# Patient Record
Sex: Male | Born: 1975 | Race: White | Hispanic: No | Marital: Married | Smoking: Never smoker
Health system: Southern US, Community
[De-identification: ages and names within clinical notes are randomized; demographics above are authoritative.]

## PROBLEM LIST (undated history)

## (undated) DIAGNOSIS — R519 Headache, unspecified: Secondary | ICD-10-CM

## (undated) DIAGNOSIS — G4730 Sleep apnea, unspecified: Secondary | ICD-10-CM

## (undated) DIAGNOSIS — K760 Fatty (change of) liver, not elsewhere classified: Secondary | ICD-10-CM

## (undated) DIAGNOSIS — M722 Plantar fascial fibromatosis: Secondary | ICD-10-CM

## (undated) DIAGNOSIS — Z87442 Personal history of urinary calculi: Secondary | ICD-10-CM

## (undated) DIAGNOSIS — F32A Depression, unspecified: Secondary | ICD-10-CM

## (undated) DIAGNOSIS — Z973 Presence of spectacles and contact lenses: Secondary | ICD-10-CM

## (undated) DIAGNOSIS — K219 Gastro-esophageal reflux disease without esophagitis: Secondary | ICD-10-CM

## (undated) DIAGNOSIS — N200 Calculus of kidney: Secondary | ICD-10-CM

## (undated) DIAGNOSIS — F329 Major depressive disorder, single episode, unspecified: Secondary | ICD-10-CM

## (undated) DIAGNOSIS — F319 Bipolar disorder, unspecified: Secondary | ICD-10-CM

## (undated) HISTORY — DX: Depression, unspecified: F32.A

## (undated) HISTORY — DX: Plantar fascial fibromatosis: M72.2

## (undated) HISTORY — DX: Calculus of kidney: N20.0

## (undated) HISTORY — DX: Major depressive disorder, single episode, unspecified: F32.9

## (undated) HISTORY — DX: Fatty (change of) liver, not elsewhere classified: K76.0

## (undated) HISTORY — PX: WISDOM TOOTH EXTRACTION: SHX21

## (undated) HISTORY — DX: Bipolar disorder, unspecified: F31.9

## (undated) HISTORY — PX: COLONOSCOPY: SHX174

---

## 2006-07-19 ENCOUNTER — Emergency Department: Payer: Self-pay | Admitting: Emergency Medicine

## 2006-07-20 ENCOUNTER — Emergency Department: Payer: Self-pay | Admitting: Emergency Medicine

## 2006-07-25 ENCOUNTER — Emergency Department: Payer: Self-pay | Admitting: Emergency Medicine

## 2009-11-03 ENCOUNTER — Ambulatory Visit: Payer: Self-pay | Admitting: Gastroenterology

## 2011-07-15 ENCOUNTER — Ambulatory Visit: Payer: Self-pay | Admitting: Family Medicine

## 2012-04-22 ENCOUNTER — Emergency Department: Payer: Self-pay | Admitting: *Deleted

## 2012-04-22 LAB — CBC
HCT: 46.5 % (ref 40.0–52.0)
HGB: 15.9 g/dL (ref 13.0–18.0)
MCH: 29.5 pg (ref 26.0–34.0)
MCV: 86 fL (ref 80–100)
RBC: 5.38 10*6/uL (ref 4.40–5.90)
RDW: 12.8 % (ref 11.5–14.5)
WBC: 11.4 10*3/uL — ABNORMAL HIGH (ref 3.8–10.6)

## 2012-04-22 LAB — BASIC METABOLIC PANEL
BUN: 11 mg/dL (ref 7–18)
Calcium, Total: 8.6 mg/dL (ref 8.5–10.1)
Creatinine: 0.94 mg/dL (ref 0.60–1.30)
EGFR (African American): >60
EGFR (Non-African Amer.): >60
Osmolality: 280 (ref 275–301)
Potassium: 3.8 mmol/L (ref 3.5–5.1)

## 2012-04-22 LAB — TROPONIN I: Troponin-I: <0.02 ng/mL

## 2012-04-22 LAB — CK TOTAL AND CKMB (NOT AT ARMC): CK-MB: 1.1 ng/mL (ref 0.5–3.6)

## 2012-04-23 LAB — HEPATIC FUNCTION PANEL A (ARMC)
Albumin: 4.3 g/dL (ref 3.4–5.0)
Bilirubin,Total: 0.7 mg/dL (ref 0.2–1.0)
SGOT(AST): 42 U/L — ABNORMAL HIGH (ref 15–37)
Total Protein: 7.5 g/dL (ref 6.4–8.2)

## 2012-04-23 LAB — CK TOTAL AND CKMB (NOT AT ARMC): CK, Total: 189 U/L (ref 35–232)

## 2012-04-23 LAB — TROPONIN I: Troponin-I: <0.02 ng/mL

## 2012-08-08 ENCOUNTER — Ambulatory Visit: Payer: Self-pay | Admitting: Orthopedic Surgery

## 2012-10-14 ENCOUNTER — Emergency Department: Payer: Self-pay | Admitting: Internal Medicine

## 2012-10-14 LAB — CBC
HCT: 43 % (ref 40.0–52.0)
HGB: 15.9 g/dL (ref 13.0–18.0)
MCH: 31.6 pg (ref 26.0–34.0)
MCHC: 36.8 g/dL — ABNORMAL HIGH (ref 32.0–36.0)
MCV: 85 fL (ref 80–100)
RBC: 5.04 10*6/uL (ref 4.40–5.90)
RDW: 13.1 % (ref 11.5–14.5)

## 2012-10-14 LAB — BASIC METABOLIC PANEL
Anion Gap: 9 (ref 7–16)
BUN: 23 mg/dL — ABNORMAL HIGH (ref 7–18)
Calcium, Total: 8.7 mg/dL (ref 8.5–10.1)
Creatinine: 1.11 mg/dL (ref 0.60–1.30)
EGFR (African American): >60
EGFR (Non-African Amer.): >60
Glucose: 115 mg/dL — ABNORMAL HIGH (ref 65–99)
Osmolality: 282 (ref 275–301)

## 2012-10-14 LAB — URINALYSIS, COMPLETE
Bilirubin,UR: NEGATIVE
Ketone: NEGATIVE
Leukocyte Esterase: NEGATIVE
Nitrite: NEGATIVE
Protein: NEGATIVE
RBC,UR: 4 /HPF (ref 0–5)
Specific Gravity: 1.021 (ref 1.003–1.030)

## 2012-11-09 ENCOUNTER — Ambulatory Visit: Payer: Self-pay | Admitting: Urology

## 2012-12-20 DIAGNOSIS — R39198 Other difficulties with micturition: Secondary | ICD-10-CM | POA: Insufficient documentation

## 2012-12-20 DIAGNOSIS — R3911 Hesitancy of micturition: Secondary | ICD-10-CM | POA: Insufficient documentation

## 2013-04-12 ENCOUNTER — Ambulatory Visit: Payer: Self-pay | Admitting: Orthopedic Surgery

## 2013-12-26 ENCOUNTER — Ambulatory Visit: Payer: Self-pay | Admitting: Physician Assistant

## 2015-04-11 DIAGNOSIS — N5082 Scrotal pain: Secondary | ICD-10-CM | POA: Insufficient documentation

## 2015-04-23 ENCOUNTER — Other Ambulatory Visit: Payer: Self-pay | Admitting: Urology

## 2015-04-23 DIAGNOSIS — N2889 Other specified disorders of kidney and ureter: Secondary | ICD-10-CM

## 2015-05-02 ENCOUNTER — Ambulatory Visit
Admission: RE | Admit: 2015-05-02 | Discharge: 2015-05-02 | Disposition: A | Discharge status: Home / Self Care | Payer: BLUE CROSS/BLUE SHIELD | Source: Ambulatory Visit | Attending: Urology | Admitting: Urology

## 2015-05-02 DIAGNOSIS — N281 Cyst of kidney, acquired: Secondary | ICD-10-CM | POA: Diagnosis not present

## 2015-05-02 DIAGNOSIS — N2889 Other specified disorders of kidney and ureter: Secondary | ICD-10-CM

## 2015-05-02 MED ORDER — IOHEXOL 350 MG/ML SOLN
125.0000 mL | Freq: Once | INTRAVENOUS | Status: AC | PRN
  Administered 2015-05-02: 125 mL via INTRAVENOUS

## 2015-06-22 ENCOUNTER — Other Ambulatory Visit: Payer: Self-pay | Admitting: Family Medicine

## 2015-11-04 ENCOUNTER — Ambulatory Visit (INDEPENDENT_AMBULATORY_CARE_PROVIDER_SITE_OTHER): Payer: BLUE CROSS/BLUE SHIELD | Admitting: Family Medicine

## 2015-11-04 ENCOUNTER — Encounter: Payer: Self-pay | Admitting: Family Medicine

## 2015-11-04 VITALS — BP 114/75 | HR 70 | Temp 98.2°F | Ht 68.2 in | Wt 195.0 lb

## 2015-11-04 DIAGNOSIS — N200 Calculus of kidney: Secondary | ICD-10-CM | POA: Diagnosis not present

## 2015-11-04 DIAGNOSIS — Z113 Encounter for screening for infections with a predominantly sexual mode of transmission: Secondary | ICD-10-CM

## 2015-11-04 DIAGNOSIS — Z23 Encounter for immunization: Secondary | ICD-10-CM

## 2015-11-04 DIAGNOSIS — Z0000 Encounter for general adult medical examination without abnormal findings: Secondary | ICD-10-CM | POA: Diagnosis not present

## 2015-11-04 DIAGNOSIS — F329 Major depressive disorder, single episode, unspecified: Secondary | ICD-10-CM | POA: Diagnosis not present

## 2015-11-04 DIAGNOSIS — F32A Depression, unspecified: Secondary | ICD-10-CM | POA: Insufficient documentation

## 2015-11-04 LAB — URINALYSIS, ROUTINE W REFLEX MICROSCOPIC
Bilirubin, UA: NEGATIVE
Glucose, UA: NEGATIVE
KETONES UA: NEGATIVE
Leukocytes, UA: NEGATIVE
NITRITE UA: NEGATIVE
Protein, UA: NEGATIVE
RBC, UA: NEGATIVE
SPEC GRAV UA: 1.025 (ref 1.005–1.030)
UUROB: 0.2 mg/dL (ref 0.2–1.0)
pH, UA: 6.5 (ref 5.0–7.5)

## 2015-11-04 MED ORDER — SUMATRIPTAN SUCCINATE 25 MG PO TABS: 25.0000 mg | ORAL_TABLET | ORAL | Status: DC | PRN

## 2015-11-04 MED ORDER — OMEPRAZOLE 20 MG PO CPDR: 20.0000 mg | DELAYED_RELEASE_CAPSULE | Freq: Every day | ORAL | Status: DC

## 2015-11-04 MED ORDER — MELOXICAM 15 MG PO TABS: 15.0000 mg | ORAL_TABLET | Freq: Every day | ORAL | Status: DC

## 2015-11-04 MED ORDER — OXYCODONE-ACETAMINOPHEN 5-325 MG PO TABS: 1.0000 | ORAL_TABLET | ORAL | Status: DC | PRN

## 2015-11-04 MED ORDER — CLONAZEPAM 1 MG PO TABS: 1.0000 mg | ORAL_TABLET | Freq: Two times a day (BID) | ORAL | Status: DC | PRN

## 2015-11-04 MED ORDER — AZELASTINE HCL 0.05 % OP SOLN: OPHTHALMIC | Status: DC

## 2015-11-04 NOTE — Addendum Note (Signed)
Addended by: Lurlean HornsWILSON, NANCY H on: 11/04/2015 02:54 PM   Modules accepted: Orders, SmartSet

## 2015-11-04 NOTE — Assessment & Plan Note (Signed)
Patient will have Percocet on hand in case gets a flare of his kidney stones

## 2015-11-04 NOTE — Progress Notes (Signed)
BP 114/75 mmHg  Pulse 70  Temp(Src) 98.2 F (36.8 C)  Ht 5' 8.2" (1.732 m)  Wt 195 lb (88.451 kg)  BMI 29.49 kg/m2  SpO2 97%   Subjective:    Patient ID: Lance Walsh, male    DOB: 1976-04-15, 39 y.o.   MRN: [id number]  HPI: Lance Walsh is a 39 y.o. male  Chief Complaint  Patient presents with  . Annual Exam   patient all in all doing well having some anxiety concerns takes occasional tramadol for when his back hurts along with meloxicam and has run out of meloxicam not tramadol Imitrex does pretty decent for headaches Takes omeprazole for reflux doing okay Patient has a few Klonopin left over takes it rarely but seems to help.   Relevant past medical, surgical, family and social history reviewed and updated as indicated. Interim medical history since our last visit reviewed. Allergies and medications reviewed and updated.  Review of Systems  Constitutional: Negative.   HENT: Negative.   Eyes: Negative.   Respiratory: Negative.   Cardiovascular: Negative.   Gastrointestinal: Negative.   Endocrine: Negative.   Genitourinary: Negative.   Musculoskeletal: Negative.   Skin: Negative.   Allergic/Immunologic: Negative.   Neurological: Negative.   Hematological: Negative.   Psychiatric/Behavioral: Negative.     Per HPI unless specifically indicated above     Objective:    BP 114/75 mmHg  Pulse 70  Temp(Src) 98.2 F (36.8 C)  Ht 5' 8.2" (1.732 m)  Wt 195 lb (88.451 kg)  BMI 29.49 kg/m2  SpO2 97%  Wt Readings from Last 3 Encounters:  11/04/15 195 lb (88.451 kg)  10/17/14 187 lb (84.823 kg)    Physical Exam  Constitutional: He is oriented to person, place, and time. He appears well-developed and well-nourished.  HENT:  Head: Normocephalic.  Right Ear: External ear normal.  Left Ear: External ear normal.  Nose: Nose normal.  Eyes: Conjunctivae and EOM are normal. Pupils are equal, round, and reactive to light.  Neck: Normal range of motion. Neck supple.  No thyromegaly present.  Cardiovascular: Normal rate, regular rhythm, normal heart sounds and intact distal pulses.   Pulmonary/Chest: Effort normal and breath sounds normal.  Abdominal: Soft. Bowel sounds are normal. There is no splenomegaly or hepatomegaly.  Genitourinary: Penis normal.  Musculoskeletal: Normal range of motion.  Lymphadenopathy:    He has no cervical adenopathy.  Neurological: He is alert and oriented to person, place, and time. He has normal reflexes.  Skin: Skin is warm and dry.  Psychiatric: He has a normal mood and affect. His behavior is normal. Judgment and thought content normal.    Results for orders placed or performed in visit on 67/89/38  Basic metabolic panel  Result Value Ref Range   Glucose 115 (H) 65-99 mg/dL   BUN 23 (H) 7-18 mg/dL   Creatinine 1.11 0.60-1.30 mg/dL   Sodium 139 136-145 mmol/L   Potassium 3.8 3.5-5.1 mmol/L   Chloride 106 98-107 mmol/L   Co2 24 21-32 mmol/L   Calcium, Total 8.7 8.5-10.1 mg/dL   Osmolality 282 275-301   Anion Gap 9 7-16   EGFR (African American) >60    EGFR (Non-African Amer.) >60   CBC  Result Value Ref Range   WBC 14.8 (H) 3.8-10.6 x10 3/mm 3   RBC 5.04 4.40-5.90 x10 6/mm 3   HGB 15.9 13.0-18.0 g/dL   HCT 43.0 40.0-52.0 %   MCV 85 80-100 fL   MCH 31.6 26.0-34.0 pg  MCHC 36.8 (H) 32.0-36.0 g/dL   RDW 13.1 11.5-14.5 %   Platelet 230 150-440 x10 3/mm 3  Urinalysis, Complete  Result Value Ref Range   Color - urine Yellow    Clarity - urine Clear    Glucose,UR Negative 0-75 mg/dL   Bilirubin,UR Negative NEGATIVE   Ketone Negative NEGATIVE   Specific Gravity 1.021 1.003-1.030   Blood 2+ NEGATIVE   Ph 6.0 4.5-8.0   Protein Negative NEGATIVE   Nitrite Negative NEGATIVE   Leukocyte Esterase Negative NEGATIVE   RBC,UR 4 /HPF 0-5 /HPF   WBC UR <1 /HPF 0-5 /HPF   Bacteria TRACE NONE SEEN   Squamous Epithelial <1 /HPF    Mucous PRESENT       Assessment & Plan:   Problem List Items Addressed This  Visit      Genitourinary   Calculus of kidney    Patient will have Percocet on hand in case gets a flare of his kidney stones      Relevant Medications   oxyCODONE-acetaminophen (PERCOCET/ROXICET) 5-325 MG tablet     Other   Depression    Other Visit Diagnoses    Routine general medical examination at a health care facility    -  Primary    Relevant Orders    CBC with Differential/Platelet    Comprehensive metabolic panel    Lipid Panel w/o Chol/HDL Ratio    TSH    Urinalysis, Routine w reflex microscopic (not at [facility])    Routine screening for STI (sexually transmitted infection)        Relevant Orders    HIV antibody        Follow up plan: Return in about 6 months (around 05/04/2016) for Med check follow-up.

## 2015-11-05 ENCOUNTER — Telehealth: Payer: Self-pay | Admitting: Family Medicine

## 2015-11-05 LAB — COMPREHENSIVE METABOLIC PANEL
A/G RATIO: 2.2 (ref 1.1–2.5)
ALT: 24 IU/L (ref 0–44)
AST: 19 IU/L (ref 0–40)
Albumin: 4.7 g/dL (ref 3.5–5.5)
Alkaline Phosphatase: 55 IU/L (ref 39–117)
BUN/Creatinine Ratio: 17 (ref 8–19)
BUN: 15 mg/dL (ref 6–20)
Bilirubin Total: 0.7 mg/dL (ref 0.0–1.2)
CALCIUM: 9.4 mg/dL (ref 8.7–10.2)
CO2: 27 mmol/L (ref 18–29)
Chloride: 100 mmol/L (ref 97–106)
Creatinine, Ser: 0.88 mg/dL (ref 0.76–1.27)
GFR calc non Af Amer: 108 mL/min/{1.73_m2} (ref >59)
GFR, EST AFRICAN AMERICAN: 125 mL/min/{1.73_m2} (ref >59)
Globulin, Total: 2.1 g/dL (ref 1.5–4.5)
Glucose: 88 mg/dL (ref 65–99)
POTASSIUM: 4.4 mmol/L (ref 3.5–5.2)
Sodium: 140 mmol/L (ref 136–144)
TOTAL PROTEIN: 6.8 g/dL (ref 6.0–8.5)

## 2015-11-05 LAB — CBC WITH DIFFERENTIAL/PLATELET
BASOS: 0 %
Basophils Absolute: 0 10*3/uL (ref 0.0–0.2)
EOS (ABSOLUTE): 0.2 10*3/uL (ref 0.0–0.4)
EOS: 3 %
HEMATOCRIT: 44.5 % (ref 37.5–51.0)
Hemoglobin: 15.3 g/dL (ref 12.6–17.7)
IMMATURE GRANULOCYTES: 0 %
Immature Grans (Abs): 0 10*3/uL (ref 0.0–0.1)
Lymphocytes Absolute: 2.5 10*3/uL (ref 0.7–3.1)
Lymphs: 34 %
MCH: 29.4 pg (ref 26.6–33.0)
MCHC: 34.4 g/dL (ref 31.5–35.7)
MCV: 85 fL (ref 79–97)
Monocytes Absolute: 0.6 10*3/uL (ref 0.1–0.9)
Monocytes: 8 %
NEUTROS PCT: 55 %
Neutrophils Absolute: 3.9 10*3/uL (ref 1.4–7.0)
Platelets: 263 10*3/uL (ref 150–379)
RBC: 5.21 x10E6/uL (ref 4.14–5.80)
RDW: 13 % (ref 12.3–15.4)
WBC: 7.3 10*3/uL (ref 3.4–10.8)

## 2015-11-05 LAB — LIPID PANEL W/O CHOL/HDL RATIO
Cholesterol, Total: 234 mg/dL — ABNORMAL HIGH (ref 100–199)
HDL: 52 mg/dL (ref >39)
LDL Calculated: 166 mg/dL — ABNORMAL HIGH (ref 0–99)
Triglycerides: 80 mg/dL (ref 0–149)
VLDL CHOLESTEROL CAL: 16 mg/dL (ref 5–40)

## 2015-11-05 LAB — HIV ANTIBODY (ROUTINE TESTING W REFLEX): HIV SCREEN 4TH GENERATION: NONREACTIVE

## 2015-11-05 LAB — TSH: TSH: 2.11 u[IU]/mL (ref 0.450–4.500)

## 2015-11-05 NOTE — Telephone Encounter (Signed)
Phone call Discussed with patient elevated Konrad DoloresLester all patient will do better with diet and exercise nutrition

## 2015-11-05 NOTE — Telephone Encounter (Signed)
-----   Message from Lurlean HornsNancy H Wilson, CMA sent at 11/05/2015  5:12 PM EST ----- labs

## 2016-01-05 ENCOUNTER — Other Ambulatory Visit: Payer: Self-pay | Admitting: Family Medicine

## 2016-01-05 ENCOUNTER — Other Ambulatory Visit: Payer: Self-pay

## 2016-01-05 MED ORDER — TRAMADOL HCL 50 MG PO TABS: ORAL_TABLET | ORAL | Status: DC

## 2016-01-05 NOTE — Telephone Encounter (Signed)
[facility] requesting refill

## 2016-01-16 ENCOUNTER — Telehealth: Payer: Self-pay | Admitting: Family Medicine

## 2016-01-16 NOTE — Telephone Encounter (Signed)
Pt needs a new rx for imitrex and percocet. He stated that the dosage was wrond and would like a call back or have it corrected and sent to [facility] court.

## 2016-01-19 MED ORDER — SUMATRIPTAN SUCCINATE 100 MG PO TABS: 100.0000 mg | ORAL_TABLET | ORAL | Status: DC | PRN

## 2016-01-19 NOTE — Telephone Encounter (Signed)
Call pt 

## 2016-02-06 ENCOUNTER — Other Ambulatory Visit: Payer: Self-pay | Admitting: Family Medicine

## 2016-02-09 ENCOUNTER — Other Ambulatory Visit: Payer: Self-pay | Admitting: Family Medicine

## 2016-02-09 NOTE — Telephone Encounter (Signed)
Call tell too soon for tramadol refill should last several mo. Use Tylenol, Advil, or Aleve

## 2016-05-04 ENCOUNTER — Ambulatory Visit: Payer: BLUE CROSS/BLUE SHIELD | Admitting: Family Medicine

## 2016-06-18 ENCOUNTER — Other Ambulatory Visit: Payer: Self-pay | Admitting: Family Medicine

## 2016-06-18 NOTE — Telephone Encounter (Signed)
Gave enough to get him through until Dr. Dossie Arbourrissman comes back

## 2016-08-26 ENCOUNTER — Other Ambulatory Visit: Payer: Self-pay

## 2016-08-26 MED ORDER — MELOXICAM 15 MG PO TABS: 15.0000 mg | ORAL_TABLET | Freq: Every day | ORAL | 0 refills | Status: DC

## 2016-10-05 ENCOUNTER — Encounter: Payer: Self-pay | Admitting: Family Medicine

## 2016-10-05 ENCOUNTER — Ambulatory Visit (INDEPENDENT_AMBULATORY_CARE_PROVIDER_SITE_OTHER): Payer: BLUE CROSS/BLUE SHIELD | Admitting: Family Medicine

## 2016-10-05 VITALS — BP 101/69 | HR 67 | Temp 98.5°F | Wt 199.0 lb

## 2016-10-05 DIAGNOSIS — Z23 Encounter for immunization: Secondary | ICD-10-CM | POA: Diagnosis not present

## 2016-10-05 MED ORDER — EPINEPHRINE 0.3 MG/0.3ML IJ SOAJ: 0.3000 mg | Freq: Once | INTRAMUSCULAR | 2 refills | Status: AC

## 2016-10-05 MED ORDER — OMEPRAZOLE 20 MG PO CPDR: 20.0000 mg | DELAYED_RELEASE_CAPSULE | Freq: Two times a day (BID) | ORAL | 12 refills | Status: DC

## 2016-10-05 NOTE — Progress Notes (Signed)
BP 101/69   Pulse 67   Temp 98.5 F (36.9 C)   Wt 199 lb (90.3 kg)   SpO2 97%   BMI 30.08 kg/m    Subjective:    Patient ID: Lance Walsh, male    DOB: Sep 22, 1976, 40 y.o.   MRN: [id number]  HPI: Lance Championaul S Canedo is a 40 y.o. male  Chief Complaint  Patient presents with  . skin tag    right upper leg; has had similar ones in the past that were froze off   Reflux helped by taking Prilosec in the afternoons but does the best when he takes one in the morning and one in the evening. Wants to have medicine switch to that  Discussed pain management patient has used oxycodone in the past for intermittent kidney stones Reviewed with patient current climate and use of oxycodone patient will have to get any further prescriptions from urology Patient has substituted tramadol for kidney stones again will need tramadol prescriptions from urology He has used occasional tramadol for low back pain patient will need to avoid tramadol use meloxicam and Tylenol for back pain. If continued back pain may need further evaluation with orthopedics. Patient taking clonazepam rarely still has prescription left from last December Also needs refill on EpiPen Takes meloxicam which helps his fingers a great deal with mobility wants to continue. Migraine syndrome gotten worse still has meloxicam prescription Relevant past medical, surgical, family and social history reviewed and updated as indicated. Interim medical history since our last visit reviewed. Allergies and medications reviewed and updated.  Review of Systems  Constitutional: Negative.   Respiratory: Negative.   Cardiovascular: Negative.     Per HPI unless specifically indicated above     Objective:    BP 101/69   Pulse 67   Temp 98.5 F (36.9 C)   Wt 199 lb (90.3 kg)   SpO2 97%   BMI 30.08 kg/m   Wt Readings from Last 3 Encounters:  10/05/16 199 lb (90.3 kg)  11/04/15 195 lb (88.5 kg)  10/17/14 187 lb (84.8 kg)    Physical  Exam  Constitutional: He is oriented to person, place, and time. He appears well-developed and well-nourished. No distress.  HENT:  Head: Normocephalic and atraumatic.  Right Ear: Hearing normal.  Left Ear: Hearing normal.  Nose: Nose normal.  Eyes: Conjunctivae and lids are normal. Right eye exhibits no discharge. Left eye exhibits no discharge. No scleral icterus.  Cardiovascular: Normal rate, regular rhythm and normal heart sounds.   Pulmonary/Chest: Effort normal and breath sounds normal. No respiratory distress.  Musculoskeletal: Normal range of motion.  Neurological: He is alert and oriented to person, place, and time.  Skin: Skin is intact. No rash noted.  Psychiatric: He has a normal mood and affect. His speech is normal and behavior is normal. Judgment and thought content normal. Cognition and memory are normal.    Results for orders placed or performed in visit on 11/04/15  CBC with Differential/Platelet  Result Value Ref Range   WBC 7.3 3.4 - 10.8 x10E3/uL   RBC 5.21 4.14 - 5.80 x10E6/uL   Hemoglobin 15.3 12.6 - 17.7 g/dL   Hematocrit 13.044.5 86.537.5 - 51.0 %   MCV 85 79 - 97 fL   MCH 29.4 26.6 - 33.0 pg   MCHC 34.4 31.5 - 35.7 g/dL   RDW 78.413.0 69.612.3 - 29.515.4 %   Platelets 263 150 - 379 x10E3/uL   Neutrophils 55 %   Lymphs 34 %  Monocytes 8 %   Eos 3 %   Basos 0 %   Neutrophils Absolute 3.9 1.4 - 7.0 x10E3/uL   Lymphocytes Absolute 2.5 0.7 - 3.1 x10E3/uL   Monocytes Absolute 0.6 0.1 - 0.9 x10E3/uL   EOS (ABSOLUTE) 0.2 0.0 - 0.4 x10E3/uL   Basophils Absolute 0.0 0.0 - 0.2 x10E3/uL   Immature Granulocytes 0 %   Immature Grans (Abs) 0.0 0.0 - 0.1 x10E3/uL  Comprehensive metabolic panel  Result Value Ref Range   Glucose 88 65 - 99 mg/dL   BUN 15 6 - 20 mg/dL   Creatinine, Ser 1.610.88 0.76 - 1.27 mg/dL   GFR calc non Af Amer 108 >59 mL/min/1.73   GFR calc Af Amer 125 >59 mL/min/1.73   BUN/Creatinine Ratio 17 8 - 19   Sodium 140 136 - 144 mmol/L   Potassium 4.4 3.5 - 5.2  mmol/L   Chloride 100 97 - 106 mmol/L   CO2 27 18 - 29 mmol/L   Calcium 9.4 8.7 - 10.2 mg/dL   Total Protein 6.8 6.0 - 8.5 g/dL   Albumin 4.7 3.5 - 5.5 g/dL   Globulin, Total 2.1 1.5 - 4.5 g/dL   Albumin/Globulin Ratio 2.2 1.1 - 2.5   Bilirubin Total 0.7 0.0 - 1.2 mg/dL   Alkaline Phosphatase 55 39 - 117 IU/L   AST 19 0 - 40 IU/L   ALT 24 0 - 44 IU/L  Lipid Panel w/o Chol/HDL Ratio  Result Value Ref Range   Cholesterol, Total 234 (H) 100 - 199 mg/dL   Triglycerides 80 0 - 149 mg/dL   HDL 52 >09>39 mg/dL   VLDL Cholesterol Cal 16 5 - 40 mg/dL   LDL Calculated 604166 (H) 0 - 99 mg/dL  TSH  Result Value Ref Range   TSH 2.110 0.450 - 4.500 uIU/mL  Urinalysis, Routine w reflex microscopic (not at [facility])  Result Value Ref Range   Specific Gravity, UA 1.025 1.005 - 1.030   pH, UA 6.5 5.0 - 7.5   Color, UA Yellow Yellow   Appearance Ur Clear Clear   Leukocytes, UA Negative Negative   Protein, UA Negative Negative/Trace   Glucose, UA Negative Negative   Ketones, UA Negative Negative   RBC, UA Negative Negative   Bilirubin, UA Negative Negative   Urobilinogen, Ur 0.2 0.2 - 1.0 mg/dL   Nitrite, UA Negative Negative  HIV antibody  Result Value Ref Range   HIV Screen 4th Generation wRfx Non Reactive Non Reactive      Assessment & Plan:   Problem List Items Addressed This Visit    None    Visit Diagnoses    Needs flu shot    -  Primary       Follow up plan: Return in about 3 months (around 01/05/2017) for Physical Exam.

## 2016-10-08 ENCOUNTER — Other Ambulatory Visit: Payer: Self-pay | Admitting: Family Medicine

## 2016-10-08 NOTE — Telephone Encounter (Signed)
Your patient 

## 2016-12-22 ENCOUNTER — Encounter: Payer: BLUE CROSS/BLUE SHIELD | Admitting: Family Medicine

## 2016-12-29 ENCOUNTER — Ambulatory Visit (INDEPENDENT_AMBULATORY_CARE_PROVIDER_SITE_OTHER): Payer: BLUE CROSS/BLUE SHIELD | Admitting: Family Medicine

## 2016-12-29 ENCOUNTER — Encounter: Payer: Self-pay | Admitting: Family Medicine

## 2016-12-29 VITALS — BP 122/84 | HR 85 | Temp 98.0°F | Ht 70.0 in | Wt 202.0 lb

## 2016-12-29 DIAGNOSIS — Z23 Encounter for immunization: Secondary | ICD-10-CM

## 2016-12-29 DIAGNOSIS — G43909 Migraine, unspecified, not intractable, without status migrainosus: Secondary | ICD-10-CM

## 2016-12-29 DIAGNOSIS — Z13220 Encounter for screening for lipoid disorders: Secondary | ICD-10-CM | POA: Diagnosis not present

## 2016-12-29 DIAGNOSIS — Z0000 Encounter for general adult medical examination without abnormal findings: Secondary | ICD-10-CM

## 2016-12-29 DIAGNOSIS — N200 Calculus of kidney: Secondary | ICD-10-CM

## 2016-12-29 DIAGNOSIS — Z1329 Encounter for screening for other suspected endocrine disorder: Secondary | ICD-10-CM | POA: Diagnosis not present

## 2016-12-29 DIAGNOSIS — F3342 Major depressive disorder, recurrent, in full remission: Secondary | ICD-10-CM | POA: Diagnosis not present

## 2016-12-29 DIAGNOSIS — R5383 Other fatigue: Secondary | ICD-10-CM | POA: Insufficient documentation

## 2016-12-29 DIAGNOSIS — Z125 Encounter for screening for malignant neoplasm of prostate: Secondary | ICD-10-CM | POA: Diagnosis not present

## 2016-12-29 LAB — URINALYSIS, ROUTINE W REFLEX MICROSCOPIC
Bilirubin, UA: NEGATIVE
GLUCOSE, UA: NEGATIVE
Leukocytes, UA: NEGATIVE
Nitrite, UA: NEGATIVE
RBC UA: NEGATIVE
SPEC GRAV UA: 1.02 (ref 1.005–1.030)
Urobilinogen, Ur: 0.2 mg/dL (ref 0.2–1.0)
pH, UA: 7.5 (ref 5.0–7.5)

## 2016-12-29 MED ORDER — SUMATRIPTAN SUCCINATE 100 MG PO TABS: 100.0000 mg | ORAL_TABLET | ORAL | 12 refills | Status: DC | PRN

## 2016-12-29 MED ORDER — MELOXICAM 15 MG PO TABS: 15.0000 mg | ORAL_TABLET | Freq: Every day | ORAL | 3 refills | Status: DC

## 2016-12-29 MED ORDER — TRAZODONE HCL 50 MG PO TABS: 25.0000 mg | ORAL_TABLET | Freq: Every evening | ORAL | 3 refills | Status: DC | PRN

## 2016-12-29 NOTE — Progress Notes (Signed)
BP 122/84   Pulse 85   Temp 98 F (36.7 C) (Oral)   Ht 5\' 10"  (1.778 m)   Wt 202 lb (91.6 kg)   SpO2 99%   BMI 28.98 kg/m    Subjective:    Patient ID: Lance Walsh, male    DOB: 11/17/1976, 41 y.o.   MRN: [id number]  HPI: Lance Walsh is a 41 y.o. male  Chief Complaint  Patient presents with  . Annual Exam  . Fatigue   Fatigue Over the last year more tired than usual. More tired than those around him. Sleeps around 6 hours a night usually. Nyquil etc doesn't work. Snores. Has had a sleep study done a few years ago in the past without any findings. Never feels very rested in the morning gets going and feels okay - then gets tired again in the afternoon. Taking more naps after work which is unusual. Falls asleep right away in the evening. Once in a while feels very energized but cant tie it to anything making better or worse. Not sleepy but fatigued.  Kidney Calculi States he has been doing better. Reports he has modified his diet, has cut out caffeine and is drinking mostly only water now. Passing about 1 kidney calculi a month, but sometimes can go a month without one. Takes tramadol for this.  Migraines Patient states that his migraines have worsened over the past 6-8 months. Notes that previously he was only experiencing 1 minor migraine every couple weeks, but is now getting more severe migraines 2-3 times a week. Reports that he takes sumatriptan and BC powder without much relief.  Notes they have gotten worse over the last 6-8 months - 2-3 times a week - one usually lasts longer the others a couple longer - hot showers help - sumatriptan helps but takes a while  - cold rags help - light and noise makes worse  Depression Doing well - takes for stressful situations - gets irritated and agitated in these situations - takes klonapin every once in a while (once every couple weeks)  Chest Pain Sometimes dull, sometimes sharp - doesn't notice it happening at at certain times -  sometimes really fast sometimes lasts for 20-30 minutes - about 1 every couple weeks or so  Joint Pain in both hands Takes meloxicam  Asthma - given albuterol inhaler from urgent care  Medications Tramadol - as needed - 3-4 days out of the month  - doesn't make him tired unless he takes it multiple times in a row Omeprazole - twice a day flonase and allegra every day meloxicam every day for joint paint in hands Sumatriptan when he has a migraine  Family History Same  No surgeries, hospitalizations since last time he was   Allergies - nothing new   Relevant past medical, surgical, family and social history reviewed and updated as indicated. Interim medical history since our last visit reviewed. Allergies and medications reviewed and updated.  Review of Systems  Constitutional: Positive for fatigue. Negative for appetite change, chills, diaphoresis, fever and unexpected weight change.  HENT: Negative.   Eyes: Negative.   Respiratory: Positive for shortness of breath. Negative for cough, chest tightness and wheezing.   Cardiovascular: Positive for chest pain. Negative for palpitations and leg swelling.  Gastrointestinal: Negative.   Endocrine: Negative.   Genitourinary: Negative.   Musculoskeletal: Positive for arthralgias. Negative for back pain and myalgias.  Skin: Negative.   Allergic/Immunologic: Positive for environmental allergies.  Neurological: Positive  for light-headedness and headaches. Negative for dizziness and syncope.       Light headed with migraines.  Hematological: Negative.   Psychiatric/Behavioral: Negative.     Per HPI unless specifically indicated above     Objective:    BP 122/84   Pulse 85   Temp 98 F (36.7 C) (Oral)   Ht 5\' 10"  (1.778 m)   Wt 202 lb (91.6 kg)   SpO2 99%   BMI 28.98 kg/m   Wt Readings from Last 3 Encounters:  12/29/16 202 lb (91.6 kg)  10/05/16 199 lb (90.3 kg)  11/04/15 195 lb (88.5 kg)    Physical Exam    Constitutional: He is oriented to person, place, and time. He appears well-developed and well-nourished.  HENT:  Head: Normocephalic and atraumatic.  Right Ear: External ear normal.  Left Ear: External ear normal.  Eyes: Conjunctivae and EOM are normal. Pupils are equal, round, and reactive to light.  Neck: Normal range of motion. Neck supple.  Cardiovascular: Normal rate, regular rhythm, normal heart sounds and intact distal pulses.   Pulmonary/Chest: Effort normal and breath sounds normal.  Abdominal: Soft. Bowel sounds are normal. There is no splenomegaly or hepatomegaly.  Genitourinary: Rectum normal, prostate normal and penis normal.  Musculoskeletal: Normal range of motion.  Neurological: He is alert and oriented to person, place, and time. He has normal reflexes.  Skin: No rash noted. No erythema.  Psychiatric: He has a normal mood and affect. His behavior is normal. Judgment and thought content normal.    Results for orders placed or performed in visit on 11/04/15  CBC with Differential/Platelet  Result Value Ref Range   WBC 7.3 3.4 - 10.8 x10E3/uL   RBC 5.21 4.14 - 5.80 x10E6/uL   Hemoglobin 15.3 12.6 - 17.7 g/dL   Hematocrit 16.1 09.6 - 51.0 %   MCV 85 79 - 97 fL   MCH 29.4 26.6 - 33.0 pg   MCHC 34.4 31.5 - 35.7 g/dL   RDW 04.5 40.9 - 81.1 %   Platelets 263 150 - 379 x10E3/uL   Neutrophils 55 %   Lymphs 34 %   Monocytes 8 %   Eos 3 %   Basos 0 %   Neutrophils Absolute 3.9 1.4 - 7.0 x10E3/uL   Lymphocytes Absolute 2.5 0.7 - 3.1 x10E3/uL   Monocytes Absolute 0.6 0.1 - 0.9 x10E3/uL   EOS (ABSOLUTE) 0.2 0.0 - 0.4 x10E3/uL   Basophils Absolute 0.0 0.0 - 0.2 x10E3/uL   Immature Granulocytes 0 %   Immature Grans (Abs) 0.0 0.0 - 0.1 x10E3/uL  Comprehensive metabolic panel  Result Value Ref Range   Glucose 88 65 - 99 mg/dL   BUN 15 6 - 20 mg/dL   Creatinine, Ser 9.14 0.76 - 1.27 mg/dL   GFR calc non Af Amer 108 >59 mL/min/1.73   GFR calc Af Amer 125 >59 mL/min/1.73    BUN/Creatinine Ratio 17 8 - 19   Sodium 140 136 - 144 mmol/L   Potassium 4.4 3.5 - 5.2 mmol/L   Chloride 100 97 - 106 mmol/L   CO2 27 18 - 29 mmol/L   Calcium 9.4 8.7 - 10.2 mg/dL   Total Protein 6.8 6.0 - 8.5 g/dL   Albumin 4.7 3.5 - 5.5 g/dL   Globulin, Total 2.1 1.5 - 4.5 g/dL   Albumin/Globulin Ratio 2.2 1.1 - 2.5   Bilirubin Total 0.7 0.0 - 1.2 mg/dL   Alkaline Phosphatase 55 39 - 117 IU/L   AST 19  0 - 40 IU/L   ALT 24 0 - 44 IU/L  Lipid Panel w/o Chol/HDL Ratio  Result Value Ref Range   Cholesterol, Total 234 (H) 100 - 199 mg/dL   Triglycerides 80 0 - 149 mg/dL   HDL 52 >81>39 mg/dL   VLDL Cholesterol Cal 16 5 - 40 mg/dL   LDL Calculated 191166 (H) 0 - 99 mg/dL  TSH  Result Value Ref Range   TSH 2.110 0.450 - 4.500 uIU/mL  Urinalysis, Routine w reflex microscopic (not at [facility])  Result Value Ref Range   Specific Gravity, UA 1.025 1.005 - 1.030   pH, UA 6.5 5.0 - 7.5   Color, UA Yellow Yellow   Appearance Ur Clear Clear   Leukocytes, UA Negative Negative   Protein, UA Negative Negative/Trace   Glucose, UA Negative Negative   Ketones, UA Negative Negative   RBC, UA Negative Negative   Bilirubin, UA Negative Negative   Urobilinogen, Ur 0.2 0.2 - 1.0 mg/dL   Nitrite, UA Negative Negative  HIV antibody  Result Value Ref Range   HIV Screen 4th Generation wRfx Non Reactive Non Reactive      Assessment & Plan:   Problem List Items Addressed This Visit      Cardiovascular and Mediastinum   Migraine headache    Discuss migraines and sleep deprivation will have very limited treatment until sleep deprivation does better.      Relevant Medications   meloxicam (MOBIC) 15 MG tablet   SUMAtriptan (IMITREX) 100 MG tablet     Genitourinary   Calculus of kidney    Followed by urology Discuss tramadol usage and limitations patient will limit use to only several a month patient takes for kidney stones.        Other   Depression    The current medical regimen is  effective;  continue present plan and medications.       Fatigue    Discuss fatigue and limited sleep patient with problems of getting to sleep once he sleeps can do okay on further review and maybe 5 5-1/2 hours of sleep spent going on long-term in many days less. Patient's diet may also be inadequate. Discussed to help with sleep will use tramadol      Relevant Orders   CBC with Differential/Platelet    Other Visit Diagnoses    Annual physical exam    -  Primary   Relevant Orders   Comprehensive metabolic panel   Lipid panel   Urinalysis, Routine w reflex microscopic   TSH   CBC with Differential/Platelet   Screening cholesterol level       Relevant Orders   Lipid panel   Prostate cancer screening       Thyroid disorder screen       Relevant Orders   TSH   Needs flu shot       Relevant Orders   Flu Vaccine QUAD 36+ mos PF IM (Fluarix & Fluzone Quad PF) (Completed)       Follow up plan: Return in about 3 months (around 03/28/2017) for Recheck multiple problems including hands.

## 2016-12-29 NOTE — Assessment & Plan Note (Addendum)
Followed by urology Discuss tramadol usage and limitations patient will limit use to only several a month patient takes for kidney stones.

## 2016-12-29 NOTE — Assessment & Plan Note (Addendum)
Discuss fatigue and limited sleep patient with problems of getting to sleep once he sleeps can do okay on further review and maybe 5 5-1/2 hours of sleep spent going on long-term in many days less. Patient's diet may also be inadequate. Discussed to help with sleep will use tramadol

## 2016-12-29 NOTE — Addendum Note (Signed)
Addended byVonita Moss: Halley Kincer on: 12/29/2016 03:12 PM   Modules accepted: Orders

## 2016-12-29 NOTE — Assessment & Plan Note (Signed)
Discuss migraines and sleep deprivation will have very limited treatment until sleep deprivation does better.

## 2016-12-29 NOTE — Assessment & Plan Note (Signed)
The current medical regimen is effective;  continue present plan and medications.  

## 2016-12-30 ENCOUNTER — Encounter: Payer: Self-pay | Admitting: Family Medicine

## 2016-12-30 LAB — CBC WITH DIFFERENTIAL/PLATELET
Basophils Absolute: 0 10*3/uL (ref 0.0–0.2)
Basos: 0 %
EOS (ABSOLUTE): 0.2 10*3/uL (ref 0.0–0.4)
EOS: 2 %
HEMATOCRIT: 43.8 % (ref 37.5–51.0)
HEMOGLOBIN: 15.3 g/dL (ref 13.0–17.7)
IMMATURE GRANULOCYTES: 0 %
Immature Grans (Abs): 0 10*3/uL (ref 0.0–0.1)
Lymphocytes Absolute: 2.7 10*3/uL (ref 0.7–3.1)
Lymphs: 30 %
MCH: 30.1 pg (ref 26.6–33.0)
MCHC: 34.9 g/dL (ref 31.5–35.7)
MCV: 86 fL (ref 79–97)
MONOCYTES: 8 %
Monocytes Absolute: 0.7 10*3/uL (ref 0.1–0.9)
NEUTROS PCT: 60 %
Neutrophils Absolute: 5.3 10*3/uL (ref 1.4–7.0)
Platelets: 280 10*3/uL (ref 150–379)
RBC: 5.09 x10E6/uL (ref 4.14–5.80)
RDW: 13.3 % (ref 12.3–15.4)
WBC: 8.9 10*3/uL (ref 3.4–10.8)

## 2016-12-30 LAB — COMPREHENSIVE METABOLIC PANEL
A/G RATIO: 2.1 (ref 1.2–2.2)
ALBUMIN: 4.6 g/dL (ref 3.5–5.5)
ALT: 27 IU/L (ref 0–44)
AST: 20 IU/L (ref 0–40)
Alkaline Phosphatase: 50 IU/L (ref 39–117)
BILIRUBIN TOTAL: 0.5 mg/dL (ref 0.0–1.2)
BUN/Creatinine Ratio: 19 (ref 9–20)
BUN: 19 mg/dL (ref 6–24)
CHLORIDE: 101 mmol/L (ref 96–106)
CO2: 24 mmol/L (ref 18–29)
Calcium: 8.9 mg/dL (ref 8.7–10.2)
Creatinine, Ser: 0.98 mg/dL (ref 0.76–1.27)
GFR calc non Af Amer: 96 mL/min/{1.73_m2} (ref >59)
GFR, EST AFRICAN AMERICAN: 111 mL/min/{1.73_m2} (ref >59)
GLUCOSE: 85 mg/dL (ref 65–99)
Globulin, Total: 2.2 g/dL (ref 1.5–4.5)
Potassium: 4 mmol/L (ref 3.5–5.2)
Sodium: 140 mmol/L (ref 134–144)
TOTAL PROTEIN: 6.8 g/dL (ref 6.0–8.5)

## 2016-12-30 LAB — LIPID PANEL
Chol/HDL Ratio: 3.7 ratio units (ref 0.0–5.0)
Cholesterol, Total: 224 mg/dL — ABNORMAL HIGH (ref 100–199)
HDL: 60 mg/dL (ref >39)
LDL Calculated: 143 mg/dL — ABNORMAL HIGH (ref 0–99)
Triglycerides: 103 mg/dL (ref 0–149)
VLDL CHOLESTEROL CAL: 21 mg/dL (ref 5–40)

## 2016-12-30 LAB — TSH: TSH: 1.33 u[IU]/mL (ref 0.450–4.500)

## 2017-01-04 ENCOUNTER — Other Ambulatory Visit: Payer: Self-pay | Admitting: Family Medicine

## 2017-01-04 NOTE — Telephone Encounter (Signed)
Routing to provider. Appt 03/21/17.

## 2017-02-08 ENCOUNTER — Other Ambulatory Visit: Payer: Self-pay

## 2017-02-08 MED ORDER — AZELASTINE HCL 0.05 % OP SOLN: OPHTHALMIC | 12 refills | Status: DC

## 2017-02-08 NOTE — Telephone Encounter (Signed)
Last routine OV: 12/29/16 Next OV: 03/21/17

## 2017-03-21 ENCOUNTER — Ambulatory Visit: Payer: BLUE CROSS/BLUE SHIELD | Admitting: Family Medicine

## 2017-11-30 ENCOUNTER — Other Ambulatory Visit: Payer: Self-pay

## 2018-04-17 ENCOUNTER — Other Ambulatory Visit: Payer: Self-pay | Admitting: Family Medicine

## 2018-04-18 NOTE — Telephone Encounter (Signed)
Please advise 

## 2018-04-18 NOTE — Telephone Encounter (Signed)
Refill request Imitrex pharmacy warning on dosage per Surescripts   LOV 12/29/2016 Dr Dossie Arbour  Last filled 12/29/2016 10 tabs   Pharmacy on file

## 2018-07-25 ENCOUNTER — Other Ambulatory Visit: Payer: Self-pay

## 2018-07-25 ENCOUNTER — Ambulatory Visit (INDEPENDENT_AMBULATORY_CARE_PROVIDER_SITE_OTHER): Payer: Managed Care, Other (non HMO) | Admitting: Family Medicine

## 2018-07-25 ENCOUNTER — Encounter: Payer: Self-pay | Admitting: Family Medicine

## 2018-07-25 VITALS — BP 104/66 | HR 76 | Temp 98.4°F | Ht 70.0 in | Wt 184.4 lb

## 2018-07-25 DIAGNOSIS — G43909 Migraine, unspecified, not intractable, without status migrainosus: Secondary | ICD-10-CM

## 2018-07-25 DIAGNOSIS — Z23 Encounter for immunization: Secondary | ICD-10-CM | POA: Diagnosis not present

## 2018-07-25 DIAGNOSIS — S161XXA Strain of muscle, fascia and tendon at neck level, initial encounter: Secondary | ICD-10-CM

## 2018-07-25 DIAGNOSIS — K2900 Acute gastritis without bleeding: Secondary | ICD-10-CM

## 2018-07-25 MED ORDER — SUMATRIPTAN SUCCINATE 100 MG PO TABS: 100.0000 mg | ORAL_TABLET | ORAL | 3 refills | Status: DC | PRN

## 2018-07-25 MED ORDER — CYCLOBENZAPRINE HCL 10 MG PO TABS: 10.0000 mg | ORAL_TABLET | Freq: Three times a day (TID) | ORAL | 0 refills | Status: DC | PRN

## 2018-07-25 MED ORDER — SUCRALFATE 1 G PO TABS: 1.0000 g | ORAL_TABLET | Freq: Three times a day (TID) | ORAL | 1 refills | Status: DC

## 2018-07-25 MED ORDER — TRIAMCINOLONE ACETONIDE 40 MG/ML IJ SUSP
40.0000 mg | Freq: Once | INTRAMUSCULAR | Status: AC
  Administered 2018-07-25: 40 mg via INTRAMUSCULAR

## 2018-07-25 MED ORDER — AMITRIPTYLINE HCL 75 MG PO TABS: 75.0000 mg | ORAL_TABLET | Freq: Every day | ORAL | 1 refills | Status: DC

## 2018-07-25 NOTE — Progress Notes (Signed)
BP 104/66   Pulse 76   Temp 98.4 F (36.9 C) (Oral)   Ht 5\' 10"  (1.778 m)   Wt 184 lb 6.4 oz (83.6 kg)   SpO2 95%   BMI 26.46 kg/m    Subjective:    Patient ID: Lance Walsh, male    DOB: 1976-03-25, 42 y.o.   MRN: [id number]  HPI: Lance Walsh is a 42 y.o. male  Chief Complaint  Patient presents with  . Headache    x 1 month/ have taken BC powder  . Abdominal Pain    pt statres upper abdomen and moves around the stomach   Migraines worsening x 1 month, every day headaches with N/V. Imitrex not helping much, now taking lots of excedrin migraine and BC powders. Having dizziness and blurry vision with the more severe headaches. Neck soreness, decreased mobility in neck. Had a sinus infection but got over that and headaches did not improve. Just had eye exam which was normal.   Weakness, numbness in entire body sometimes in the mornings x 8 months, one morning could barely move right leg for about 30 min. No lasting issues, hx of neurologic issues, tremors.   Having about a month of epigastric pain and nausea. Wondering if it's from all the OTC pain relievers he's taking for headaches. Does also take daily meloxicam as part of his regular regimen. Denies vomiting, diarrhea, melena, fevers.   Past Medical History:  Diagnosis Date  . Bipolar 1 disorder (HCC)   . Calculus of kidney   . Depression   . Fatty liver   . Plantar fasciitis    Social History   Socioeconomic History  . Marital status: Married    Spouse name: Not on file  . Number of children: Not on file  . Years of education: Not on file  . Highest education level: Not on file  Occupational History  . Not on file  Social Needs  . Financial resource strain: Not on file  . Food insecurity:    Worry: Not on file    Inability: Not on file  . Transportation needs:    Medical: Not on file    Non-medical: Not on file  Tobacco Use  . Smoking status: Never Smoker  . Smokeless tobacco: Never Used  Substance  and Sexual Activity  . Alcohol use: Yes  . Drug use: No  . Sexual activity: Not on file  Lifestyle  . Physical activity:    Days per week: Not on file    Minutes per session: Not on file  . Stress: Not on file  Relationships  . Social connections:    Talks on phone: Not on file    Gets together: Not on file    Attends religious service: Not on file    Active member of club or organization: Not on file    Attends meetings of clubs or organizations: Not on file    Relationship status: Not on file  . Intimate partner violence:    Fear of current or ex partner: Not on file    Emotionally abused: Not on file    Physically abused: Not on file    Forced sexual activity: Not on file  Other Topics Concern  . Not on file  Social History Narrative  . Not on file    Relevant past medical, surgical, family and social history reviewed and updated as indicated. Interim medical history since our last visit reviewed. Allergies and medications reviewed and  updated.  Review of Systems  Per HPI unless specifically indicated above     Objective:    BP 104/66   Pulse 76   Temp 98.4 F (36.9 C) (Oral)   Ht 5\' 10"  (1.778 m)   Wt 184 lb 6.4 oz (83.6 kg)   SpO2 95%   BMI 26.46 kg/m   Wt Readings from Last 3 Encounters:  07/25/18 184 lb 6.4 oz (83.6 kg)  12/29/16 202 lb (91.6 kg)  10/05/16 199 lb (90.3 kg)    Physical Exam  Constitutional: He is oriented to person, place, and time. He appears well-developed and well-nourished. No distress.  HENT:  Head: Atraumatic.  Nose: Nose normal.  Mouth/Throat: Oropharynx is clear and moist.  Eyes: Conjunctivae and EOM are normal.  Neck: Normal range of motion. Neck supple.  Cardiovascular: Normal rate, regular rhythm and intact distal pulses.  Pulmonary/Chest: Effort normal and breath sounds normal.  Musculoskeletal: Normal range of motion.  Neurological: He is alert and oriented to person, place, and time. No cranial nerve deficit or  sensory deficit. Coordination normal.  Skin: Skin is warm and dry.  Psychiatric: He has a normal mood and affect. His behavior is normal.  Nursing note and vitals reviewed.   Results for orders placed or performed in visit on 12/29/16  Comprehensive metabolic panel  Result Value Ref Range   Glucose 85 65 - 99 mg/dL   BUN 19 6 - 24 mg/dL   Creatinine, Ser 4.330.98 0.76 - 1.27 mg/dL   GFR calc non Af Amer 96 >59 mL/min/1.73   GFR calc Af Amer 111 >59 mL/min/1.73   BUN/Creatinine Ratio 19 9 - 20   Sodium 140 134 - 144 mmol/L   Potassium 4.0 3.5 - 5.2 mmol/L   Chloride 101 96 - 106 mmol/L   CO2 24 18 - 29 mmol/L   Calcium 8.9 8.7 - 10.2 mg/dL   Total Protein 6.8 6.0 - 8.5 g/dL   Albumin 4.6 3.5 - 5.5 g/dL   Globulin, Total 2.2 1.5 - 4.5 g/dL   Albumin/Globulin Ratio 2.1 1.2 - 2.2   Bilirubin Total 0.5 0.0 - 1.2 mg/dL   Alkaline Phosphatase 50 39 - 117 IU/L   AST 20 0 - 40 IU/L   ALT 27 0 - 44 IU/L  Lipid panel  Result Value Ref Range   Cholesterol, Total 224 (H) 100 - 199 mg/dL   Triglycerides 295103 0 - 149 mg/dL   HDL 60 >18>39 mg/dL   VLDL Cholesterol Cal 21 5 - 40 mg/dL   LDL Calculated 841143 (H) 0 - 99 mg/dL   Chol/HDL Ratio 3.7 0.0 - 5.0 ratio units  Urinalysis, Routine w reflex microscopic  Result Value Ref Range   Specific Gravity, UA 1.020 1.005 - 1.030   pH, UA 7.5 5.0 - 7.5   Color, UA Yellow Yellow   Appearance Ur Clear Clear   Leukocytes, UA Negative Negative   Protein, UA Trace (A) Negative/Trace   Glucose, UA Negative Negative   Ketones, UA Trace (A) Negative   RBC, UA Negative Negative   Bilirubin, UA Negative Negative   Urobilinogen, Ur 0.2 0.2 - 1.0 mg/dL   Nitrite, UA Negative Negative  TSH  Result Value Ref Range   TSH 1.330 0.450 - 4.500 uIU/mL  CBC with Differential/Platelet  Result Value Ref Range   WBC 8.9 3.4 - 10.8 x10E3/uL   RBC 5.09 4.14 - 5.80 x10E6/uL   Hemoglobin 15.3 13.0 - 17.7 g/dL   Hematocrit 43.8  37.5 - 51.0 %   MCV 86 79 - 97 fL   MCH  30.1 26.6 - 33.0 pg   MCHC 34.9 31.5 - 35.7 g/dL   RDW 16.1 09.6 - 04.5 %   Platelets 280 150 - 379 x10E3/uL   Neutrophils 60 Not Estab. %   Lymphs 30 Not Estab. %   Monocytes 8 Not Estab. %   Eos 2 Not Estab. %   Basos 0 Not Estab. %   Neutrophils Absolute 5.3 1.4 - 7.0 x10E3/uL   Lymphocytes Absolute 2.7 0.7 - 3.1 x10E3/uL   Monocytes Absolute 0.7 0.1 - 0.9 x10E3/uL   EOS (ABSOLUTE) 0.2 0.0 - 0.4 x10E3/uL   Basophils Absolute 0.0 0.0 - 0.2 x10E3/uL   Immature Granulocytes 0 Not Estab. %   Immature Grans (Abs) 0.0 0.0 - 0.1 x10E3/uL      Assessment & Plan:   Problem List Items Addressed This Visit      Cardiovascular and Mediastinum   Migraine headache - Primary    Worsening in frequency and severity. Will start elavil in addition to imitrex prn. Avoid any known triggers. Continue rare use of OTC pain relievers. Long discussion today about not mixing meloxicam with OTC medications such as BC powders or ibuprofen. Can take tylenol if taking meloxicam that day.       Relevant Medications   EPINEPHrine (EPIPEN IJ)   cyclobenzaprine (FLEXERIL) 10 MG tablet   SUMAtriptan (IMITREX) 100 MG tablet   amitriptyline (ELAVIL) 75 MG tablet    Other Visit Diagnoses    Flu vaccine need       Relevant Orders   Flu Vaccine QUAD 36+ mos IM (Completed)   Need for diphtheria-tetanus-pertussis (Tdap) vaccine       Relevant Orders   Tdap vaccine greater than or equal to 7yo IM (Completed)   Strain of neck muscle, initial encounter       Suspect headaches are a combination of tension and migraine. Kenalog IM today, flexeril prn, stretches, heating pad   Relevant Medications   triamcinolone acetonide (KENALOG-40) injection 40 mg (Completed)   Acute gastritis without hemorrhage, unspecified gastritis type       Suspect gastritis from significant NSAID use. Avoid taking multiple daily, start carafate prn and prilosec daily. BRAT diet, push fluids       Follow up plan: Return in about 4 weeks  (around 08/22/2018) for headache f/u.

## 2018-07-25 NOTE — Patient Instructions (Addendum)
For stomach: Take 40 mg of prilosec (2 capsules) every morning on empty stomach (30 min before meal). Take carafate up to 4 times daily as needed For headaches: Take amitriptyline every night at bedtime. Take imitrex as needed like you've been doing. Can keep taking meloxicam once daily OR don't take that and take as needed over the counter medication. If taking meloxicam, can add tylenol as needed up to 4000 mg daily. Heat, stretches, massage, flexeril to help with neck soreness    Tdap Vaccine (Tetanus, Diphtheria and Pertussis): What You Need to Know 1. Why get vaccinated? Tetanus, diphtheria and pertussis are very serious diseases. Tdap vaccine can protect [place] from these diseases. And, Tdap vaccine given to pregnant women can protect newborn babies against pertussis. TETANUS (Lockjaw) is rare in the [place] today. It causes painful muscle tightening and stiffness, usually all over the body.  It can lead to tightening of muscles in the head and neck so you can't open your mouth, swallow, or sometimes even breathe. Tetanus kills about 1 out of 10 people who are infected even after receiving the best medical care.  DIPHTHERIA is also rare in the [place] today. It can cause a thick coating to form in the back of the throat.  It can lead to breathing problems, heart failure, paralysis, and death.  PERTUSSIS (Whooping Cough) causes severe coughing spells, which can cause difficulty breathing, vomiting and disturbed sleep.  It can also lead to weight loss, incontinence, and rib fractures. Up to 2 in 100 adolescents and 5 in 100 adults with pertussis are hospitalized or have complications, which could include pneumonia or death.  These diseases are caused by bacteria. Diphtheria and pertussis are spread from person to person through secretions from coughing or sneezing. Tetanus enters the body through cuts, scratches, or wounds. Before vaccines, as many as 200,000 cases of diphtheria,  200,000 cases of pertussis, and hundreds of cases of tetanus, were reported in the [place] each year. Since vaccination began, reports of cases for tetanus and diphtheria have dropped by about 99% and for pertussis by about 80%. 2. Tdap vaccine Tdap vaccine can protect adolescents and adults from tetanus, diphtheria, and pertussis. One dose of Tdap is routinely given at age 6 or 3. People who did not get Tdap at that age should get it as soon as possible. Tdap is especially important for healthcare professionals and anyone having close contact with a baby younger than 12 months. Pregnant women should get a dose of Tdap during every pregnancy, to protect the newborn from pertussis. Infants are most at risk for severe, life-threatening complications from pertussis. Another vaccine, called Td, protects against tetanus and diphtheria, but not pertussis. A Td booster should be given every 10 years. Tdap may be given as one of these boosters if you have never gotten Tdap before. Tdap may also be given after a severe cut or burn to prevent tetanus infection. Your doctor or the person giving you the vaccine can give you more information. Tdap may safely be given at the same time as other vaccines. 3. Some people should not get this vaccine  A person who has ever had a life-threatening allergic reaction after a previous dose of any diphtheria, tetanus or pertussis containing vaccine, OR has a severe allergy to any part of this vaccine, should not get Tdap vaccine. Tell the person giving the vaccine about any severe allergies.  Anyone who had coma or long repeated seizures within 7 days after  a childhood dose of DTP or DTaP, or a previous dose of Tdap, should not get Tdap, unless a cause other than the vaccine was found. They can still get Td.  Talk to your doctor if you: ? have seizures or another nervous system problem, ? had severe pain or swelling after any vaccine containing diphtheria, tetanus or  pertussis, ? ever had a condition called Guillain-Barr Syndrome (GBS), ? aren't feeling well on the day the shot is scheduled. 4. Risks With any medicine, including vaccines, there is a chance of side effects. These are usually mild and go away on their own. Serious reactions are also possible but are rare. Most people who get Tdap vaccine do not have any problems with it. Mild problems following Tdap: (Did not interfere with activities)  Pain where the shot was given (about 3 in 4 adolescents or 2 in 3 adults)  Redness or swelling where the shot was given (about 1 person in 5)  Mild fever of at least 100.53F (up to about 1 in 25 adolescents or 1 in 100 adults)  Headache (about 3 or 4 people in 10)  Tiredness (about 1 person in 3 or 4)  Nausea, vomiting, diarrhea, stomach ache (up to 1 in 4 adolescents or 1 in 10 adults)  Chills, sore joints (about 1 person in 10)  Body aches (about 1 person in 3 or 4)  Rash, swollen glands (uncommon)  Moderate problems following Tdap: (Interfered with activities, but did not require medical attention)  Pain where the shot was given (up to 1 in 5 or 6)  Redness or swelling where the shot was given (up to about 1 in 16 adolescents or 1 in 12 adults)  Fever over 102F (about 1 in 100 adolescents or 1 in 250 adults)  Headache (about 1 in 7 adolescents or 1 in 10 adults)  Nausea, vomiting, diarrhea, stomach ache (up to 1 or 3 people in 100)  Swelling of the entire arm where the shot was given (up to about 1 in 500).  Severe problems following Tdap: (Unable to perform usual activities; required medical attention)  Swelling, severe pain, bleeding and redness in the arm where the shot was given (rare).  Problems that could happen after any vaccine:  People sometimes faint after a medical procedure, including vaccination. Sitting or lying down for about 15 minutes can help prevent fainting, and injuries caused by a fall. Tell your doctor if  you feel dizzy, or have vision changes or ringing in the ears.  Some people get severe pain in the shoulder and have difficulty moving the arm where a shot was given. This happens very rarely.  Any medication can cause a severe allergic reaction. Such reactions from a vaccine are very rare, estimated at fewer than 1 in a million doses, and would happen within a few minutes to a few hours after the vaccination. As with any medicine, there is a very remote chance of a vaccine causing a serious injury or death. The safety of vaccines is always being monitored. For more information, visit: http://floyd.org/ 5. What if there is a serious problem? What should I look for? Look for anything that concerns you, such as signs of a severe allergic reaction, very high fever, or unusual behavior. Signs of a severe allergic reaction can include hives, swelling of the face and throat, difficulty breathing, a fast heartbeat, dizziness, and weakness. These would usually start a few minutes to a few hours after the vaccination. What should  I do?  If you think it is a severe allergic reaction or other emergency that can't wait, call 9-1-1 or get the person to the nearest hospital. Otherwise, call your doctor.  Afterward, the reaction should be reported to the Vaccine Adverse Event Reporting System (VAERS). Your doctor might file this report, or you can do it yourself through the VAERS web site at www.vaers.LAgents.nohhs.gov, or by calling 1-(236)090-3427. ? VAERS does not give medical advice. 6. The National Vaccine Injury Compensation Program The [employer] Vaccine Injury Compensation Program (VICP) is a federal program that was created to compensate people who may have been injured by certain vaccines. Persons who believe they may have been injured by a vaccine can learn about the program and about filing a claim by calling 1-587-863-6108 or visiting the VICP website at SpiritualWord.atwww.hrsa.gov/vaccinecompensation. There is a time  limit to file a claim for compensation. 7. How can I learn more?  Ask your doctor. He or she can give you the vaccine package insert or suggest other sources of information.  Call your local or state health department.  Contact the Centers for Disease Control and Prevention (CDC): ? Call 80618644641-819-630-5726 (1-800-CDC-INFO) or ? Visit CDC's website at PicCapture.uywww.cdc.gov/vaccines CDC Tdap Vaccine VIS (01/22/14) This information is not intended to replace advice given to you by your health care provider. Make sure you discuss any questions you have with your health care provider. Document Released: 05/16/2012 Document Revised: 08/05/2016 Document Reviewed: 08/05/2016 Elsevier Interactive Patient Education  2017 [employer] Influenza (Flu) Vaccine (Inactivated or Recombinant): What You Need to Know 1. Why get vaccinated? Influenza ("flu") is a contagious disease that spreads around the [place] every year, usually between October and May. Flu is caused by influenza viruses, and is spread mainly by coughing, sneezing, and close contact. Anyone can get flu. Flu strikes suddenly and can last several days. Symptoms vary by age, but can include:  fever/chills  sore throat  muscle aches  fatigue  cough  headache  runny or stuffy nose  Flu can also lead to pneumonia and blood infections, and cause diarrhea and seizures in children. If you have a medical condition, such as heart or lung disease, flu can make it worse. Flu is more dangerous for some people. Infants and young children, people 165 years of age and older, pregnant women, and people with certain health conditions or a weakened immune system are at greatest risk. Each year thousands of people in the [place] die from flu, and many more are hospitalized. Flu vaccine can:  keep you from getting flu,  make flu less severe if you do get it, and  keep you from spreading flu to your family and other people. 2. Inactivated and  recombinant flu vaccines A dose of flu vaccine is recommended every flu season. Children 6 months through 698 years of age may need two doses during the same flu season. Everyone else needs only one dose each flu season. Some inactivated flu vaccines contain a very small amount of a mercury-based preservative called thimerosal. Studies have not shown thimerosal in vaccines to be harmful, but flu vaccines that do not contain thimerosal are available. There is no live flu virus in flu shots. They cannot cause the flu. There are many flu viruses, and they are always changing. Each year a new flu vaccine is made to protect against three or four viruses that are likely to cause disease in the upcoming flu season. But even when the vaccine doesn't exactly match  these viruses, it may still provide some protection. Flu vaccine cannot prevent:  flu that is caused by a virus not covered by the vaccine, or  illnesses that look like flu but are not.  It takes about 2 weeks for protection to develop after vaccination, and protection lasts through the flu season. 3. Some people should not get this vaccine Tell the person who is giving you the vaccine:  If you have any severe, life-threatening allergies. If you ever had a life-threatening allergic reaction after a dose of flu vaccine, or have a severe allergy to any part of this vaccine, you may be advised not to get vaccinated. Most, but not all, types of flu vaccine contain a small amount of egg protein.  If you ever had Guillain-Barr Syndrome (also called GBS). Some people with a history of GBS should not get this vaccine. This should be discussed with your doctor.  If you are not feeling well. It is usually okay to get flu vaccine when you have a mild illness, but you might be asked to come back when you feel better.  4. Risks of a vaccine reaction With any medicine, including vaccines, there is a chance of reactions. These are usually mild and go away on  their own, but serious reactions are also possible. Most people who get a flu shot do not have any problems with it. Minor problems following a flu shot include:  soreness, redness, or swelling where the shot was given  hoarseness  sore, red or itchy eyes  cough  fever  aches  headache  itching  fatigue  If these problems occur, they usually begin soon after the shot and last 1 or 2 days. More serious problems following a flu shot can include the following:  There may be a small increased risk of Guillain-Barre Syndrome (GBS) after inactivated flu vaccine. This risk has been estimated at 1 or 2 additional cases per million people vaccinated. This is much lower than the risk of severe complications from flu, which can be prevented by flu vaccine.  Young children who get the flu shot along with pneumococcal vaccine (PCV13) and/or DTaP vaccine at the same time might be slightly more likely to have a seizure caused by fever. Ask your doctor for more information. Tell your doctor if a child who is getting flu vaccine has ever had a seizure.  Problems that could happen after any injected vaccine:  People sometimes faint after a medical procedure, including vaccination. Sitting or lying down for about 15 minutes can help prevent fainting, and injuries caused by a fall. Tell your doctor if you feel dizzy, or have vision changes or ringing in the ears.  Some people get severe pain in the shoulder and have difficulty moving the arm where a shot was given. This happens very rarely.  Any medication can cause a severe allergic reaction. Such reactions from a vaccine are very rare, estimated at about 1 in a million doses, and would happen within a few minutes to a few hours after the vaccination. As with any medicine, there is a very remote chance of a vaccine causing a serious injury or death. The safety of vaccines is always being monitored. For more information, visit:  http://floyd.org/ 5. What if there is a serious reaction? What should I look for? Look for anything that concerns you, such as signs of a severe allergic reaction, very high fever, or unusual behavior. Signs of a severe allergic reaction can include hives, swelling  of the face and throat, difficulty breathing, a fast heartbeat, dizziness, and weakness. These would start a few minutes to a few hours after the vaccination. What should I do?  If you think it is a severe allergic reaction or other emergency that can't wait, call 9-1-1 and get the person to the nearest hospital. Otherwise, call your doctor.  Reactions should be reported to the Vaccine Adverse Event Reporting System (VAERS). Your doctor should file this report, or you can do it yourself through the VAERS web site at www.vaers.LAgents.no, or by calling 1-506-663-1287. ? VAERS does not give medical advice. 6. The National Vaccine Injury Compensation Program The [employer] Vaccine Injury Compensation Program (VICP) is a federal program that was created to compensate people who may have been injured by certain vaccines. Persons who believe they may have been injured by a vaccine can learn about the program and about filing a claim by calling 1-480 556 6759 or visiting the VICP website at SpiritualWord.at. There is a time limit to file a claim for compensation. 7. How can I learn more?  Ask your healthcare provider. He or she can give you the vaccine package insert or suggest other sources of information.  Call your local or state health department.  Contact the Centers for Disease Control and Prevention (CDC): ? Call 914 502 9137 (1-800-CDC-INFO) or ? Visit CDC's website at BiotechRoom.com.cy Vaccine Information Statement, Inactivated Influenza Vaccine (07/05/2014) This information is not intended to replace advice given to you by your health care provider. Make sure you discuss any questions you have with your  health care provider. Document Released: 09/09/2006 Document Revised: 08/05/2016 Document Reviewed: 08/05/2016 Elsevier Interactive Patient Education  2017 [employer].

## 2018-07-27 NOTE — Assessment & Plan Note (Signed)
Worsening in frequency and severity. Will start elavil in addition to imitrex prn. Avoid any known triggers. Continue rare use of OTC pain relievers. Long discussion today about not mixing meloxicam with OTC medications such as BC powders or ibuprofen. Can take tylenol if taking meloxicam that day.

## 2018-08-09 ENCOUNTER — Other Ambulatory Visit: Payer: Self-pay

## 2018-08-09 ENCOUNTER — Encounter: Payer: Self-pay | Admitting: Family Medicine

## 2018-08-09 ENCOUNTER — Ambulatory Visit: Payer: Managed Care, Other (non HMO) | Admitting: Family Medicine

## 2018-08-09 VITALS — BP 102/67 | HR 66 | Temp 98.3°F | Ht 70.0 in | Wt 183.0 lb

## 2018-08-09 DIAGNOSIS — R1013 Epigastric pain: Secondary | ICD-10-CM | POA: Diagnosis not present

## 2018-08-09 DIAGNOSIS — G43909 Migraine, unspecified, not intractable, without status migrainosus: Secondary | ICD-10-CM

## 2018-08-09 NOTE — Progress Notes (Signed)
BP 102/67   Pulse 66   Temp 98.3 F (36.8 C) (Oral)   Ht 5\' 10"  (1.778 m)   Wt 183 lb (83 kg)   SpO2 96%   BMI 26.26 kg/m    Subjective:    Patient ID: Lance Walsh, male    DOB: 10/04/1976, 42 y.o.   MRN: [id number]  HPI: Lance Walsh is a 42 y.o. male  Chief Complaint  Patient presents with  . Headache    f/u   Here today for headache and abdominal pain f/u.   Taking the carafate pretty regularly as well as the omeprazole with good relief of his stomach pain. Has completely stopped the NSAIDs for now. Denies hematemesis, melena, reflux, burning abdominal pain.   Neck is still tense but flexeril has helped. Headaches much improved since starting the elavil. It does make him quite sleepy the next day. Has not tried cutting tablets in half. Denies visual changes, mental status changes.   Past Medical History:  Diagnosis Date  . Bipolar 1 disorder (HCC)   . Calculus of kidney   . Depression   . Fatty liver   . Plantar fasciitis    Social History   Socioeconomic History  . Marital status: Married    Spouse name: Not on file  . Number of children: Not on file  . Years of education: Not on file  . Highest education level: Not on file  Occupational History  . Not on file  Social Needs  . Financial resource strain: Not on file  . Food insecurity:    Worry: Not on file    Inability: Not on file  . Transportation needs:    Medical: Not on file    Non-medical: Not on file  Tobacco Use  . Smoking status: Never Smoker  . Smokeless tobacco: Never Used  Substance and Sexual Activity  . Alcohol use: Yes  . Drug use: No  . Sexual activity: Not on file  Lifestyle  . Physical activity:    Days per week: Not on file    Minutes per session: Not on file  . Stress: Not on file  Relationships  . Social connections:    Talks on phone: Not on file    Gets together: Not on file    Attends religious service: Not on file    Active member of club or organization: Not on  file    Attends meetings of clubs or organizations: Not on file    Relationship status: Not on file  . Intimate partner violence:    Fear of current or ex partner: Not on file    Emotionally abused: Not on file    Physically abused: Not on file    Forced sexual activity: Not on file  Other Topics Concern  . Not on file  Social History Narrative  . Not on file     Relevant past medical, surgical, family and social history reviewed and updated as indicated. Interim medical history since our last visit reviewed. Allergies and medications reviewed and updated.  Review of Systems  Per HPI unless specifically indicated above     Objective:    BP 102/67   Pulse 66   Temp 98.3 F (36.8 C) (Oral)   Ht 5\' 10"  (1.778 m)   Wt 183 lb (83 kg)   SpO2 96%   BMI 26.26 kg/m   Wt Readings from Last 3 Encounters:  08/09/18 183 lb (83 kg)  07/25/18 184 lb  6.4 oz (83.6 kg)  12/29/16 202 lb (91.6 kg)    Physical Exam  Constitutional: He is oriented to person, place, and time. He appears well-developed and well-nourished.  Appears drowsy (states he just came off a long work shift)  HENT:  Head: Atraumatic.  Eyes: Conjunctivae and EOM are normal.  Neck: Normal range of motion. Neck supple.  Left trapezius ttp  Cardiovascular: Normal rate and regular rhythm.  Pulmonary/Chest: Effort normal and breath sounds normal.  Abdominal: Soft. Bowel sounds are normal. There is no tenderness.  Musculoskeletal: Normal range of motion.  Neurological: He is alert and oriented to person, place, and time.  Skin: Skin is warm.  Psychiatric: Judgment and thought content normal.  Nursing note and vitals reviewed.   Results for orders placed or performed in visit on 12/29/16  Comprehensive metabolic panel  Result Value Ref Range   Glucose 85 65 - 99 mg/dL   BUN 19 6 - 24 mg/dL   Creatinine, Ser 0.86 0.76 - 1.27 mg/dL   GFR calc non Af Amer 96 >59 mL/min/1.73   GFR calc Af Amer 111 >59 mL/min/1.73    BUN/Creatinine Ratio 19 9 - 20   Sodium 140 134 - 144 mmol/L   Potassium 4.0 3.5 - 5.2 mmol/L   Chloride 101 96 - 106 mmol/L   CO2 24 18 - 29 mmol/L   Calcium 8.9 8.7 - 10.2 mg/dL   Total Protein 6.8 6.0 - 8.5 g/dL   Albumin 4.6 3.5 - 5.5 g/dL   Globulin, Total 2.2 1.5 - 4.5 g/dL   Albumin/Globulin Ratio 2.1 1.2 - 2.2   Bilirubin Total 0.5 0.0 - 1.2 mg/dL   Alkaline Phosphatase 50 39 - 117 IU/L   AST 20 0 - 40 IU/L   ALT 27 0 - 44 IU/L  Lipid panel  Result Value Ref Range   Cholesterol, Total 224 (H) 100 - 199 mg/dL   Triglycerides 578 0 - 149 mg/dL   HDL 60 >46 mg/dL   VLDL Cholesterol Cal 21 5 - 40 mg/dL   LDL Calculated 962 (H) 0 - 99 mg/dL   Chol/HDL Ratio 3.7 0.0 - 5.0 ratio units  Urinalysis, Routine w reflex microscopic  Result Value Ref Range   Specific Gravity, UA 1.020 1.005 - 1.030   pH, UA 7.5 5.0 - 7.5   Color, UA Yellow Yellow   Appearance Ur Clear Clear   Leukocytes, UA Negative Negative   Protein, UA Trace (A) Negative/Trace   Glucose, UA Negative Negative   Ketones, UA Trace (A) Negative   RBC, UA Negative Negative   Bilirubin, UA Negative Negative   Urobilinogen, Ur 0.2 0.2 - 1.0 mg/dL   Nitrite, UA Negative Negative  TSH  Result Value Ref Range   TSH 1.330 0.450 - 4.500 uIU/mL  CBC with Differential/Platelet  Result Value Ref Range   WBC 8.9 3.4 - 10.8 x10E3/uL   RBC 5.09 4.14 - 5.80 x10E6/uL   Hemoglobin 15.3 13.0 - 17.7 g/dL   Hematocrit 95.2 84.1 - 51.0 %   MCV 86 79 - 97 fL   MCH 30.1 26.6 - 33.0 pg   MCHC 34.9 31.5 - 35.7 g/dL   RDW 32.4 40.1 - 02.7 %   Platelets 280 150 - 379 x10E3/uL   Neutrophils 60 Not Estab. %   Lymphs 30 Not Estab. %   Monocytes 8 Not Estab. %   Eos 2 Not Estab. %   Basos 0 Not Estab. %   Neutrophils Absolute  5.3 1.4 - 7.0 x10E3/uL   Lymphocytes Absolute 2.7 0.7 - 3.1 x10E3/uL   Monocytes Absolute 0.7 0.1 - 0.9 x10E3/uL   EOS (ABSOLUTE) 0.2 0.0 - 0.4 x10E3/uL   Basophils Absolute 0.0 0.0 - 0.2 x10E3/uL    Immature Granulocytes 0 Not Estab. %   Immature Grans (Abs) 0.0 0.0 - 0.1 x10E3/uL      Assessment & Plan:   Problem List Items Addressed This Visit      Cardiovascular and Mediastinum   Migraine headache - Primary    Will try cutting elavil in half and see if still having the improvement in headaches. Continue flexeril, stretches, heating pads. Imitrex there if needed       Other Visit Diagnoses    Epigastric pain       Much improved with d/c of NSAIDs and use of prilosec and cafarate. Continue this regimen for now       Follow up plan: Return in about 4 months (around 12/09/2018) for CPE with Dr. Dossie Arbour.

## 2018-08-12 NOTE — Patient Instructions (Signed)
Follow up for CPE 

## 2018-08-12 NOTE — Assessment & Plan Note (Signed)
Will try cutting elavil in half and see if still having the improvement in headaches. Continue flexeril, stretches, heating pads. Imitrex there if needed

## 2018-11-13 ENCOUNTER — Ambulatory Visit (INDEPENDENT_AMBULATORY_CARE_PROVIDER_SITE_OTHER): Payer: Managed Care, Other (non HMO) | Admitting: Family Medicine

## 2018-11-13 ENCOUNTER — Encounter: Payer: Self-pay | Admitting: Family Medicine

## 2018-11-13 VITALS — BP 107/76 | HR 67 | Temp 97.9°F | Ht 70.0 in | Wt 192.0 lb

## 2018-11-13 DIAGNOSIS — Z0000 Encounter for general adult medical examination without abnormal findings: Secondary | ICD-10-CM | POA: Diagnosis not present

## 2018-11-13 DIAGNOSIS — F3342 Major depressive disorder, recurrent, in full remission: Secondary | ICD-10-CM | POA: Diagnosis not present

## 2018-11-13 DIAGNOSIS — G4700 Insomnia, unspecified: Secondary | ICD-10-CM

## 2018-11-13 DIAGNOSIS — G43909 Migraine, unspecified, not intractable, without status migrainosus: Secondary | ICD-10-CM

## 2018-11-13 DIAGNOSIS — M79673 Pain in unspecified foot: Secondary | ICD-10-CM | POA: Insufficient documentation

## 2018-11-13 DIAGNOSIS — M722 Plantar fascial fibromatosis: Secondary | ICD-10-CM | POA: Insufficient documentation

## 2018-11-13 LAB — URINALYSIS, ROUTINE W REFLEX MICROSCOPIC
Bilirubin, UA: NEGATIVE
Glucose, UA: NEGATIVE
Ketones, UA: NEGATIVE
Leukocytes, UA: NEGATIVE
Nitrite, UA: NEGATIVE
Protein, UA: NEGATIVE
RBC, UA: NEGATIVE
Specific Gravity, UA: 1.015 (ref 1.005–1.030)
Urobilinogen, Ur: 0.2 mg/dL (ref 0.2–1.0)
pH, UA: 6 (ref 5.0–7.5)

## 2018-11-13 MED ORDER — FLUTICASONE PROPIONATE 50 MCG/ACT NA SUSP: 2.0000 | Freq: Every day | NASAL | 12 refills | Status: DC

## 2018-11-13 MED ORDER — SUMATRIPTAN SUCCINATE 100 MG PO TABS: 100.0000 mg | ORAL_TABLET | ORAL | 12 refills | Status: DC | PRN

## 2018-11-13 MED ORDER — AMITRIPTYLINE HCL 75 MG PO TABS: 75.0000 mg | ORAL_TABLET | Freq: Every day | ORAL | 4 refills | Status: DC

## 2018-11-13 MED ORDER — OMEPRAZOLE 20 MG PO CPDR: 20.0000 mg | DELAYED_RELEASE_CAPSULE | Freq: Two times a day (BID) | ORAL | 4 refills | Status: DC

## 2018-11-13 MED ORDER — MELOXICAM 15 MG PO TABS: 15.0000 mg | ORAL_TABLET | Freq: Every day | ORAL | 4 refills | Status: DC

## 2018-11-13 NOTE — Assessment & Plan Note (Signed)
Doing well with amitriptyline 75

## 2018-11-13 NOTE — Progress Notes (Signed)
BP 107/76   Pulse 67   Temp 97.9 F (36.6 C) (Oral)   Ht 5\' 10"  (1.778 m)   Wt 192 lb (87.1 kg)   SpO2 98%   BMI 27.55 kg/m    Subjective:    Patient ID: Lance Walsh, male    DOB: 07-07-1976, 42 y.o.   MRN: [id number]  HPI: Lance Walsh is a 42 y.o. male  Chief Complaint  Patient presents with  . Annual Exam  Patient taking amitriptyline 75 mg at bedtime for sleep and also helps with headaches has done well with this and wants refills.  Also reviewed migraine headaches which are stable reviewed medications and doing stable taking meloxicam on a regular basis has not had lorazepam refilled for a couple of years wants to have another refill just to have on standby.  Relevant past medical, surgical, family and social history reviewed and updated as indicated. Interim medical history since our last visit reviewed. Allergies and medications reviewed and updated.  Review of Systems  Constitutional: Negative.   HENT: Negative.   Eyes: Negative.   Respiratory: Negative.   Cardiovascular: Negative.   Gastrointestinal: Negative.   Endocrine: Negative.   Genitourinary: Negative.   Musculoskeletal: Negative.   Skin: Negative.   Allergic/Immunologic: Negative.   Neurological: Negative.   Hematological: Negative.   Psychiatric/Behavioral: Negative.     Per HPI unless specifically indicated above     Objective:    BP 107/76   Pulse 67   Temp 97.9 F (36.6 C) (Oral)   Ht 5\' 10"  (1.778 m)   Wt 192 lb (87.1 kg)   SpO2 98%   BMI 27.55 kg/m   Wt Readings from Last 3 Encounters:  11/13/18 192 lb (87.1 kg)  08/09/18 183 lb (83 kg)  07/25/18 184 lb 6.4 oz (83.6 kg)    Physical Exam Constitutional:      Appearance: He is well-developed.  HENT:     Head: Normocephalic and atraumatic.     Right Ear: External ear normal.     Left Ear: External ear normal.  Eyes:     Conjunctiva/sclera: Conjunctivae normal.     Pupils: Pupils are equal, round, and reactive to light.    Neck:     Musculoskeletal: Normal range of motion and neck supple.  Cardiovascular:     Rate and Rhythm: Normal rate and regular rhythm.     Heart sounds: Normal heart sounds.  Pulmonary:     Effort: Pulmonary effort is normal.     Breath sounds: Normal breath sounds.  Abdominal:     General: Bowel sounds are normal.     Palpations: Abdomen is soft. There is no hepatomegaly or splenomegaly.  Genitourinary:    Penis: Normal.      Prostate: Normal.     Rectum: Normal.  Musculoskeletal: Normal range of motion.  Skin:    Findings: No erythema or rash.  Neurological:     Mental Status: He is alert and oriented to person, place, and time.     Deep Tendon Reflexes: Reflexes are normal and symmetric.  Psychiatric:        Behavior: Behavior normal.        Thought Content: Thought content normal.        Judgment: Judgment normal.     Results for orders placed or performed in visit on 12/29/16  Comprehensive metabolic panel  Result Value Ref Range   Glucose 85 65 - 99 mg/dL   BUN 19 6 -  24 mg/dL   Creatinine, Ser 4.16 0.76 - 1.27 mg/dL   GFR calc non Af Amer 96 >59 mL/min/1.73   GFR calc Af Amer 111 >59 mL/min/1.73   BUN/Creatinine Ratio 19 9 - 20   Sodium 140 134 - 144 mmol/L   Potassium 4.0 3.5 - 5.2 mmol/L   Chloride 101 96 - 106 mmol/L   CO2 24 18 - 29 mmol/L   Calcium 8.9 8.7 - 10.2 mg/dL   Total Protein 6.8 6.0 - 8.5 g/dL   Albumin 4.6 3.5 - 5.5 g/dL   Globulin, Total 2.2 1.5 - 4.5 g/dL   Albumin/Globulin Ratio 2.1 1.2 - 2.2   Bilirubin Total 0.5 0.0 - 1.2 mg/dL   Alkaline Phosphatase 50 39 - 117 IU/L   AST 20 0 - 40 IU/L   ALT 27 0 - 44 IU/L  Lipid panel  Result Value Ref Range   Cholesterol, Total 224 (H) 100 - 199 mg/dL   Triglycerides 606 0 - 149 mg/dL   HDL 60 >30 mg/dL   VLDL Cholesterol Cal 21 5 - 40 mg/dL   LDL Calculated 160 (H) 0 - 99 mg/dL   Chol/HDL Ratio 3.7 0.0 - 5.0 ratio units  Urinalysis, Routine w reflex microscopic  Result Value Ref Range    Specific Gravity, UA 1.020 1.005 - 1.030   pH, UA 7.5 5.0 - 7.5   Color, UA Yellow Yellow   Appearance Ur Clear Clear   Leukocytes, UA Negative Negative   Protein, UA Trace (A) Negative/Trace   Glucose, UA Negative Negative   Ketones, UA Trace (A) Negative   RBC, UA Negative Negative   Bilirubin, UA Negative Negative   Urobilinogen, Ur 0.2 0.2 - 1.0 mg/dL   Nitrite, UA Negative Negative  TSH  Result Value Ref Range   TSH 1.330 0.450 - 4.500 uIU/mL  CBC with Differential/Platelet  Result Value Ref Range   WBC 8.9 3.4 - 10.8 x10E3/uL   RBC 5.09 4.14 - 5.80 x10E6/uL   Hemoglobin 15.3 13.0 - 17.7 g/dL   Hematocrit 10.9 32.3 - 51.0 %   MCV 86 79 - 97 fL   MCH 30.1 26.6 - 33.0 pg   MCHC 34.9 31.5 - 35.7 g/dL   RDW 55.7 32.2 - 02.5 %   Platelets 280 150 - 379 x10E3/uL   Neutrophils 60 Not Estab. %   Lymphs 30 Not Estab. %   Monocytes 8 Not Estab. %   Eos 2 Not Estab. %   Basos 0 Not Estab. %   Neutrophils Absolute 5.3 1.4 - 7.0 x10E3/uL   Lymphocytes Absolute 2.7 0.7 - 3.1 x10E3/uL   Monocytes Absolute 0.7 0.1 - 0.9 x10E3/uL   EOS (ABSOLUTE) 0.2 0.0 - 0.4 x10E3/uL   Basophils Absolute 0.0 0.0 - 0.2 x10E3/uL   Immature Granulocytes 0 Not Estab. %   Immature Grans (Abs) 0.0 0.0 - 0.1 x10E3/uL      Assessment & Plan:   Problem List Items Addressed This Visit      Cardiovascular and Mediastinum   Migraine headache    The current medical regimen is effective;  continue present plan and medications.       Relevant Medications   amitriptyline (ELAVIL) 75 MG tablet   SUMAtriptan (IMITREX) 100 MG tablet   meloxicam (MOBIC) 15 MG tablet     Other   Depression    The current medical regimen is effective;  continue present plan and medications.       Relevant Medications  amitriptyline (ELAVIL) 75 MG tablet   Insomnia    Doing well with amitriptyline 75       Other Visit Diagnoses    PE (physical exam), annual    -  Primary   Relevant Orders   Comprehensive  metabolic panel   Lipid panel   CBC with Differential/Platelet   TSH   Urinalysis, Routine w reflex microscopic       Follow up plan: Return in about 1 year (around 11/14/2019) for Physical Exam.

## 2018-11-13 NOTE — Assessment & Plan Note (Signed)
The current medical regimen is effective;  continue present plan and medications.  

## 2018-11-14 ENCOUNTER — Encounter: Payer: Self-pay | Admitting: Family Medicine

## 2018-11-14 LAB — COMPREHENSIVE METABOLIC PANEL
ALT: 31 IU/L (ref 0–44)
AST: 19 IU/L (ref 0–40)
Albumin/Globulin Ratio: 2.4 — ABNORMAL HIGH (ref 1.2–2.2)
Albumin: 4.5 g/dL (ref 3.5–5.5)
Alkaline Phosphatase: 54 IU/L (ref 39–117)
BUN/Creatinine Ratio: 21 — ABNORMAL HIGH (ref 9–20)
BUN: 18 mg/dL (ref 6–24)
Bilirubin Total: 0.6 mg/dL (ref 0.0–1.2)
CO2: 24 mmol/L (ref 20–29)
Calcium: 9 mg/dL (ref 8.7–10.2)
Chloride: 99 mmol/L (ref 96–106)
Creatinine, Ser: 0.87 mg/dL (ref 0.76–1.27)
GFR calc non Af Amer: 106 mL/min/{1.73_m2} (ref >59)
GFR, EST AFRICAN AMERICAN: 123 mL/min/{1.73_m2} (ref >59)
GLOBULIN, TOTAL: 1.9 g/dL (ref 1.5–4.5)
Glucose: 95 mg/dL (ref 65–99)
Potassium: 4.1 mmol/L (ref 3.5–5.2)
Sodium: 140 mmol/L (ref 134–144)
Total Protein: 6.4 g/dL (ref 6.0–8.5)

## 2018-11-14 LAB — CBC WITH DIFFERENTIAL/PLATELET
Basophils Absolute: 0 10*3/uL (ref 0.0–0.2)
Basos: 1 %
EOS (ABSOLUTE): 0.2 10*3/uL (ref 0.0–0.4)
Eos: 3 %
Hematocrit: 44.1 % (ref 37.5–51.0)
Hemoglobin: 15.4 g/dL (ref 13.0–17.7)
Immature Grans (Abs): 0 10*3/uL (ref 0.0–0.1)
Immature Granulocytes: 0 %
Lymphocytes Absolute: 2.5 10*3/uL (ref 0.7–3.1)
Lymphs: 35 %
MCH: 30 pg (ref 26.6–33.0)
MCHC: 34.9 g/dL (ref 31.5–35.7)
MCV: 86 fL (ref 79–97)
MONOCYTES: 7 %
Monocytes Absolute: 0.5 10*3/uL (ref 0.1–0.9)
Neutrophils Absolute: 3.9 10*3/uL (ref 1.4–7.0)
Neutrophils: 54 %
Platelets: 254 10*3/uL (ref 150–450)
RBC: 5.14 x10E6/uL (ref 4.14–5.80)
RDW: 12.5 % (ref 12.3–15.4)
WBC: 7.2 10*3/uL (ref 3.4–10.8)

## 2018-11-14 LAB — LIPID PANEL
Chol/HDL Ratio: 4.4 ratio (ref 0.0–5.0)
Cholesterol, Total: 240 mg/dL — ABNORMAL HIGH (ref 100–199)
HDL: 55 mg/dL (ref >39)
LDL Calculated: 169 mg/dL — ABNORMAL HIGH (ref 0–99)
Triglycerides: 81 mg/dL (ref 0–149)
VLDL Cholesterol Cal: 16 mg/dL (ref 5–40)

## 2018-11-14 LAB — TSH: TSH: 2.32 u[IU]/mL (ref 0.450–4.500)

## 2019-02-13 ENCOUNTER — Encounter: Payer: Self-pay | Admitting: Family Medicine

## 2019-02-13 ENCOUNTER — Ambulatory Visit: Payer: Managed Care, Other (non HMO) | Admitting: Family Medicine

## 2019-02-13 ENCOUNTER — Other Ambulatory Visit: Payer: Self-pay

## 2019-02-13 VITALS — BP 110/73 | HR 83 | Temp 98.1°F

## 2019-02-13 DIAGNOSIS — R05 Cough: Secondary | ICD-10-CM | POA: Diagnosis not present

## 2019-02-13 DIAGNOSIS — J309 Allergic rhinitis, unspecified: Secondary | ICD-10-CM | POA: Insufficient documentation

## 2019-02-13 DIAGNOSIS — J3089 Other allergic rhinitis: Secondary | ICD-10-CM

## 2019-02-13 DIAGNOSIS — R059 Cough, unspecified: Secondary | ICD-10-CM

## 2019-02-13 DIAGNOSIS — J0100 Acute maxillary sinusitis, unspecified: Secondary | ICD-10-CM | POA: Diagnosis not present

## 2019-02-13 MED ORDER — AZITHROMYCIN 250 MG PO TABS: ORAL_TABLET | ORAL | 0 refills | Status: DC

## 2019-02-13 MED ORDER — PREDNISONE 10 MG PO TABS: ORAL_TABLET | ORAL | 0 refills | Status: DC

## 2019-02-13 NOTE — Assessment & Plan Note (Signed)
Continue allegra and BID flonase, sinus rinses, humidifier

## 2019-02-13 NOTE — Progress Notes (Signed)
BP 110/73 (BP Location: Left Arm, Patient Position: Sitting, Cuff Size: Normal)   Pulse 83   Temp 98.1 F (36.7 C) (Oral)   SpO2 96%    Subjective:    Patient ID: Lance Walsh, male    DOB: 05/19/1976, 43 y.o.   MRN: [id number]  HPI: Lance Walsh is a 43 y.o. male  Chief Complaint  Patient presents with  . Cough    Patient went to [facility] on 01/03/2019, was diagnosed with Flu and sinus infection. Patient states he still has some cough and sinusitis.   Marland Kitchen Headache  . Sinusitis  . Nasal Congestion   Still having a dry hacking cough lingering since sinus infection almost 2 months ago. Given abx from UC last month with some mild improvement of sxs, called back worse a week or so later and dx'd with the flu. Treated for that and now just having the hacking cough and residual congestion, sinus pain and pressure. Headaches every morning until he can get all the congestion out. Taking mucinex, sudafed, and OTC cough medication. Hx of allergies on allegra and flonase regimen. Denies fevers, chills, body aches, SOB.   Relevant past medical, surgical, family and social history reviewed and updated as indicated. Interim medical history since our last visit reviewed. Allergies and medications reviewed and updated.  Review of Systems  Per HPI unless specifically indicated above     Objective:    BP 110/73 (BP Location: Left Arm, Patient Position: Sitting, Cuff Size: Normal)   Pulse 83   Temp 98.1 F (36.7 C) (Oral)   SpO2 96%   Wt Readings from Last 3 Encounters:  11/13/18 192 lb (87.1 kg)  08/09/18 183 lb (83 kg)  07/25/18 184 lb 6.4 oz (83.6 kg)    Physical Exam Vitals signs and nursing note reviewed.  Constitutional:      Appearance: He is well-developed.  HENT:     Head: Atraumatic.     Right Ear: Tympanic membrane and external ear normal.     Left Ear: Tympanic membrane and external ear normal.     Nose: Congestion present.     Comments: Nasal mucosa  erythematous and edematous    Mouth/Throat:     Mouth: Mucous membranes are moist.     Pharynx: Posterior oropharyngeal erythema present. No oropharyngeal exudate.  Eyes:     Conjunctiva/sclera: Conjunctivae normal.     Pupils: Pupils are equal, round, and reactive to light.  Neck:     Musculoskeletal: Normal range of motion and neck supple.  Cardiovascular:     Rate and Rhythm: Normal rate and regular rhythm.  Pulmonary:     Effort: Pulmonary effort is normal. No respiratory distress.     Breath sounds: No wheezing or rales.  Musculoskeletal: Normal range of motion.  Lymphadenopathy:     Cervical: No cervical adenopathy.  Skin:    General: Skin is warm and dry.  Neurological:     Mental Status: He is alert and oriented to person, place, and time.  Psychiatric:        Behavior: Behavior normal.     Results for orders placed or performed in visit on 11/13/18  Comprehensive metabolic panel  Result Value Ref Range   Glucose 95 65 - 99 mg/dL   BUN 18 6 - 24 mg/dL   Creatinine, Ser 3.83 0.76 - 1.27 mg/dL   GFR calc non Af Amer 106 >59 mL/min/1.73   GFR calc Af Amer 123 >59 mL/min/1.73  BUN/Creatinine Ratio 21 (H) 9 - 20   Sodium 140 134 - 144 mmol/L   Potassium 4.1 3.5 - 5.2 mmol/L   Chloride 99 96 - 106 mmol/L   CO2 24 20 - 29 mmol/L   Calcium 9.0 8.7 - 10.2 mg/dL   Total Protein 6.4 6.0 - 8.5 g/dL   Albumin 4.5 3.5 - 5.5 g/dL   Globulin, Total 1.9 1.5 - 4.5 g/dL   Albumin/Globulin Ratio 2.4 (H) 1.2 - 2.2   Bilirubin Total 0.6 0.0 - 1.2 mg/dL   Alkaline Phosphatase 54 39 - 117 IU/L   AST 19 0 - 40 IU/L   ALT 31 0 - 44 IU/L  Lipid panel  Result Value Ref Range   Cholesterol, Total 240 (H) 100 - 199 mg/dL   Triglycerides 81 0 - 149 mg/dL   HDL 55 >76 mg/dL   VLDL Cholesterol Cal 16 5 - 40 mg/dL   LDL Calculated 808 (H) 0 - 99 mg/dL   Chol/HDL Ratio 4.4 0.0 - 5.0 ratio  CBC with Differential/Platelet  Result Value Ref Range   WBC 7.2 3.4 - 10.8 x10E3/uL   RBC 5.14  4.14 - 5.80 x10E6/uL   Hemoglobin 15.4 13.0 - 17.7 g/dL   Hematocrit 81.1 03.1 - 51.0 %   MCV 86 79 - 97 fL   MCH 30.0 26.6 - 33.0 pg   MCHC 34.9 31.5 - 35.7 g/dL   RDW 59.4 58.5 - 92.9 %   Platelets 254 150 - 450 x10E3/uL   Neutrophils 54 Not Estab. %   Lymphs 35 Not Estab. %   Monocytes 7 Not Estab. %   Eos 3 Not Estab. %   Basos 1 Not Estab. %   Neutrophils Absolute 3.9 1.4 - 7.0 x10E3/uL   Lymphocytes Absolute 2.5 0.7 - 3.1 x10E3/uL   Monocytes Absolute 0.5 0.1 - 0.9 x10E3/uL   EOS (ABSOLUTE) 0.2 0.0 - 0.4 x10E3/uL   Basophils Absolute 0.0 0.0 - 0.2 x10E3/uL   Immature Granulocytes 0 Not Estab. %   Immature Grans (Abs) 0.0 0.0 - 0.1 x10E3/uL  TSH  Result Value Ref Range   TSH 2.320 0.450 - 4.500 uIU/mL  Urinalysis, Routine w reflex microscopic  Result Value Ref Range   Specific Gravity, UA 1.015 1.005 - 1.030   pH, UA 6.0 5.0 - 7.5   Color, UA Yellow Yellow   Appearance Ur Clear Clear   Leukocytes, UA Negative Negative   Protein, UA Negative Negative/Trace   Glucose, UA Negative Negative   Ketones, UA Negative Negative   RBC, UA Negative Negative   Bilirubin, UA Negative Negative   Urobilinogen, Ur 0.2 0.2 - 1.0 mg/dL   Nitrite, UA Negative Negative      Assessment & Plan:   Problem List Items Addressed This Visit      Respiratory   Allergic rhinitis    Continue allegra and BID flonase, sinus rinses, humidifier       Other Visit Diagnoses    Acute maxillary sinusitis, recurrence not specified    -  Primary   Will start zpak and prednisone, continue mucinex and allergy regimen, sinus rinses, and other supportive care. Return precautions reviewed   Relevant Medications   predniSONE (DELTASONE) 10 MG tablet   azithromycin (ZITHROMAX) 250 MG tablet   Cough       Suspect post-viral inflammation, starting abx for sinusitis and will start prednisone taper for cough. Continue OTC cough suppressants       Follow up plan: Return  if symptoms worsen or fail to  improve.

## 2019-02-27 ENCOUNTER — Encounter: Payer: Self-pay | Admitting: Family Medicine

## 2019-02-27 ENCOUNTER — Telehealth: Payer: Self-pay | Admitting: Family Medicine

## 2019-02-27 ENCOUNTER — Other Ambulatory Visit: Payer: Self-pay

## 2019-02-27 ENCOUNTER — Ambulatory Visit (INDEPENDENT_AMBULATORY_CARE_PROVIDER_SITE_OTHER): Payer: Managed Care, Other (non HMO) | Admitting: Family Medicine

## 2019-02-27 VITALS — Temp 98.9°F | Ht 70.0 in | Wt 206.0 lb

## 2019-02-27 DIAGNOSIS — R05 Cough: Secondary | ICD-10-CM | POA: Diagnosis not present

## 2019-02-27 DIAGNOSIS — J3089 Other allergic rhinitis: Secondary | ICD-10-CM

## 2019-02-27 MED ORDER — MONTELUKAST SODIUM 10 MG PO TABS: 10.0000 mg | ORAL_TABLET | Freq: Every day | ORAL | 3 refills | Status: DC

## 2019-02-27 MED ORDER — CETIRIZINE HCL 10 MG PO TABS: 10.0000 mg | ORAL_TABLET | Freq: Every day | ORAL | 6 refills | Status: DC

## 2019-02-27 NOTE — Progress Notes (Signed)
Temp 98.9 F (37.2 C) (Oral)   Ht 5\' 10"  (1.778 m)   Wt 206 lb (93.4 kg)   BMI 29.56 kg/m    Subjective:    Patient ID: Lance Walsh, male    DOB: January 30, 1976, 43 y.o.   MRN: [id number]  HPI: EDAHI Lance Walsh is a 43 y.o. male  Chief Complaint  Patient presents with  . Cough    f/u pt states has been coughing yellow mucus x about 3 days  . Sinusitis    when he first weak up in the morning headache    . This visit was completed via WebEx due to the restrictions of the COVID-19 pandemic. All issues as above were discussed and addressed. Physical exam was done as above through visual confirmation on WebEx. If it was felt that the patient should be evaluated in the office, they were directed there. The patient verbally consented to this visit. . Location of the patient: home . Location of the provider: home . Those involved with this call:  . Provider: Roosvelt Maser, PA-C . CMA: Tiffany Reel, CMA . Front Desk/Registration: Harriet Pho  . Time spent on call: 15 minutes with patient face to face via video conference. More than 50% of this time was spent in counseling and coordination of care.   Pt c/o about 3 days of productive cough and sinus drainage and pressure that is worst in the mornings, better throughout the day. Denies fevers, chills, CP, SOB, body aches, sore throat. Has known hx of allergic rhinitis currently on zyrtec and flonase regimen which seemed to help initially but not as much right now. No sick contacts recently. Not trying anything else OTC for sxs.   Relevant past medical, surgical, family and social history reviewed and updated as indicated. Interim medical history since our last visit reviewed. Allergies and medications reviewed and updated.  Review of Systems  Per HPI unless specifically indicated above     Objective:    Temp 98.9 F (37.2 C) (Oral)   Ht 5\' 10"  (1.778 m)   Wt 206 lb (93.4 kg)   BMI 29.56 kg/m   Wt Readings from Last 3 Encounters:   02/27/19 206 lb (93.4 kg)  11/13/18 192 lb (87.1 kg)  08/09/18 183 lb (83 kg)    Physical Exam Vitals signs and nursing note reviewed.  Constitutional:      General: He is not in acute distress.    Appearance: Normal appearance.  HENT:     Head: Atraumatic.     Right Ear: External ear normal.     Left Ear: External ear normal.     Nose: Rhinorrhea present.     Mouth/Throat:     Mouth: Mucous membranes are moist.     Pharynx: Oropharynx is clear. Posterior oropharyngeal erythema (posteriorly) present. No oropharyngeal exudate.  Eyes:     Extraocular Movements: Extraocular movements intact.     Conjunctiva/sclera: Conjunctivae normal.  Neck:     Musculoskeletal: Normal range of motion.  Pulmonary:     Effort: Pulmonary effort is normal. No respiratory distress.  Musculoskeletal: Normal range of motion.  Skin:    General: Skin is dry.     Findings: No erythema or rash.  Neurological:     Mental Status: He is oriented to person, place, and time.  Psychiatric:        Mood and Affect: Mood normal.        Thought Content: Thought content normal.  Judgment: Judgment normal.     Results for orders placed or performed in visit on 11/13/18  Comprehensive metabolic panel  Result Value Ref Range   Glucose 95 65 - 99 mg/dL   BUN 18 6 - 24 mg/dL   Creatinine, Ser 9.75 0.76 - 1.27 mg/dL   GFR calc non Af Amer 106 >59 mL/min/1.73   GFR calc Af Amer 123 >59 mL/min/1.73   BUN/Creatinine Ratio 21 (H) 9 - 20   Sodium 140 134 - 144 mmol/L   Potassium 4.1 3.5 - 5.2 mmol/L   Chloride 99 96 - 106 mmol/L   CO2 24 20 - 29 mmol/L   Calcium 9.0 8.7 - 10.2 mg/dL   Total Protein 6.4 6.0 - 8.5 g/dL   Albumin 4.5 3.5 - 5.5 g/dL   Globulin, Total 1.9 1.5 - 4.5 g/dL   Albumin/Globulin Ratio 2.4 (H) 1.2 - 2.2   Bilirubin Total 0.6 0.0 - 1.2 mg/dL   Alkaline Phosphatase 54 39 - 117 IU/L   AST 19 0 - 40 IU/L   ALT 31 0 - 44 IU/L  Lipid panel  Result Value Ref Range   Cholesterol, Total  240 (H) 100 - 199 mg/dL   Triglycerides 81 0 - 149 mg/dL   HDL 55 >88 mg/dL   VLDL Cholesterol Cal 16 5 - 40 mg/dL   LDL Calculated 325 (H) 0 - 99 mg/dL   Chol/HDL Ratio 4.4 0.0 - 5.0 ratio  CBC with Differential/Platelet  Result Value Ref Range   WBC 7.2 3.4 - 10.8 x10E3/uL   RBC 5.14 4.14 - 5.80 x10E6/uL   Hemoglobin 15.4 13.0 - 17.7 g/dL   Hematocrit 49.8 26.4 - 51.0 %   MCV 86 79 - 97 fL   MCH 30.0 26.6 - 33.0 pg   MCHC 34.9 31.5 - 35.7 g/dL   RDW 15.8 30.9 - 40.7 %   Platelets 254 150 - 450 x10E3/uL   Neutrophils 54 Not Estab. %   Lymphs 35 Not Estab. %   Monocytes 7 Not Estab. %   Eos 3 Not Estab. %   Basos 1 Not Estab. %   Neutrophils Absolute 3.9 1.4 - 7.0 x10E3/uL   Lymphocytes Absolute 2.5 0.7 - 3.1 x10E3/uL   Monocytes Absolute 0.5 0.1 - 0.9 x10E3/uL   EOS (ABSOLUTE) 0.2 0.0 - 0.4 x10E3/uL   Basophils Absolute 0.0 0.0 - 0.2 x10E3/uL   Immature Granulocytes 0 Not Estab. %   Immature Grans (Abs) 0.0 0.0 - 0.1 x10E3/uL  TSH  Result Value Ref Range   TSH 2.320 0.450 - 4.500 uIU/mL  Urinalysis, Routine w reflex microscopic  Result Value Ref Range   Specific Gravity, UA 1.015 1.005 - 1.030   pH, UA 6.0 5.0 - 7.5   Color, UA Yellow Yellow   Appearance Ur Clear Clear   Leukocytes, UA Negative Negative   Protein, UA Negative Negative/Trace   Glucose, UA Negative Negative   Ketones, UA Negative Negative   RBC, UA Negative Negative   Bilirubin, UA Negative Negative   Urobilinogen, Ur 0.2 0.2 - 1.0 mg/dL   Nitrite, UA Negative Negative      Assessment & Plan:   Problem List Items Addressed This Visit      Respiratory   Allergic rhinitis - Primary    Under poor control first thing in the morning. Add singulair to current regimen, humidifier and sinus rinses prn. F/u if worsening or not improving          Follow  up plan: Return if symptoms worsen or fail to improve.

## 2019-02-27 NOTE — Telephone Encounter (Signed)
Please get him scheduled for video visit  Copied from CRM [id number]. Topic: General - Other >> Feb 26, 2019 10:40 PM Floria Raveling A wrote: Reason for CRM: pt called in left vm on general mailbox- State he was seen last week by Roosvelt Maser for a sinus infection.  He stated that the cough is trying to come back and he is blowing out yellow mucus for his nose.  He wants to know if he need to come back in for something else can be called in for him ?

## 2019-03-04 NOTE — Assessment & Plan Note (Signed)
Under poor control first thing in the morning. Add singulair to current regimen, humidifier and sinus rinses prn. F/u if worsening or not improving

## 2019-04-06 ENCOUNTER — Other Ambulatory Visit: Payer: Self-pay

## 2019-04-06 ENCOUNTER — Ambulatory Visit
Admission: EM | Admit: 2019-04-06 | Discharge: 2019-04-06 | Disposition: A | Discharge status: Home / Self Care | Payer: Worker's Compensation | Attending: Family Medicine | Admitting: Family Medicine

## 2019-04-06 ENCOUNTER — Encounter: Payer: Self-pay | Admitting: Emergency Medicine

## 2019-04-06 DIAGNOSIS — W319XXA Contact with unspecified machinery, initial encounter: Secondary | ICD-10-CM

## 2019-04-06 DIAGNOSIS — Z23 Encounter for immunization: Secondary | ICD-10-CM | POA: Diagnosis not present

## 2019-04-06 DIAGNOSIS — Z042 Encounter for examination and observation following work accident: Secondary | ICD-10-CM

## 2019-04-06 DIAGNOSIS — S61412A Laceration without foreign body of left hand, initial encounter: Secondary | ICD-10-CM

## 2019-04-06 MED ORDER — TETANUS-DIPHTH-ACELL PERTUSSIS 5-2.5-18.5 LF-MCG/0.5 IM SUSP
0.5000 mL | Freq: Once | INTRAMUSCULAR | Status: AC
  Administered 2019-04-06: 0.5 mL via INTRAMUSCULAR

## 2019-04-06 NOTE — Discharge Instructions (Addendum)
It was very nice meeting you today in clinic. Thank you for entrusting me with your care.   As discussed, keep hands clean and dry. WEAR splint to prevent sutures from coming out. Use antibiotic ointment twice a day for the next few days.   Come back in 7-10 days to have stitches taken out. If they come out before then, or you are concerned about infection, please seek follow up care either here or in the ER. Please remember, our [facility] providers are "right here with you" when you need [place].   Again, it was my pleasure to take care of you today. Thank you for choosing our clinic. I hope that you start to feel better quickly.   Quentin Mulling, MSN, APRN, FNP-C, CEN Advanced Practice Provider [facility]

## 2019-04-06 NOTE — ED Provider Notes (Signed)
[address] [place], [place] [address] [phone]   Name: Lance Walsh DOB: 07-14-1976 MRN: [id number] CSN: [id number] PCP: Steele Sizer, MD  Arrival date and time:  04/06/19 0846  Chief Complaint:  Laceration  NOTE: Prior to seeing the patient today, I have reviewed the triage nursing documentation and vital signs. Clinical staff has updated patient's PMH/PSHx, current medication list, and drug allergies/intolerances to ensure comprehensive history available to assist in medical decision making.   History:   HPI: Lance Walsh is a 43 y.o. male who presents today with complaints of a laceration to his LEFT hand. Patient notes that he was performing his normal duties today at work when the incident occurred. He describes that he was "cleaning the rollers" with sandpaper with the assistance from his co-worker. Patient states, "I was holding the sandpaper and he was pushing down on it. The sandpaper got caught on the roller and it dragged it against my hand". Incident occurred at approximately 0500 this morning. Patient immediately cleaned the wound and covered it.  Patient presents today with a 1.5 cm jagged laceration to the base of the 1st digit on his LEFT hand. Bleeding controlled. Patient is NOT up to date on his tetanus vaccination.   Past Medical History:  Diagnosis Date  . Bipolar 1 disorder (HCC)   . Calculus of kidney   . Depression   . Fatty liver   . Plantar fasciitis     History reviewed. No pertinent surgical history.  Family History  Problem Relation Age of Onset  . Alcohol abuse Mother   . Diabetes Mother   . Cancer Mother   . Diabetes Sister   . Cancer Sister     Social History   Socioeconomic History  . Marital status: Married    Spouse name: Not on file  . Number of children: Not on file  . Years of education: Not on file  . Highest education level: Not on file  Occupational History  . Not on file  Social Needs  . Financial  resource strain: Not on file  . Food insecurity:    Worry: Not on file    Inability: Not on file  . Transportation needs:    Medical: Not on file    Non-medical: Not on file  Tobacco Use  . Smoking status: Never Smoker  . Smokeless tobacco: Never Used  Substance and Sexual Activity  . Alcohol use: Yes  . Drug use: No  . Sexual activity: Not on file  Lifestyle  . Physical activity:    Days per week: Not on file    Minutes per session: Not on file  . Stress: Not on file  Relationships  . Social connections:    Talks on phone: Not on file    Gets together: Not on file    Attends religious service: Not on file    Active member of club or organization: Not on file    Attends meetings of clubs or organizations: Not on file    Relationship status: Not on file  . Intimate partner violence:    Fear of current or ex partner: Not on file    Emotionally abused: Not on file    Physically abused: Not on file    Forced sexual activity: Not on file  Other Topics Concern  . Not on file  Social History Narrative  . Not on file    Patient Active Problem List   Diagnosis Date Noted  .  Allergic rhinitis 02/13/2019  . Insomnia 11/13/2018  . Migraine headache 12/29/2016  . Depression 11/04/2015    Home Medications:    Current Meds  Medication Sig  . amitriptyline (ELAVIL) 75 MG tablet Take 1 tablet (75 mg total) by mouth at bedtime.  Marland Kitchen. azelastine (OPTIVAR) 0.05 % ophthalmic solution INSTILL 1 DROP IN AFFECTED EYES TWICE A DAY.  . cetirizine (ZYRTEC) 10 MG tablet Take 1 tablet (10 mg total) by mouth daily.  . clonazePAM (KLONOPIN) 1 MG tablet Take 1 tablet (1 mg total) by mouth 2 (two) times daily as needed.  Marland Kitchen. EPINEPHrine (EPIPEN IJ) Inject as directed as needed.  . fluticasone (FLONASE) 50 MCG/ACT nasal spray Place 2 sprays into both nostrils daily.  . meloxicam (MOBIC) 15 MG tablet Take 1 tablet (15 mg total) by mouth daily.  . montelukast (SINGULAIR) 10 MG tablet Take 1 tablet  (10 mg total) by mouth at bedtime.  Marland Kitchen. omeprazole (PRILOSEC) 20 MG capsule Take 1 capsule (20 mg total) by mouth 2 (two) times daily before a meal.  . SUMAtriptan (IMITREX) 100 MG tablet Take 1 tablet (100 mg total) by mouth every 2 (two) hours as needed for migraine. May repeat in 2 hours if headache persists or recurs.    Allergies:   Shellfish-derived products and Lac bovis  Review of Systems (ROS): Review of Systems  Constitutional: Negative for chills and fever.  Respiratory: Negative for cough and shortness of breath.   Cardiovascular: Negative for chest pain and palpitations.  Skin: Positive for wound (LEFT hand).  All other systems reviewed and are negative.    Physical Exam:  Triage Vital Signs ED Triage Vitals  Enc Vitals Group     BP 04/06/19 0920 125/89     Pulse Rate 04/06/19 0920 82     Resp 04/06/19 0920 18     Temp 04/06/19 0920 98.6 F (37 C)     Temp Source 04/06/19 0920 Oral     SpO2 04/06/19 0920 98 %     Weight 04/06/19 0918 207 lb (93.9 kg)     Height 04/06/19 0918 5\' 10"  (1.778 m)     Head Circumference --      Peak Flow --      Pain Score 04/06/19 0918 1     Pain Loc --      Pain Edu? --      Excl. in GC? --     Physical Exam  Constitutional: He is oriented to person, place, and time and well-developed, well-nourished, and in no distress.  HENT:  Head: Normocephalic and atraumatic.  Mouth/Throat: Mucous membranes are normal.  Cardiovascular: Normal rate, regular rhythm, normal heart sounds and intact distal pulses. Exam reveals no gallop and no friction rub.  No murmur heard. Pulmonary/Chest: Effort normal and breath sounds normal. No respiratory distress. He has no wheezes. He has no rales.  Neurological: He is alert and oriented to person, place, and time.  Skin: Skin is warm and dry. Laceration (1.5 cm jagged laceration to the base of the 1st digit on the LEFT hand; bleeding controlled) noted. No rash noted.  Psychiatric: Mood, affect and  judgment normal.  Nursing note and vitals reviewed.    Urgent Care Treatments / Results:   LABS: PLEASE NOTE: all labs that were ordered this encounter are listed, however only abnormal results are displayed. Labs Reviewed - No data to display  EKG: No orders found for this or any previous visit.  RADIOLOGY: No results found.  PRODEDURES: Laceration Repair Performed by: Verlee Monte, NP Authorized by: Verlee Monte, NP   Consent:    Consent obtained:  Verbal   Consent given by:  Patient   Risks discussed:  Infection, need for additional repair, pain, poor cosmetic result and poor wound healing   Alternatives discussed:  No treatment, delayed treatment and referral Universal protocol:    Procedure explained and questions answered to patient or proxy's satisfaction: yes     Relevant documents present and verified: yes     Test results available and properly labeled: yes     Imaging studies available: yes     Required blood products, implants, devices, and special equipment available: yes     Site/side marked: yes     Immediately prior to procedure, a time out was called: yes     Patient identity confirmed:  Verbally with patient Anesthesia (see MAR for exact dosages):    Anesthesia method:  Local infiltration   Local anesthetic:  Lidocaine 1% w/o epi Laceration details:    Location:  Finger   Finger location:  L thumb   Length (cm):  1.5 Repair type:    Repair type:  Simple Pre-procedure details:    Preparation:  Patient was prepped and draped in usual sterile fashion Exploration:    Hemostasis achieved with:  Direct pressure   Wound exploration: wound explored through full range of motion and entire depth of wound probed and visualized     Wound extent: fascia violated     Wound extent: no foreign bodies/material noted, no muscle damage noted, no nerve damage noted, no tendon damage noted, no underlying fracture noted and no vascular damage noted     Contaminated:  no   Treatment:    Area cleansed with:  Betadine, saline and soap and water   Amount of cleaning:  Standard   Irrigation solution:  Sterile saline   Irrigation method:  Syringe Skin repair:    Repair method:  Sutures   Suture size:  5-0   Suture material:  Prolene   Suture technique:  Simple interrupted   Number of sutures:  4 Approximation:    Approximation:  Close Post-procedure details:    Dressing:  Antibiotic ointment, splint for protection and sterile dressing   Patient tolerance of procedure:  Tolerated well, no immediate complications   MEDICATIONS RECEIVED THIS VISIT: Medications  Tdap (BOOSTRIX) injection 0.5 mL (0.5 mLs Intramuscular Given 04/06/19 1014)    PERTINENT CLINICAL COURSE NOTES/UPDATES: No data to display   Initial Impression / Assessment and Plan / Urgent Care Course:    Lance Walsh is a 43 y.o. male who presents to [facility] today with complaints of Laceration  Pertinent labs & imaging results that were available during my care of the patient were personally reviewed by me and considered in my medical decision making (see lab/imaging section of note for values and interpretations).  Exam revealed a 1.5 cm jagged laceration to base of LEFT thumb. No concern for FB or tendon or nerve involvement. Patient with full function and sensation of digit prior to closure. Wound closed (see notes). Splint applied to prevent suture disruption. Patient educated on need to keep wound clean and dry. Recommended TAO at least BID for the next few days. Patient to RTC in 7-10 days to have sutures removed.   Current clinical condition warrants duty restrictions at  work in order to recover from his current injury. He was provided with the appropriate documentation  to provide to his place of employment that will allow for him to be on light duty until suture removed. Patient must have splint in place if he is going to work.    I have reviewed the follow up and strict  return precautions for any new or worsening symptoms. Patient is aware of symptoms that would be deemed urgent/emergent, and would thus require further evaluation either here or in the emergency department. At the time of discharge, he verbalized understanding and consent with the discharge plan as it was reviewed with him. All questions were fielded by provider and/or clinic staff prior to patient discharge.    Final Clinical Impressions(s) / Urgent Care Diagnoses:   Final diagnoses:  Laceration of left hand without foreign body, initial encounter    New Prescriptions:   Meds ordered this encounter  Medications  . Tdap (BOOSTRIX) injection 0.5 mL    Controlled Substance Prescriptions:  [place] Controlled Substance Registry consulted? Not Applicable  NOTE: This note was prepared using Dragon dictation software along with smaller phrase technology. Despite my best ability to proofread, there is the potential that transcriptional errors may still occur from this process, and are completely unintentional.     Verlee Monte, NP 04/06/19 1138

## 2019-04-06 NOTE — ED Triage Notes (Signed)
Patient c/o laceration to left hand in between his thumb and 1st finger this morning. He states he cut it on sand paper.

## 2019-09-28 ENCOUNTER — Other Ambulatory Visit: Payer: Self-pay | Admitting: Family Medicine

## 2019-10-08 ENCOUNTER — Encounter: Payer: Self-pay | Admitting: Family Medicine

## 2019-11-01 ENCOUNTER — Encounter: Payer: Self-pay | Admitting: Family Medicine

## 2019-11-01 ENCOUNTER — Other Ambulatory Visit: Payer: Self-pay

## 2019-11-01 ENCOUNTER — Ambulatory Visit (INDEPENDENT_AMBULATORY_CARE_PROVIDER_SITE_OTHER): Payer: Managed Care, Other (non HMO) | Admitting: Family Medicine

## 2019-11-01 ENCOUNTER — Telehealth: Payer: Self-pay | Admitting: Family Medicine

## 2019-11-01 VITALS — Ht 70.0 in | Wt 204.0 lb

## 2019-11-01 DIAGNOSIS — Z20822 Contact with and (suspected) exposure to covid-19: Secondary | ICD-10-CM

## 2019-11-01 DIAGNOSIS — Z20828 Contact with and (suspected) exposure to other viral communicable diseases: Secondary | ICD-10-CM

## 2019-11-01 NOTE — Progress Notes (Signed)
Ht 5\' 10"  (1.778 m)   Wt 204 lb (92.5 kg)   BMI 29.27 kg/m    Subjective:    Patient ID: Lance Walsh, male    DOB: 1975-12-21, 43 y.o.   MRN: [id number]  HPI: Lance Walsh is a 43 y.o. male  Chief Complaint  Patient presents with  . Cough    since last Saturday  . Generalized Body Aches  . Chills  . Headache  . Diarrhea    . This visit was completed via WebEx due to the restrictions of the COVID-19 pandemic. All issues as above were discussed and addressed. Physical exam was done as above through visual confirmation on WebEx. If it was felt that the patient should be evaluated in the office, they were directed there. The patient verbally consented to this visit. . Location of the patient: home . Location of the provider: work . Those involved with this call:  . Provider: Merrie Roof, PA-C . CMA: Lesle Chris, [place] . Front Desk/Registration: Jill Side  . Time spent on call: 15 minutes with patient face to face via video conference. More than 50% of this time was spent in counseling and coordination of care. 5 minutes total spent in review of patient's record and preparation of their chart. I verified patient identity using two factors (patient name and date of birth). Patient consents verbally to being seen via telemedicine visit today.   Work note from yesterday. Was swabbed this morning. Cough, diarrhea, chills, headache, generalized body aches, congestion for about 4 days now. Wife sick with same sxs and had a positive COVID exposure at work the past week. Taking dayquil and mucinex with mild temporary reflief. Was tested for COVID this morning. Needing a work note for yesterday through quarantine period.    Relevant past medical, surgical, family and social history reviewed and updated as indicated. Interim medical history since our last visit reviewed. Allergies and medications reviewed and updated.  Review of Systems  Per HPI unless specifically indicated above      Objective:    Ht 5\' 10"  (1.778 m)   Wt 204 lb (92.5 kg)   BMI 29.27 kg/m   Wt Readings from Last 3 Encounters:  11/01/19 204 lb (92.5 kg)  04/06/19 207 lb (93.9 kg)  02/27/19 206 lb (93.4 kg)    Physical Exam Vitals signs and nursing note reviewed.  Constitutional:      General: He is not in acute distress.    Appearance: Normal appearance.  HENT:     Head: Atraumatic.     Right Ear: External ear normal.     Left Ear: External ear normal.     Nose: Congestion present.     Mouth/Throat:     Mouth: Mucous membranes are moist.     Pharynx: Oropharynx is clear. Posterior oropharyngeal erythema present.  Eyes:     Extraocular Movements: Extraocular movements intact.     Conjunctiva/sclera: Conjunctivae normal.  Neck:     Musculoskeletal: Normal range of motion.  Pulmonary:     Effort: Pulmonary effort is normal. No respiratory distress.  Musculoskeletal: Normal range of motion.  Skin:    General: Skin is dry.     Findings: No erythema or rash.  Neurological:     Mental Status: He is oriented to person, place, and time.  Psychiatric:        Mood and Affect: Mood normal.        Thought Content: Thought content normal.  Judgment: Judgment normal.     Results for orders placed or performed in visit on 11/01/19  Novel Coronavirus, NAA ([facility])   Specimen: Nasopharyngeal(NP) swabs in vial transport medium   NASOPHARYNGE  TESTING  Result Value Ref Range   SARS-CoV-2, NAA Detected (A) Not Detected      Assessment & Plan:   Problem List Items Addressed This Visit    None    Visit Diagnoses    Suspected COVID-19 virus infection    -  Primary   Await test results, continue good supportive home care and quarantine. WOrk note given. Follow up if worsening sxs or not improving       Follow up plan: No follow-ups on file.

## 2019-11-01 NOTE — Telephone Encounter (Signed)
Will discuss at OV this afternoon  Copied from [place] [id number]. Topic: General - Other >> Nov 01, 2019 11:38 AM Lennox Solders wrote:  Reason for CRM:pt needs a work note  from 10-31-2019 to be quarantine until covid 19 results. 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 Please put letter in [place]

## 2019-11-03 ENCOUNTER — Encounter: Payer: Self-pay | Admitting: Family Medicine

## 2019-11-04 ENCOUNTER — Encounter: Payer: Self-pay | Admitting: Family Medicine

## 2019-11-04 LAB — NOVEL CORONAVIRUS, NAA: SARS-CoV-2, NAA: DETECTED — AB

## 2019-11-05 ENCOUNTER — Other Ambulatory Visit: Payer: Self-pay | Admitting: Family Medicine

## 2019-11-05 MED ORDER — AMOXICILLIN-POT CLAVULANATE 875-125 MG PO TABS: 1.0000 | ORAL_TABLET | Freq: Two times a day (BID) | ORAL | 0 refills | Status: DC

## 2019-11-05 MED ORDER — SUMATRIPTAN SUCCINATE 100 MG PO TABS: 100.0000 mg | ORAL_TABLET | ORAL | 11 refills | Status: DC | PRN

## 2019-11-06 ENCOUNTER — Other Ambulatory Visit: Payer: Self-pay | Admitting: Family Medicine

## 2019-11-12 ENCOUNTER — Encounter: Payer: Self-pay | Admitting: Family Medicine

## 2019-11-13 ENCOUNTER — Other Ambulatory Visit: Payer: Self-pay | Admitting: Family Medicine

## 2019-11-15 ENCOUNTER — Encounter: Payer: Managed Care, Other (non HMO) | Admitting: Family Medicine

## 2019-12-18 ENCOUNTER — Other Ambulatory Visit: Payer: Self-pay

## 2019-12-18 ENCOUNTER — Ambulatory Visit (INDEPENDENT_AMBULATORY_CARE_PROVIDER_SITE_OTHER): Payer: Managed Care, Other (non HMO)

## 2019-12-18 ENCOUNTER — Other Ambulatory Visit: Payer: Self-pay | Admitting: Podiatry

## 2019-12-18 ENCOUNTER — Ambulatory Visit: Payer: Managed Care, Other (non HMO) | Admitting: Podiatry

## 2019-12-18 ENCOUNTER — Encounter: Payer: Self-pay | Admitting: Podiatry

## 2019-12-18 DIAGNOSIS — M722 Plantar fascial fibromatosis: Secondary | ICD-10-CM

## 2019-12-18 DIAGNOSIS — M779 Enthesopathy, unspecified: Secondary | ICD-10-CM

## 2019-12-18 MED ORDER — METHYLPREDNISOLONE 4 MG PO TBPK: ORAL_TABLET | ORAL | 0 refills | Status: DC

## 2019-12-20 NOTE — Progress Notes (Signed)
   Subjective: 44 y.o. male presenting today as a new patient with a chief complaint of sharp, stabbing right heel pain that has been ongoing for the past 8 months. He reports associated swelling and cramping in the toes. Being on the foot increases the pain. Standing after being seated for a long period of time exacerbates the pain. He has been using ice therapy, stretching, using a tens unit, taking Meloxicam and using Voltaren gel for treatment. Patient is here for further evaluation and treatment.   Past Medical History:  Diagnosis Date  . Bipolar 1 disorder (HCC)   . Calculus of kidney   . Depression   . Fatty liver   . Plantar fasciitis      Objective: Physical Exam General: The patient is alert and oriented x3 in no acute distress.  Dermatology: Skin is warm, dry and supple bilateral lower extremities. Negative for open lesions or macerations bilateral.   Vascular: Dorsalis Pedis and Posterior Tibial pulses palpable bilateral.  Capillary fill time is immediate to all digits.  Neurological: Epicritic and protective threshold intact bilateral.   Musculoskeletal: Tenderness to palpation to the plantar aspect of the right heel along the plantar fascia. All other joints range of motion within normal limits bilateral. Strength 5/5 in all groups bilateral.   Radiographic exam: Normal osseous mineralization. Joint spaces preserved. No fracture/dislocation/boney destruction. No other soft tissue abnormalities or radiopaque foreign bodies.   Assessment: 1. Plantar fasciitis right  Plan of Care:  1. Patient evaluated. Xrays reviewed.   2. Injection of 0.5cc Celestone soluspan injected into the right plantar fascia  3. Rx for Medrol Dose Pack placed. Then resume taking Meloxicam.  4. Plantar fascial band(s) dispensed 5. Instructed patient regarding therapies and modalities at home to alleviate symptoms.  6. Return to clinic in 4 weeks.    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    Felecia Shelling, DPM [facility]  Dr. Felecia Shelling, DPM    [address] [place], [place] [address]                Office [phone]  Fax [phone]

## 2019-12-28 ENCOUNTER — Other Ambulatory Visit: Payer: Self-pay

## 2019-12-28 ENCOUNTER — Encounter: Payer: Self-pay | Admitting: Podiatry

## 2019-12-28 ENCOUNTER — Ambulatory Visit: Payer: Managed Care, Other (non HMO) | Admitting: Podiatry

## 2019-12-28 DIAGNOSIS — M722 Plantar fascial fibromatosis: Secondary | ICD-10-CM

## 2019-12-28 DIAGNOSIS — M7671 Peroneal tendinitis, right leg: Secondary | ICD-10-CM | POA: Diagnosis not present

## 2020-01-02 ENCOUNTER — Other Ambulatory Visit: Payer: Self-pay

## 2020-01-02 ENCOUNTER — Ambulatory Visit (INDEPENDENT_AMBULATORY_CARE_PROVIDER_SITE_OTHER): Payer: Managed Care, Other (non HMO) | Admitting: Orthotics

## 2020-01-02 DIAGNOSIS — M722 Plantar fascial fibromatosis: Secondary | ICD-10-CM | POA: Diagnosis not present

## 2020-01-02 DIAGNOSIS — M7671 Peroneal tendinitis, right leg: Secondary | ICD-10-CM

## 2020-01-02 NOTE — Progress Notes (Signed)

## 2020-01-02 NOTE — Progress Notes (Signed)
   Subjective: 44 y.o. male presenting today for follow up evaluation of plantar fasciitis of the right foot. He reports sharp, stabbing pain to the right lateral foot that began about one week ago. He works 12 hour shifts and states being on his feet excessively as well as walking increases the pain. He has been using ice therapy, a plantar fascial brace, CAM boot and taking Meloxicam for treatment. Patient is here for further evaluation and treatment.   Past Medical History:  Diagnosis Date  . Bipolar 1 disorder (HCC)   . Calculus of kidney   . Depression   . Fatty liver   . Plantar fasciitis      Objective: Physical Exam General: The patient is alert and oriented x3 in no acute distress.  Dermatology: Skin is warm, dry and supple bilateral lower extremities. Negative for open lesions or macerations bilateral.   Vascular: Dorsalis Pedis and Posterior Tibial pulses palpable bilateral.  Capillary fill time is immediate to all digits.  Neurological: Epicritic and protective threshold intact bilateral.   Musculoskeletal: Pain with palpation to the insertion of the peroneal tendon of the right foot. Tenderness to palpation to the plantar aspect of the bilateral heels along the plantar fascia. All other joints range of motion within normal limits bilateral. Strength 5/5 in all groups bilateral.   Assessment: 1. plantar fasciitis right foot- improved  2. Plantar fasciitis left foot  3. Insertional peroneal tendinitis right   Plan of Care:  1. Patient evaluated. Xrays reviewed.   2. Injection of 0.5cc Celestone soluspan injected into the bilateral heels and to the insertion of the peroneal tendon of the right foot.  3. Continue taking Meloxicam.  4. Appointment with Raiford Noble, Pedorthist, for custom molded orthotics.  5. Return to clinic in 4 weeks.   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    Felecia Shelling, DPM [facility]  Dr. Felecia Shelling, DPM      [address] [place], [place] [address]                Office [phone]  Fax [phone]

## 2020-01-14 ENCOUNTER — Other Ambulatory Visit: Payer: Self-pay

## 2020-01-14 ENCOUNTER — Ambulatory Visit (INDEPENDENT_AMBULATORY_CARE_PROVIDER_SITE_OTHER): Payer: Managed Care, Other (non HMO) | Admitting: Family Medicine

## 2020-01-14 ENCOUNTER — Encounter: Payer: Self-pay | Admitting: Family Medicine

## 2020-01-14 VITALS — BP 120/84 | HR 75 | Temp 98.1°F | Ht 69.0 in | Wt 210.0 lb

## 2020-01-14 DIAGNOSIS — Z23 Encounter for immunization: Secondary | ICD-10-CM | POA: Diagnosis not present

## 2020-01-14 DIAGNOSIS — J3089 Other allergic rhinitis: Secondary | ICD-10-CM | POA: Diagnosis not present

## 2020-01-14 DIAGNOSIS — G43909 Migraine, unspecified, not intractable, without status migrainosus: Secondary | ICD-10-CM | POA: Diagnosis not present

## 2020-01-14 DIAGNOSIS — Z0000 Encounter for general adult medical examination without abnormal findings: Secondary | ICD-10-CM

## 2020-01-14 DIAGNOSIS — Z125 Encounter for screening for malignant neoplasm of prostate: Secondary | ICD-10-CM | POA: Diagnosis not present

## 2020-01-14 DIAGNOSIS — G4700 Insomnia, unspecified: Secondary | ICD-10-CM | POA: Diagnosis not present

## 2020-01-14 DIAGNOSIS — R0781 Pleurodynia: Secondary | ICD-10-CM | POA: Diagnosis not present

## 2020-01-14 LAB — UA/M W/RFLX CULTURE, ROUTINE
Bilirubin, UA: NEGATIVE
Glucose, UA: NEGATIVE
Ketones, UA: NEGATIVE
Leukocytes,UA: NEGATIVE
Nitrite, UA: NEGATIVE
Protein,UA: NEGATIVE
RBC, UA: NEGATIVE
Specific Gravity, UA: 1.01 (ref 1.005–1.030)
Urobilinogen, Ur: 0.2 mg/dL (ref 0.2–1.0)
pH, UA: 7 (ref 5.0–7.5)

## 2020-01-14 MED ORDER — PREDNISONE 20 MG PO TABS: 40.0000 mg | ORAL_TABLET | Freq: Every day | ORAL | 0 refills | Status: DC

## 2020-01-14 MED ORDER — AMITRIPTYLINE HCL 75 MG PO TABS: 75.0000 mg | ORAL_TABLET | Freq: Every day | ORAL | 4 refills | Status: DC

## 2020-01-14 MED ORDER — MONTELUKAST SODIUM 10 MG PO TABS: 10.0000 mg | ORAL_TABLET | Freq: Every day | ORAL | 3 refills | Status: DC

## 2020-01-14 MED ORDER — LEVOCETIRIZINE DIHYDROCHLORIDE 5 MG PO TABS: 5.0000 mg | ORAL_TABLET | Freq: Every evening | ORAL | 5 refills | Status: DC

## 2020-01-14 NOTE — Progress Notes (Signed)
BP 120/84   Pulse 75   Temp 98.1 F (36.7 C) (Oral)   Ht 5\' 9"  (1.753 m)   Wt 210 lb (95.3 kg)   SpO2 97%   BMI 31.01 kg/m    Subjective:    Patient ID: Lance Walsh, male    DOB: September 08, 1976, 44 y.o.   MRN: [id number]  HPI: Lance Walsh is a 44 y.o. male presenting on 01/14/2020 for comprehensive medical examination. Current medical complaints include:see below  Right ribs hurting for about 8 months now, gradually getting worse over the last few months. Twisting makes it worse. Sometimes radiates to back. Has tried rest and stretches without any relief. No known injury, CP, SOB.   Migraines still flared up, waking in the night with migraines most nights. Currently taking imitrex prn but running out quickly each month due to frequency.   Insomnia - sleeping poorly most nights, waking often. Tried trazodone in the past without much relief. Elavil may have helped some but stopped it on his own over a year ago.   Allergies acting up, consistent congestion, sinus pain and pressure, ears popping. Denies fevers, chills, CP, SOB, cough. Consistently using xyzal and flonase regimen.   He currently lives with: Interim Problems from his last visit: no  Depression Screen done today and results listed below:  Depression screen [facility] 2/9 01/14/2020 02/27/2019 11/13/2018 07/25/2018 12/29/2016  Decreased Interest 1 3 0 0 0  Down, Depressed, Hopeless 1 0 0 1 0  PHQ - 2 Score 2 3 0 1 0  Altered sleeping 1 2 1 1  -  Tired, decreased energy 2 3 1 3  -  Change in appetite 0 0 0 1 -  Feeling bad or failure about yourself  0 0 0 0 -  Trouble concentrating 0 0 0 3 -  Moving slowly or fidgety/restless 0 0 0 0 -  Suicidal thoughts 0 0 0 0 -  PHQ-9 Score 5 8 2 9  -    The patient does not have a history of falls. I did complete a risk assessment for falls. A plan of care for falls was documented.   Past Medical History:  Past Medical History:  Diagnosis Date  . Bipolar 1 disorder (HCC)   . Calculus  of kidney   . Depression   . Fatty liver   . Plantar fasciitis     Surgical History:  History reviewed. No pertinent surgical history.  Medications:  Current Outpatient Medications on File Prior to Visit  Medication Sig  . azelastine (OPTIVAR) 0.05 % ophthalmic solution INSTILL 1 DROP IN AFFECTED EYES TWICE A DAY.  . cetirizine (ZYRTEC) 10 MG tablet Take 1 tablet (10 mg total) by mouth daily.  . clonazePAM (KLONOPIN) 1 MG tablet Take 1 tablet (1 mg total) by mouth 2 (two) times daily as needed.  Marland Kitchen EPINEPHrine (EPIPEN IJ) Inject as directed as needed.  . fluticasone (FLONASE) 50 MCG/ACT nasal spray Place 2 sprays into both nostrils daily.  . meloxicam (MOBIC) 15 MG tablet Take 1 tablet (15 mg total) by mouth daily.  Marland Kitchen omeprazole (PRILOSEC) 20 MG capsule Take 1 capsule (20 mg total) by mouth 2 (two) times daily before a meal.  . SUMAtriptan (IMITREX) 100 MG tablet Take 1 tablet (100 mg total) by mouth every 2 (two) hours as needed for migraine. May repeat in 2 hours if headache persists or recurs.  . VENTOLIN HFA 108 (90 Base) MCG/ACT inhaler    No current facility-administered medications on  file prior to visit.    Allergies:  Allergies  Allergen Reactions  . Shellfish-Derived Products Swelling    Throat Closing  . Lac Bovis   . Lactose Intolerance (Gi)     Diarrhea     Social History:  Social History   Socioeconomic History  . Marital status: Married    Spouse name: Not on file  . Number of children: Not on file  . Years of education: Not on file  . Highest education level: Not on file  Occupational History  . Not on file  Tobacco Use  . Smoking status: Never Smoker  . Smokeless tobacco: Never Used  Substance and Sexual Activity  . Alcohol use: Yes  . Drug use: No  . Sexual activity: Not on file  Other Topics Concern  . Not on file  Social History Narrative  . Not on file   Social Determinants of Health   Financial Resource Strain:   . Difficulty of  Paying Living Expenses: Not on file  Food Insecurity:   . Worried About Programme researcher, broadcasting/film/video in the Last Year: Not on file  . Ran Out of Food in the Last Year: Not on file  Transportation Needs:   . Lack of Transportation (Medical): Not on file  . Lack of Transportation (Non-Medical): Not on file  Physical Activity:   . Days of Exercise per Week: Not on file  . Minutes of Exercise per Session: Not on file  Stress:   . Feeling of Stress : Not on file  Social Connections:   . Frequency of Communication with Friends and Family: Not on file  . Frequency of Social Gatherings with Friends and Family: Not on file  . Attends Religious Services: Not on file  . Active Member of Clubs or Organizations: Not on file  . Attends Banker Meetings: Not on file  . Marital Status: Not on file  Intimate Partner Violence:   . Fear of Current or Ex-Partner: Not on file  . Emotionally Abused: Not on file  . Physically Abused: Not on file  . Sexually Abused: Not on file   Social History   Tobacco Use  Smoking Status Never Smoker  Smokeless Tobacco Never Used   Social History   Substance and Sexual Activity  Alcohol Use Yes    Family History:  Family History  Problem Relation Age of Onset  . Alcohol abuse Mother   . Diabetes Mother   . Cancer Mother   . Diabetes Sister   . Cancer Sister     Past medical history, surgical history, medications, allergies, family history and social history reviewed with patient today and changes made to appropriate areas of the chart.   Review of Systems - General ROS: negative Psychological ROS: positive for - sleep disturbances Ophthalmic ROS: negative ENT ROS: positive for - nasal congestion and sinus pain Allergy and Immunology ROS: negative Hematological and Lymphatic ROS: negative Endocrine ROS: negative Breast ROS: negative for breast lumps Respiratory ROS: no cough, shortness of breath, or wheezing Cardiovascular ROS: no chest pain  or dyspnea on exertion Gastrointestinal ROS: no abdominal pain, change in bowel habits, or black or bloody stools Genito-Urinary ROS: no dysuria, trouble voiding, or hematuria Musculoskeletal ROS: negative Neurological ROS: positive for - migraine Dermatological ROS: negative All other ROS negative except what is listed above and in the HPI.      Objective:    BP 120/84   Pulse 75   Temp 98.1 F (36.7  C) (Oral)   Ht 5\' 9"  (1.753 m)   Wt 210 lb (95.3 kg)   SpO2 97%   BMI 31.01 kg/m   Wt Readings from Last 3 Encounters:  01/14/20 210 lb (95.3 kg)  11/01/19 204 lb (92.5 kg)  04/06/19 207 lb (93.9 kg)    Physical Exam Vitals and nursing note reviewed.  Constitutional:      General: He is not in acute distress.    Appearance: He is well-developed.  HENT:     Head: Atraumatic.     Right Ear: Tympanic membrane and external ear normal.     Left Ear: Tympanic membrane and external ear normal.     Nose: Congestion present.     Mouth/Throat:     Mouth: Mucous membranes are moist.     Pharynx: Posterior oropharyngeal erythema present.  Eyes:     General: No scleral icterus.    Conjunctiva/sclera: Conjunctivae normal.     Pupils: Pupils are equal, round, and reactive to light.  Cardiovascular:     Rate and Rhythm: Normal rate and regular rhythm.     Heart sounds: Normal heart sounds. No murmur.  Pulmonary:     Effort: Pulmonary effort is normal. No respiratory distress.     Breath sounds: Normal breath sounds.  Abdominal:     General: Bowel sounds are normal. There is no distension.     Palpations: Abdomen is soft. There is no mass.     Tenderness: There is no abdominal tenderness. There is no guarding.  Genitourinary:    Comments: GU exam declined Musculoskeletal:        General: No tenderness. Normal range of motion.     Cervical back: Normal range of motion and neck supple.  Skin:    General: Skin is warm and dry.     Findings: No rash.  Neurological:     General:  No focal deficit present.     Mental Status: He is alert and oriented to person, place, and time.     Deep Tendon Reflexes: Reflexes are normal and symmetric.  Psychiatric:        Mood and Affect: Mood normal.        Behavior: Behavior normal.        Thought Content: Thought content normal.        Judgment: Judgment normal.     Results for orders placed or performed in visit on 01/14/20  CBC with Differential/Platelet  Result Value Ref Range   WBC 8.3 3.4 - 10.8 x10E3/uL   RBC 5.47 4.14 - 5.80 x10E6/uL   Hemoglobin 16.6 13.0 - 17.7 g/dL   Hematocrit 01/16/20 43.3 - 51.0 %   MCV 89 79 - 97 fL   MCH 30.3 26.6 - 33.0 pg   MCHC 34.2 31.5 - 35.7 g/dL   RDW 29.5 18.8 - 41.6 %   Platelets 274 150 - 450 x10E3/uL   Neutrophils 57 Not Estab. %   Lymphs 32 Not Estab. %   Monocytes 8 Not Estab. %   Eos 2 Not Estab. %   Basos 1 Not Estab. %   Neutrophils Absolute 4.9 1.4 - 7.0 x10E3/uL   Lymphocytes Absolute 2.6 0.7 - 3.1 x10E3/uL   Monocytes Absolute 0.6 0.1 - 0.9 x10E3/uL   EOS (ABSOLUTE) 0.1 0.0 - 0.4 x10E3/uL   Basophils Absolute 0.0 0.0 - 0.2 x10E3/uL   Immature Granulocytes 0 Not Estab. %   Immature Grans (Abs) 0.0 0.0 - 0.1 x10E3/uL  Comprehensive metabolic panel  Result Value Ref Range   Glucose 81 65 - 99 mg/dL   BUN 19 6 - 24 mg/dL   Creatinine, Ser 0.94 0.76 - 1.27 mg/dL   GFR calc non Af Amer 99 >59 mL/min/1.73   GFR calc Af Amer 114 >59 mL/min/1.73   BUN/Creatinine Ratio 20 9 - 20   Sodium 140 134 - 144 mmol/L   Potassium 4.0 3.5 - 5.2 mmol/L   Chloride 100 96 - 106 mmol/L   CO2 27 20 - 29 mmol/L   Calcium 9.8 8.7 - 10.2 mg/dL   Total Protein 7.0 6.0 - 8.5 g/dL   Albumin 4.6 4.0 - 5.0 g/dL   Globulin, Total 2.4 1.5 - 4.5 g/dL   Albumin/Globulin Ratio 1.9 1.2 - 2.2   Bilirubin Total 0.5 0.0 - 1.2 mg/dL   Alkaline Phosphatase 53 39 - 117 IU/L   AST 31 0 - 40 IU/L   ALT 72 (H) 0 - 44 IU/L  Lipid Panel w/o Chol/HDL Ratio  Result Value Ref Range   Cholesterol, Total  248 (H) 100 - 199 mg/dL   Triglycerides 143 0 - 149 mg/dL   HDL 58 >39 mg/dL   VLDL Cholesterol Cal 26 5 - 40 mg/dL   LDL Chol Calc (NIH) 164 (H) 0 - 99 mg/dL  TSH  Result Value Ref Range   TSH 1.250 0.450 - 4.500 uIU/mL  UA/M w/rflx Culture, Routine   Specimen: Urine   URINE  Result Value Ref Range   Specific Gravity, UA 1.010 1.005 - 1.030   pH, UA 7.0 5.0 - 7.5   Color, UA Yellow Yellow   Appearance Ur Clear Clear   Leukocytes,UA Negative Negative   Protein,UA Negative Negative/Trace   Glucose, UA Negative Negative   Ketones, UA Negative Negative   RBC, UA Negative Negative   Bilirubin, UA Negative Negative   Urobilinogen, Ur 0.2 0.2 - 1.0 mg/dL   Nitrite, UA Negative Negative  PSA  Result Value Ref Range   Prostate Specific Ag, Serum 0.5 0.0 - 4.0 ng/mL      Assessment & Plan:   Problem List Items Addressed This Visit      Cardiovascular and Mediastinum   Migraine headache - Primary    Restart elavil, continue prn imitrex. F/u in 1 month        Respiratory   Allergic rhinitis    Add singulair to existing allergy regimen, prednisone sent for current flare. Sinus rinses, mucinex prn        Other   Insomnia    Restart elavil, work on sleep hygiene. F/u in 1 month       Other Visit Diagnoses    Annual physical exam       Relevant Orders   CBC with Differential/Platelet (Completed)   Comprehensive metabolic panel (Completed)   Lipid Panel w/o Chol/HDL Ratio (Completed)   TSH (Completed)   UA/M w/rflx Culture, Routine (Completed)   Rib pain on right side       Obtain rib x-ray. Prednisone, warm compresses, stretches. F/u if not improving   Relevant Orders   DG Ribs Unilateral Right (Completed)   Flu vaccine need       Relevant Orders   Flu Vaccine QUAD 36+ mos IM (Completed)   Screening for prostate cancer       Relevant Orders   PSA (Completed)       Discussed aspirin prophylaxis for myocardial infarction prevention and decision was it was not  indicated  LABORATORY TESTING:  Health maintenance labs ordered today as discussed above.   The natural history of prostate cancer and ongoing controversy regarding screening and potential treatment outcomes of prostate cancer has been discussed with the patient. The meaning of a false positive PSA and a false negative PSA has been discussed. He indicates understanding of the limitations of this screening test and wishes to proceed with screening PSA testing.   IMMUNIZATIONS:   - Tdap: Tetanus vaccination status reviewed: last tetanus booster within 10 years. - Influenza: Up to date  PATIENT COUNSELING:    Sexuality: Discussed sexually transmitted diseases, partner selection, use of condoms, avoidance of unintended pregnancy  and contraceptive alternatives.   Advised to avoid cigarette smoking.  I discussed with the patient that most people either abstain from alcohol or drink within safe limits (<=14/week and <=4 drinks/occasion for males, <=7/weeks and <= 3 drinks/occasion for females) and that the risk for alcohol disorders and other health effects rises proportionally with the number of drinks per week and how often a drinker exceeds daily limits.  Discussed cessation/primary prevention of drug use and availability of treatment for abuse.   Diet: Encouraged to adjust caloric intake to maintain  or achieve ideal body weight, to reduce intake of dietary saturated fat and total fat, to limit sodium intake by avoiding high sodium foods and not adding table salt, and to maintain adequate dietary potassium and calcium preferably from fresh fruits, vegetables, and low-fat dairy products.    stressed the importance of regular exercise  Injury prevention: Discussed safety belts, safety helmets, smoke detector, smoking near bedding or upholstery.   Dental health: Discussed importance of regular tooth brushing, flossing, and dental visits.   Follow up plan: NEXT PREVENTATIVE PHYSICAL DUE IN 1  YEAR. Return in about 4 weeks (around 02/11/2020) for Sleep, migraine, allergy f/u.

## 2020-01-14 NOTE — Patient Instructions (Signed)
[facility]  

## 2020-01-15 ENCOUNTER — Ambulatory Visit
Admission: RE | Admit: 2020-01-15 | Discharge: 2020-01-15 | Disposition: A | Discharge status: Home / Self Care | Payer: Managed Care, Other (non HMO) | Source: Ambulatory Visit | Attending: Family Medicine | Admitting: Family Medicine

## 2020-01-15 ENCOUNTER — Ambulatory Visit: Payer: Managed Care, Other (non HMO) | Admitting: Podiatry

## 2020-01-15 ENCOUNTER — Ambulatory Visit
Admission: RE | Admit: 2020-01-15 | Discharge: 2020-01-15 | Disposition: A | Discharge status: Home / Self Care | Payer: Managed Care, Other (non HMO) | Attending: Family Medicine | Admitting: Family Medicine

## 2020-01-15 DIAGNOSIS — R0781 Pleurodynia: Secondary | ICD-10-CM | POA: Insufficient documentation

## 2020-01-15 LAB — CBC WITH DIFFERENTIAL/PLATELET
Basophils Absolute: 0 10*3/uL (ref 0.0–0.2)
Basos: 1 %
EOS (ABSOLUTE): 0.1 10*3/uL (ref 0.0–0.4)
Eos: 2 %
Hematocrit: 48.6 % (ref 37.5–51.0)
Hemoglobin: 16.6 g/dL (ref 13.0–17.7)
Immature Grans (Abs): 0 10*3/uL (ref 0.0–0.1)
Immature Granulocytes: 0 %
Lymphocytes Absolute: 2.6 10*3/uL (ref 0.7–3.1)
Lymphs: 32 %
MCH: 30.3 pg (ref 26.6–33.0)
MCHC: 34.2 g/dL (ref 31.5–35.7)
MCV: 89 fL (ref 79–97)
Monocytes Absolute: 0.6 10*3/uL (ref 0.1–0.9)
Monocytes: 8 %
Neutrophils Absolute: 4.9 10*3/uL (ref 1.4–7.0)
Neutrophils: 57 %
Platelets: 274 10*3/uL (ref 150–450)
RBC: 5.47 x10E6/uL (ref 4.14–5.80)
RDW: 12.6 % (ref 11.6–15.4)
WBC: 8.3 10*3/uL (ref 3.4–10.8)

## 2020-01-15 LAB — LIPID PANEL W/O CHOL/HDL RATIO
Cholesterol, Total: 248 mg/dL — ABNORMAL HIGH (ref 100–199)
HDL: 58 mg/dL (ref >39)
LDL Chol Calc (NIH): 164 mg/dL — ABNORMAL HIGH (ref 0–99)
Triglycerides: 143 mg/dL (ref 0–149)
VLDL Cholesterol Cal: 26 mg/dL (ref 5–40)

## 2020-01-15 LAB — PSA: Prostate Specific Ag, Serum: 0.5 ng/mL (ref 0.0–4.0)

## 2020-01-15 LAB — COMPREHENSIVE METABOLIC PANEL
ALT: 72 IU/L — ABNORMAL HIGH (ref 0–44)
AST: 31 IU/L (ref 0–40)
Albumin/Globulin Ratio: 1.9 (ref 1.2–2.2)
Albumin: 4.6 g/dL (ref 4.0–5.0)
Alkaline Phosphatase: 53 IU/L (ref 39–117)
BUN/Creatinine Ratio: 20 (ref 9–20)
BUN: 19 mg/dL (ref 6–24)
Bilirubin Total: 0.5 mg/dL (ref 0.0–1.2)
CO2: 27 mmol/L (ref 20–29)
Calcium: 9.8 mg/dL (ref 8.7–10.2)
Chloride: 100 mmol/L (ref 96–106)
Creatinine, Ser: 0.94 mg/dL (ref 0.76–1.27)
GFR calc Af Amer: 114 mL/min/{1.73_m2} (ref >59)
GFR calc non Af Amer: 99 mL/min/{1.73_m2} (ref >59)
Globulin, Total: 2.4 g/dL (ref 1.5–4.5)
Glucose: 81 mg/dL (ref 65–99)
Potassium: 4 mmol/L (ref 3.5–5.2)
Sodium: 140 mmol/L (ref 134–144)
Total Protein: 7 g/dL (ref 6.0–8.5)

## 2020-01-15 LAB — TSH: TSH: 1.25 u[IU]/mL (ref 0.450–4.500)

## 2020-01-24 ENCOUNTER — Encounter: Payer: Self-pay | Admitting: Family Medicine

## 2020-01-25 ENCOUNTER — Other Ambulatory Visit: Payer: Self-pay | Admitting: Family Medicine

## 2020-01-25 MED ORDER — AMOXICILLIN-POT CLAVULANATE 875-125 MG PO TABS: 1.0000 | ORAL_TABLET | Freq: Two times a day (BID) | ORAL | 0 refills | Status: DC

## 2020-01-30 ENCOUNTER — Other Ambulatory Visit: Payer: Managed Care, Other (non HMO) | Admitting: Orthotics

## 2020-01-30 ENCOUNTER — Other Ambulatory Visit: Payer: Self-pay | Admitting: Family Medicine

## 2020-01-30 MED ORDER — TOPIRAMATE 25 MG PO TABS: 25.0000 mg | ORAL_TABLET | Freq: Two times a day (BID) | ORAL | 0 refills | Status: DC

## 2020-02-03 NOTE — Assessment & Plan Note (Signed)
Add singulair to existing allergy regimen, prednisone sent for current flare. Sinus rinses, mucinex prn

## 2020-02-03 NOTE — Assessment & Plan Note (Signed)
Restart elavil, work on sleep hygiene. F/u in 1 month

## 2020-02-03 NOTE — Assessment & Plan Note (Signed)
Restart elavil, continue prn imitrex. F/u in 1 month

## 2020-02-15 ENCOUNTER — Ambulatory Visit: Payer: Managed Care, Other (non HMO) | Admitting: Family Medicine

## 2020-02-27 ENCOUNTER — Ambulatory Visit: Payer: Managed Care, Other (non HMO) | Admitting: Orthotics

## 2020-02-27 ENCOUNTER — Other Ambulatory Visit: Payer: Self-pay

## 2020-02-27 ENCOUNTER — Other Ambulatory Visit: Payer: Managed Care, Other (non HMO) | Admitting: Orthotics

## 2020-02-27 DIAGNOSIS — M7671 Peroneal tendinitis, right leg: Secondary | ICD-10-CM

## 2020-02-27 DIAGNOSIS — M722 Plantar fascial fibromatosis: Secondary | ICD-10-CM

## 2020-02-27 NOTE — Progress Notes (Signed)
Patient came in today to pick up custom made foot orthotics.  The goals were accomplished and the patient reported no dissatisfaction with said orthotics.  Patient was advised of breakin period and how to report any issues. 

## 2020-03-06 ENCOUNTER — Other Ambulatory Visit: Payer: Self-pay | Admitting: Family Medicine

## 2020-03-06 NOTE — Telephone Encounter (Signed)
Requested medication (s) are due for refill today: yes  Requested medication (s) are on the active medication list: yes  Last refill:  11/13/18 #180 with 4 refills  Future visit scheduled: no  Notes to clinic:  Medication previously ordered by Dr. Dossie Arbour. Medication not addressed in recent visits    Requested Prescriptions  Pending Prescriptions Disp Refills   omeprazole (PRILOSEC) 20 MG capsule [Pharmacy Med Name: OMEPRAZOLE DR 20 MG CAPSULE] 60 capsule 0    Sig: Take 1 capsule (20 mg total) by mouth 2 (two) times daily before a meal.      Gastroenterology: Proton Pump Inhibitors Passed - 03/06/2020  6:48 PM      Passed - Valid encounter within last 12 months    Recent Outpatient Visits           1 month ago Migraine without status migrainosus, not intractable, unspecified migraine type   [facility] [place], [place]   4 months ago Suspected COVID-19 virus infection   [facility] [place], [place], [place]   1 year ago Non-seasonal allergic rhinitis due to other allergic trigger   [facility] [place], [place]   1 year ago Acute maxillary sinusitis, recurrence not specified   [facility] [place], [place]   1 year ago PE (physical exam), annual   [facility] [place], [place]ge Gainer, [place]

## 2020-03-07 NOTE — Telephone Encounter (Signed)
Patient last seen 01/14/20 for CPE

## 2020-04-02 ENCOUNTER — Encounter: Payer: Self-pay | Admitting: Podiatry

## 2020-04-02 ENCOUNTER — Other Ambulatory Visit: Payer: Self-pay

## 2020-04-02 ENCOUNTER — Ambulatory Visit (INDEPENDENT_AMBULATORY_CARE_PROVIDER_SITE_OTHER): Payer: Managed Care, Other (non HMO) | Admitting: Podiatry

## 2020-04-02 DIAGNOSIS — M722 Plantar fascial fibromatosis: Secondary | ICD-10-CM | POA: Diagnosis not present

## 2020-04-02 DIAGNOSIS — M7671 Peroneal tendinitis, right leg: Secondary | ICD-10-CM

## 2020-04-02 NOTE — Progress Notes (Signed)
He presents today before he goes on vacation to see Raiford Noble for orthotic adjustment.  He would like to see me today as well for a shot in his bilateral heels.  Objective: Vital signs are stable alert oriented x3 he has pain to palpation medial calcaneal tubercles bilateral.  Assessment plantar fasciitis bilateral.  Plan: Went ahead and reinjected his bilateral heels today 20 mg Kenalog per milligrams Marcaine point maximal tenderness.  He tolerated procedure well will follow up with Raiford Noble for orthotic pickup

## 2020-04-02 NOTE — Progress Notes (Signed)
Adjsut f/o to skive distal shell, heel punch b/l and h/s cushion.  Change topcover to spenco 55mm  And 1/16 Ppt Padding.Marland Kitchen

## 2020-05-09 IMAGING — DX DG RIBS 2V*R*
5 series · 5 of 5 positions shown · non-contrast
Comparison: None.

CLINICAL DATA: Eight months of right rib pain. Increased pain after
COVID diagnosis in [DATE] months ago).

EXAM:
RIGHT RIBS - 2 VIEW

[rib ap (1 of 2)]
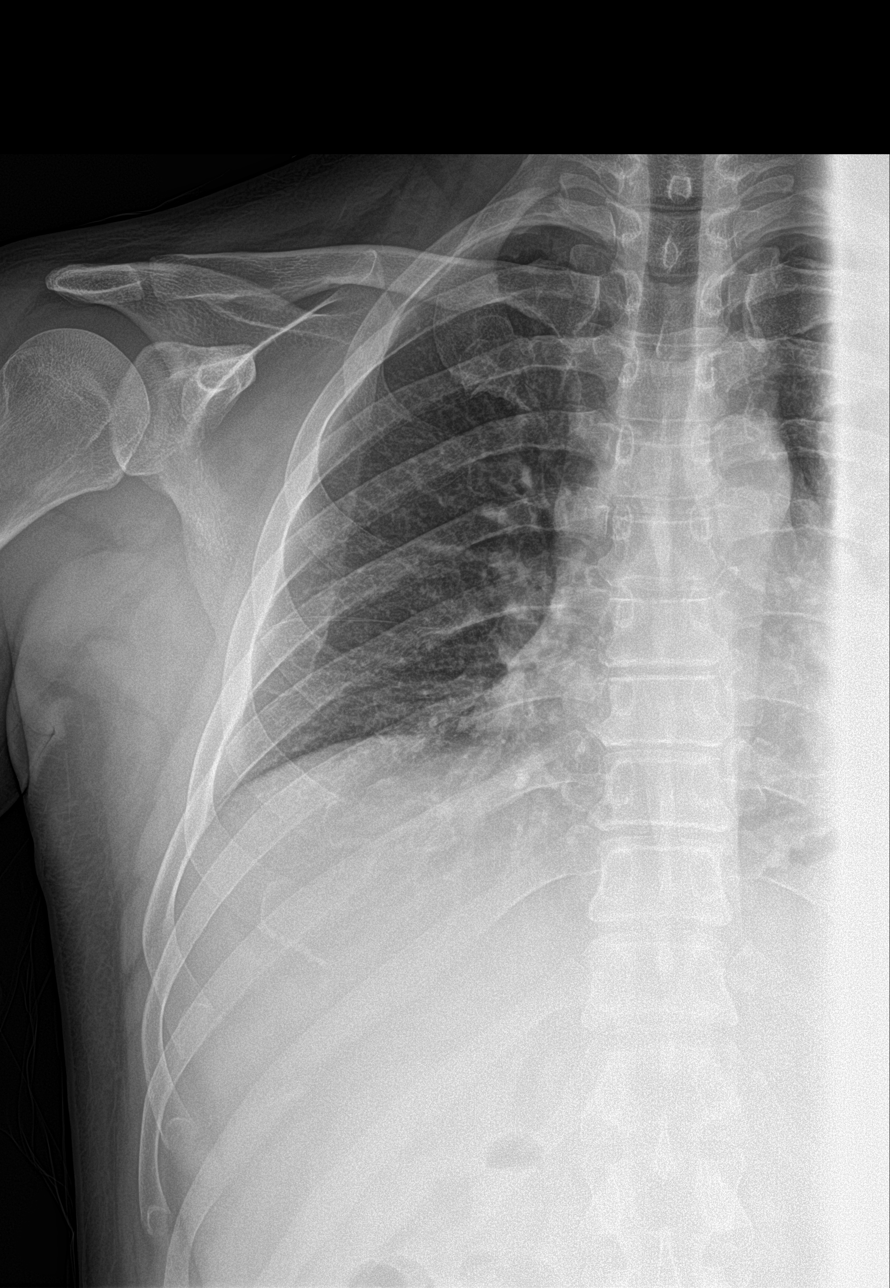

[rib ap (2 of 2)]
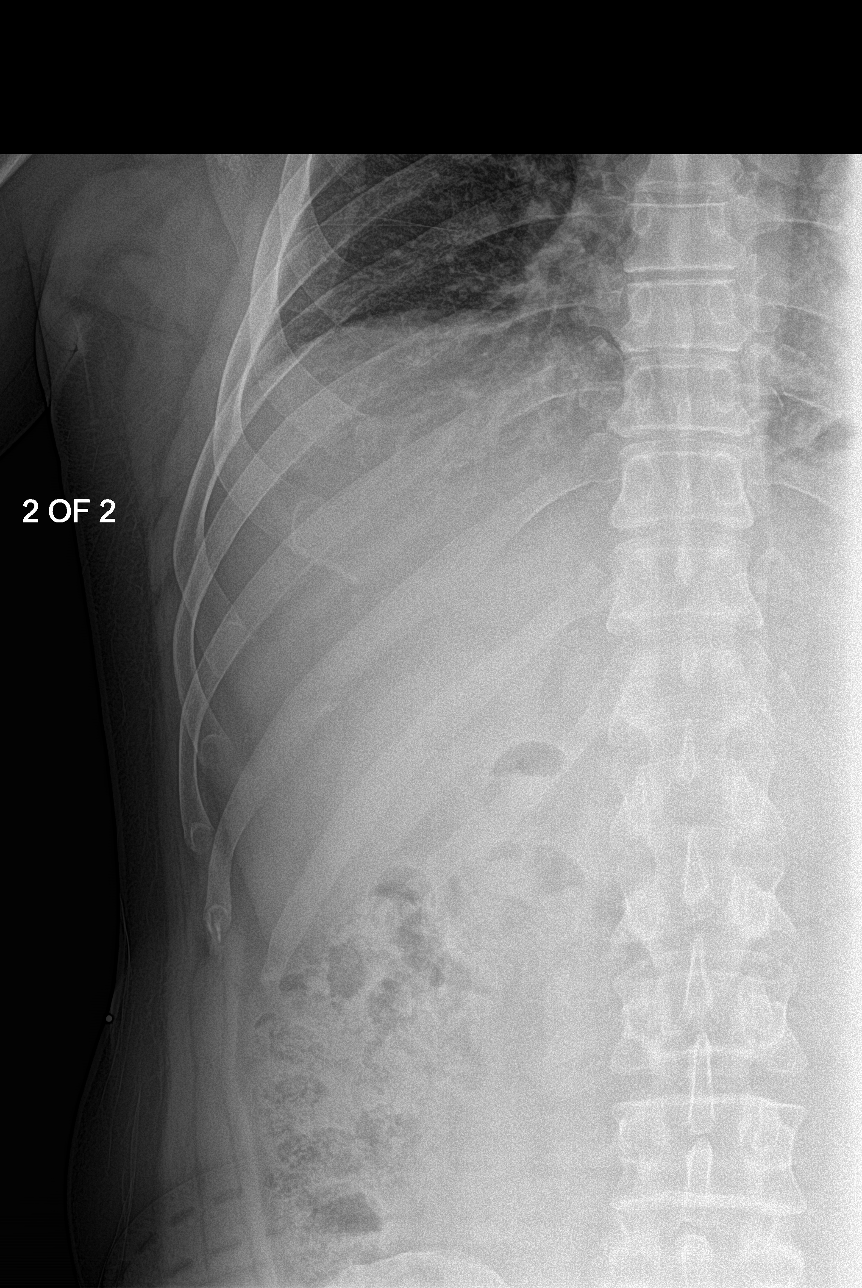

[rib obl (1 of 3)]
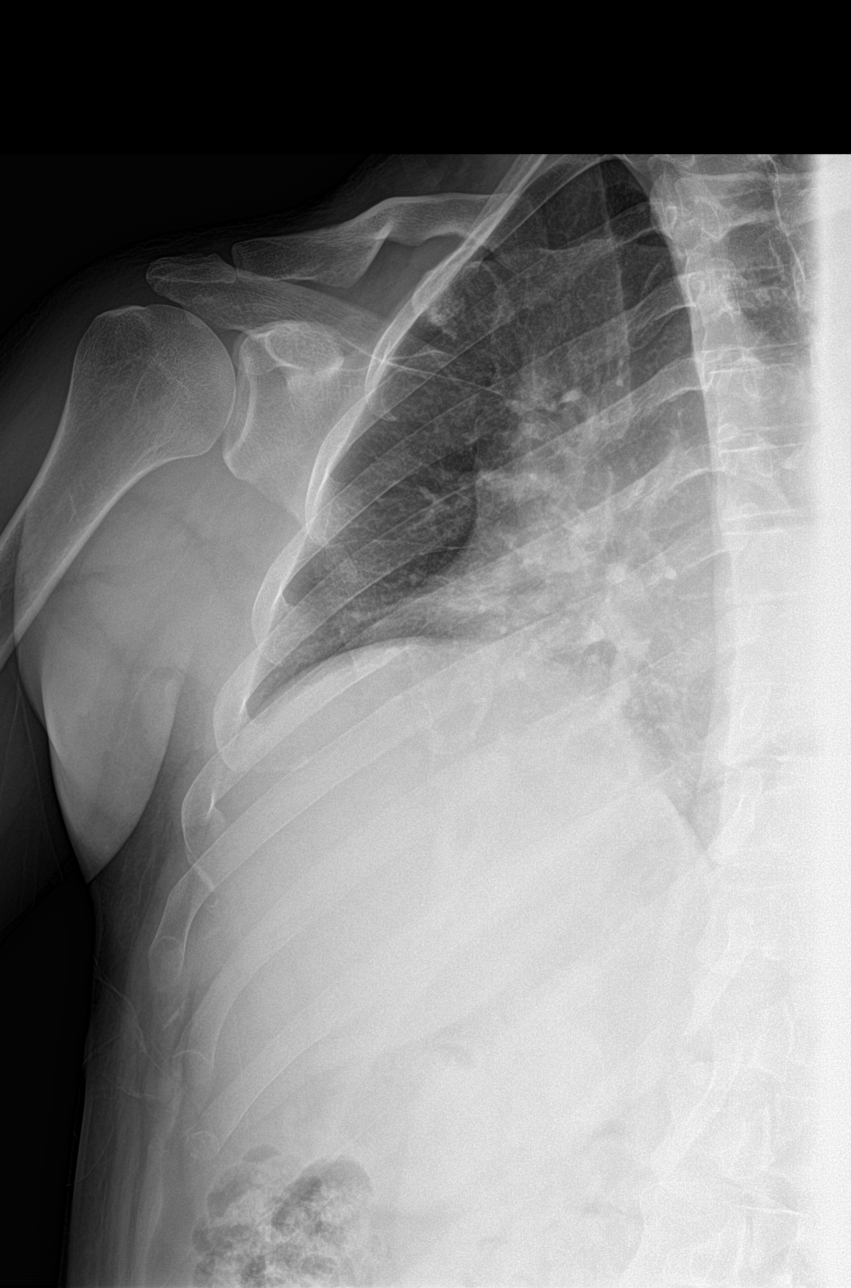

[rib obl (2 of 3)]
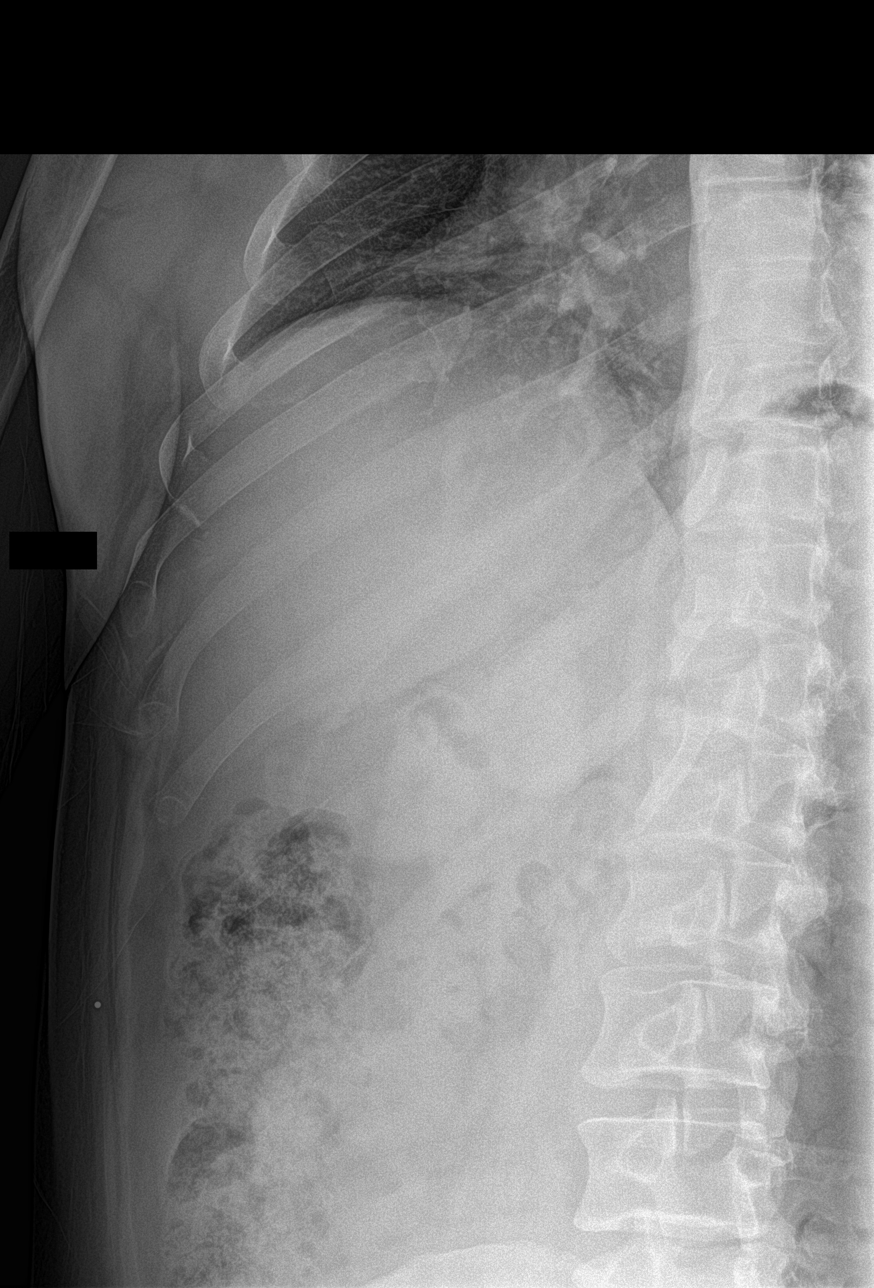

[rib obl (3 of 3)]
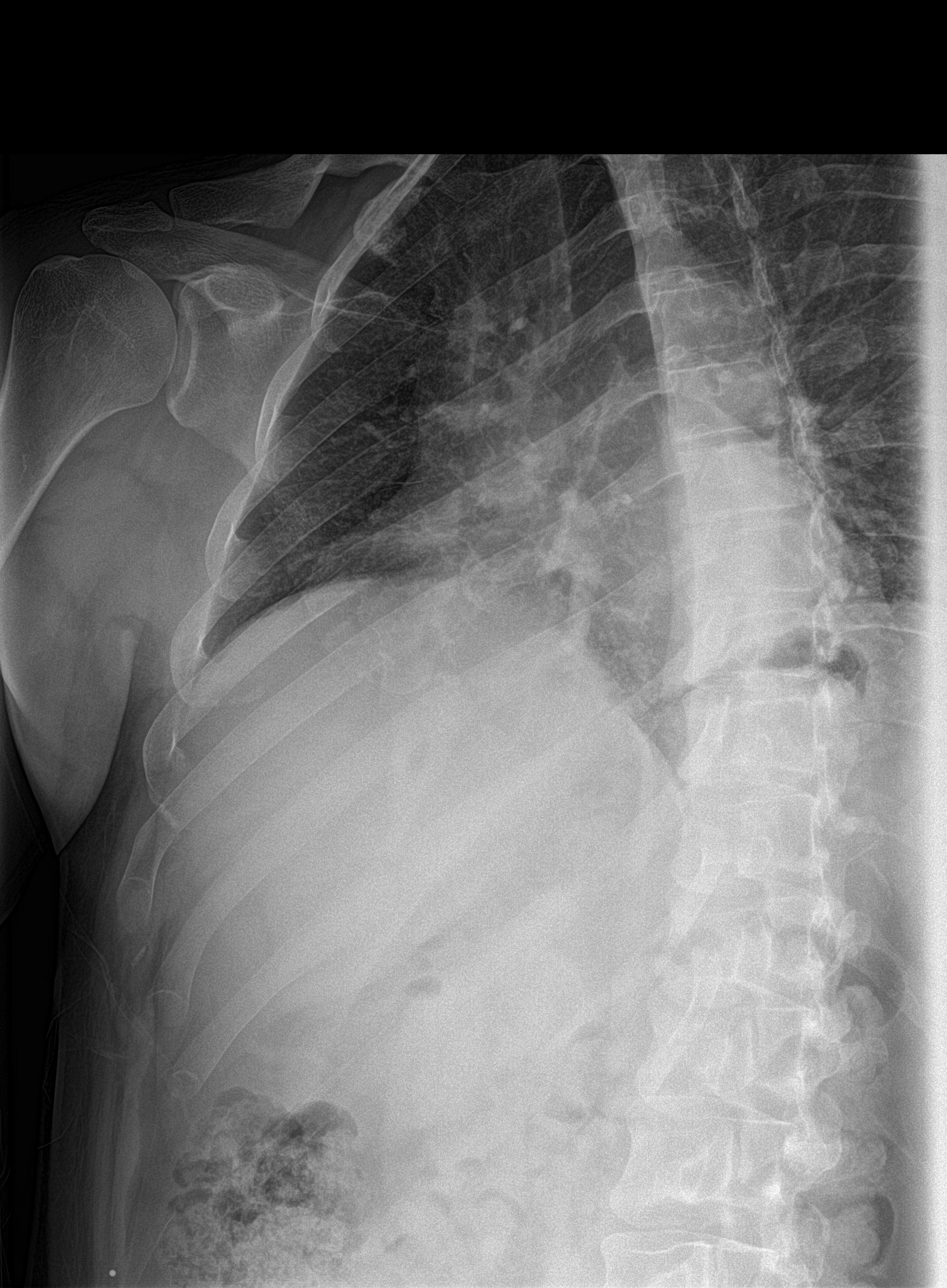

[5 of 5 positions shown; findings below may reference images not displayed]

FINDINGS: No fracture or other bone lesions are seen involving the ribs.
Included right lung is clear.
IMPRESSION: Negative radiographs of the right ribs.

## 2020-05-26 ENCOUNTER — Telehealth: Payer: Self-pay | Admitting: Podiatry

## 2020-05-26 NOTE — Telephone Encounter (Signed)
Pt called stating that he was told that Dr.Evans had his orthotics please assist

## 2020-05-26 NOTE — Telephone Encounter (Signed)
Pt called asking about his orthotics. He states no one seems to know where they are.  I discussed with Dr Logan Bores and he states he put them in the bin in the orthotic room in the correct box(where he was told to put them)   I called pt and made him aware but he would like the [place] office to call when they locate them. I have sent a message to [place] to look for them and call him to let him know they have them.

## 2020-07-02 NOTE — Telephone Encounter (Signed)
Pt called back and is scheduled to see Raiford Noble in [place] on 8.18.2021

## 2020-07-11 ENCOUNTER — Other Ambulatory Visit: Payer: Self-pay

## 2020-07-11 ENCOUNTER — Other Ambulatory Visit: Payer: Self-pay | Admitting: Family Medicine

## 2020-07-11 ENCOUNTER — Ambulatory Visit: Payer: Managed Care, Other (non HMO) | Admitting: Podiatry

## 2020-07-11 ENCOUNTER — Encounter: Payer: Self-pay | Admitting: Podiatry

## 2020-07-11 DIAGNOSIS — L600 Ingrowing nail: Secondary | ICD-10-CM | POA: Diagnosis not present

## 2020-07-11 DIAGNOSIS — M722 Plantar fascial fibromatosis: Secondary | ICD-10-CM

## 2020-07-11 DIAGNOSIS — B353 Tinea pedis: Secondary | ICD-10-CM

## 2020-07-11 MED ORDER — CLOTRIMAZOLE-BETAMETHASONE 1-0.05 % EX CREA: 1.0000 "application " | TOPICAL_CREAM | Freq: Two times a day (BID) | CUTANEOUS | 1 refills | Status: DC

## 2020-07-11 NOTE — Progress Notes (Signed)
Subjective: Patient presents today with multiple complaints associated to his right foot.  Today he presents for evaluation of pain to the medial border of the right fourth toe. Patient is concerned for possible ingrown nail.  Patient states that he hit his toe and split his nail approximately 3 weeks ago.  Is been tender presents with bleeding.  He has been applying Neosporin daily to the area.  Minimal relief. He also states that he continues to have some residual heel pain to the plantar fascia of the right foot.  He states that his left foot is doing very well.  He tried the custom molded orthotics which have not helped alleviate any of his symptoms and he says that they hurt and actually caused the pain to be worse.  He presents for further treatment and evaluation Finally the patient also complains of cracking skin with itching and burning to the interdigital spaces of the third and fourth interdigital webspace bilaterally.  This is been off and on for several months.  He has been applying Neosporin to this area with minimal relief.  Past Medical History:  Diagnosis Date   Bipolar 1 disorder (HCC)    Calculus of kidney    Depression    Fatty liver    Plantar fasciitis     Objective:  General: Well developed, nourished, in no acute distress, alert and oriented x3   Dermatology: Skin is warm, dry and supple bilateral.  Medial border of the right fourth toe appears to be erythematous with evidence of an ingrowing nail. Pain on palpation noted to the border of the nail fold. The remaining nails appear unremarkable at this time. There are no open sores, lesions. There is some interdigital maceration with peeling skin and superficial fissure of the skin noted at the base of the interdigital webspaces to the bilateral feet.  Associated pruritus.  Vascular: Dorsalis Pedis artery and Posterior Tibial artery pedal pulses palpable. No lower extremity edema noted.   Neruologic: Grossly  intact via light touch bilateral.  Musculoskeletal: Muscular strength within normal limits in all groups bilateral. Normal range of motion noted to all pedal and ankle joints.  There continues to be some pain on palpation to the medial calcaneal tubercle right heel  Assesement: #1 Paronychia with ingrowing nail right fourth toe medial border #2 tinea pedis bilateral 3.  Plantar fascitis right  Plan of Care:  1. Patient evaluated.  2. Discussed treatment alternatives and plan of care. Explained nail avulsion procedure and post procedure course to patient. 3. Patient opted for permanent partial nail avulsion of the medial border of the right fourth toe.  4. Prior to procedure, local anesthesia infiltration utilized using 3 ml of a 50:50 mixture of 2% plain lidocaine and 0.5% plain marcaine in a normal hallux block fashion and a betadine prep performed.  5. Partial permanent nail avulsion with chemical matrixectomy performed using 3x30sec applications of phenol followed by alcohol flush.  6. Light dressing applied.  Recommend antibiotic ointment daily to the area 7.  Injection of 0.5 cc Celestone Soluspan injected into the plantar fascia right medial heel  8.  Prescription for Lotrisone cream apply 2 times daily to the interdigital webspaces  9.  Patient has an appointment with the Pedorthist on 07/16/2020 for reevaluation of the custom orthotics  10.  Return to clinic 6 weeks.  Felecia Shelling, DPM [facility]  Dr. Felecia Shelling, DPM    [address]  [place], [place] [address]                Office [phone]  Fax [phone]

## 2020-07-11 NOTE — Telephone Encounter (Signed)
Requested medication (s) are due for refill today: yes  Requested medication (s) are on the active medication list: yes  Last refill:  1216/19 #90 with 4 refills  Future visit scheduled: no  Notes to clinic:  Please review for refill. Last filled by Dr. Dossie Arbour.    Requested Prescriptions  Pending Prescriptions Disp Refills   meloxicam (MOBIC) 15 MG tablet [Pharmacy Med Name: MELOXICAM 15 MG TABLET] 30 tablet 0    Sig: Take 1 tablet (15 mg total) by mouth daily.      Analgesics:  COX2 Inhibitors Passed - 07/11/2020  4:40 PM      Passed - HGB in normal range and within 360 days    Hemoglobin  Date Value Ref Range Status  01/14/2020 16.6 13.0 - 17.7 g/dL Final          Passed - Cr in normal range and within 360 days    Creatinine  Date Value Ref Range Status  10/14/2012 1.11 0.60 - 1.30 mg/dL Final   Creatinine, Ser  Date Value Ref Range Status  01/14/2020 0.94 0.76 - 1.27 mg/dL Final          Passed - Patient is not pregnant      Passed - Valid encounter within last 12 months    Recent Outpatient Visits           5 months ago Migraine without status migrainosus, not intractable, unspecified migraine type   [facility] Particia N[place], [place]   8 months ago Suspected COVID-19 virus infection   [facility] [place], [place], [place]   1 year ago Non-seasonal allergic rhinitis due to other allergic trigger   [facility] Particia Nearing, [place]   1 year ago Acute maxillary sinusitis, recurrence not specified   [facility] Particia N[place], [place]   1 year ago PE (physical exam), annual   [facility] [place], Redge Gainer, [place]

## 2020-07-16 ENCOUNTER — Other Ambulatory Visit: Payer: Self-pay

## 2020-07-16 ENCOUNTER — Other Ambulatory Visit: Payer: Managed Care, Other (non HMO) | Admitting: Orthotics

## 2020-10-21 ENCOUNTER — Other Ambulatory Visit: Payer: Self-pay

## 2020-10-21 ENCOUNTER — Ambulatory Visit: Payer: Managed Care, Other (non HMO) | Admitting: Podiatry

## 2020-10-21 DIAGNOSIS — M722 Plantar fascial fibromatosis: Secondary | ICD-10-CM | POA: Diagnosis not present

## 2020-10-21 MED ORDER — METHYLPREDNISOLONE 4 MG PO TBPK: ORAL_TABLET | ORAL | 0 refills | Status: DC

## 2020-10-21 NOTE — Progress Notes (Signed)
   Subjective: 44 y.o. male presenting to the office today for flareup of plantar fasciitis to the right heel.  Patient states he was doing very well.  He has been wearing the custom molded orthotics as instructed.  He states that he has had an acute flareup over the last few weeks of his right heel.  His left heel is doing very well today.  He presents for further treatment and evaluation   Past Medical History:  Diagnosis Date  . Bipolar 1 disorder (HCC)   . Calculus of kidney   . Depression   . Fatty liver   . Plantar fasciitis      Objective: Physical Exam General: The patient is alert and oriented x3 in no acute distress.  Dermatology: Skin is warm, dry and supple bilateral lower extremities. Negative for open lesions or macerations bilateral.   Vascular: Dorsalis Pedis and Posterior Tibial pulses palpable bilateral.  Capillary fill time is immediate to all digits.  Neurological: Epicritic and protective threshold intact bilateral.   Musculoskeletal: Tenderness to palpation to the plantar aspect of the right heel along the plantar fascia. All other joints range of motion within normal limits bilateral. Strength 5/5 in all groups bilateral.   Assessment: 1. Plantar fasciitis right  Plan of Care:  1. Patient evaluated. Xrays reviewed.   2. Injection of 0.5cc Celestone soluspan injected into the right plantar fascia  3. Rx for Medrol Dose Pack placed 4.  Continue custom molded orthotics, or OTC power step insoles, whichever is more comfortable 5.  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   Felecia Shelling, DPM [facility]  Dr. Felecia Shelling, DPM    [address] [place], [place] [address]                Office [phone]  Fax [phone]

## 2020-11-04 ENCOUNTER — Telehealth: Payer: Self-pay | Admitting: *Deleted

## 2020-11-04 NOTE — Telephone Encounter (Signed)
"  A message on my MyChart said to call I guess about the soles for my foot.  I'm trying to touch back with you on the message."

## 2020-11-28 ENCOUNTER — Other Ambulatory Visit: Payer: Self-pay | Admitting: Family Medicine

## 2020-11-30 NOTE — Telephone Encounter (Signed)
Needs a co-signer. Routing to office.

## 2020-12-01 NOTE — Telephone Encounter (Signed)
Overdue for appt

## 2020-12-01 NOTE — Telephone Encounter (Signed)
Lvm to make this apt. 

## 2020-12-03 NOTE — Telephone Encounter (Signed)
Pt just needs to [place] apt if he calls back call was dropped.

## 2020-12-06 ENCOUNTER — Encounter: Payer: Self-pay | Admitting: Nurse Practitioner

## 2020-12-06 DIAGNOSIS — E7800 Pure hypercholesterolemia, unspecified: Secondary | ICD-10-CM | POA: Insufficient documentation

## 2020-12-09 ENCOUNTER — Encounter: Payer: Self-pay | Admitting: Podiatry

## 2020-12-09 ENCOUNTER — Other Ambulatory Visit: Payer: Self-pay

## 2020-12-09 ENCOUNTER — Ambulatory Visit: Payer: Managed Care, Other (non HMO) | Admitting: Nurse Practitioner

## 2020-12-09 ENCOUNTER — Encounter: Payer: Self-pay | Admitting: Nurse Practitioner

## 2020-12-09 VITALS — BP 104/68 | HR 79 | Temp 98.3°F | Ht 69.13 in | Wt 203.2 lb

## 2020-12-09 DIAGNOSIS — M545 Low back pain, unspecified: Secondary | ICD-10-CM

## 2020-12-09 DIAGNOSIS — G8929 Other chronic pain: Secondary | ICD-10-CM | POA: Insufficient documentation

## 2020-12-09 DIAGNOSIS — E7800 Pure hypercholesterolemia, unspecified: Secondary | ICD-10-CM

## 2020-12-09 DIAGNOSIS — F3342 Major depressive disorder, recurrent, in full remission: Secondary | ICD-10-CM | POA: Diagnosis not present

## 2020-12-09 DIAGNOSIS — Z125 Encounter for screening for malignant neoplasm of prostate: Secondary | ICD-10-CM

## 2020-12-09 DIAGNOSIS — Z79899 Other long term (current) drug therapy: Secondary | ICD-10-CM | POA: Insufficient documentation

## 2020-12-09 DIAGNOSIS — G43009 Migraine without aura, not intractable, without status migrainosus: Secondary | ICD-10-CM | POA: Diagnosis not present

## 2020-12-09 DIAGNOSIS — K219 Gastro-esophageal reflux disease without esophagitis: Secondary | ICD-10-CM

## 2020-12-09 DIAGNOSIS — Z1329 Encounter for screening for other suspected endocrine disorder: Secondary | ICD-10-CM

## 2020-12-09 MED ORDER — TRAMADOL HCL 50 MG PO TABS: ORAL_TABLET | ORAL | 0 refills | Status: DC

## 2020-12-09 MED ORDER — OMEPRAZOLE 20 MG PO CPDR: 20.0000 mg | DELAYED_RELEASE_CAPSULE | Freq: Two times a day (BID) | ORAL | 4 refills | Status: DC

## 2020-12-09 MED ORDER — MELOXICAM 15 MG PO TABS: 15.0000 mg | ORAL_TABLET | Freq: Every day | ORAL | 4 refills | Status: DC

## 2020-12-09 MED ORDER — CLONAZEPAM 1 MG PO TABS: 1.0000 mg | ORAL_TABLET | Freq: Two times a day (BID) | ORAL | 1 refills | Status: DC | PRN

## 2020-12-09 MED ORDER — SUMATRIPTAN SUCCINATE 100 MG PO TABS: 100.0000 mg | ORAL_TABLET | ORAL | 11 refills | Status: DC | PRN

## 2020-12-09 NOTE — Assessment & Plan Note (Signed)
Chronic, stable.  Minimally reports using Tramadol and has not refilled since 2019.  At this time will provide 10 pills on a refill and recommend he minimally use these, if worsening back pain or increased need for pain medication would benefit from referral to ortho, PT, or pain management.  He is not to take Tramadol at same time as Klonopin, is aware of this.  UDS today.

## 2020-12-09 NOTE — Assessment & Plan Note (Signed)
Monitor use closely, minimally uses.  Refer to depression plan.  UDS today.

## 2020-12-09 NOTE — Assessment & Plan Note (Signed)
No current medication, recheck lipid panel today.

## 2020-12-09 NOTE — Progress Notes (Signed)
BP 104/68   Pulse 79   Temp 98.3 F (36.8 C) (Oral)   Ht 5' 9.13" (1.756 m)   Wt 203 lb 3.2 oz (92.2 kg)   SpO2 97%   BMI 29.89 kg/m    Subjective:    Patient ID: MANTAJ CHAMBERLIN, male    DOB: February 09, 1976, 45 y.o.   MRN: [id number]  HPI: LOVELACE CERVENY is a 45 y.o. male  Chief Complaint  Patient presents with  . Medication Refill    Migraine and mood   DEPRESSION Taking Klonopin -- last refill on PDMP review 12/28/2018.  Pt is  aware of risks of benzo medication use to include increased sedation, respiratory suppression, falls, dependence and cardiovascular events.  Pt would like to continue treatment as benefit determined to outweigh risk.  Minimally uses Klonopin based on review of refills.  Uses for work stressors on occasion.  Previously saw a provider for back pain, Dr. Ethelene Hal he reports.  Would like refill on Tramadol which he took in past from PCP.  Last fill was in 2019 by PA Bowdens and he minimally uses.  Discussed with him that will refill today, but is not to take with Klonopin and to use only minimally. Mood status: stable Satisfied with current treatment?: yes Symptom severity: mild  Duration of current treatment : chronic Side effects: no Medication compliance: good compliance Psychotherapy/counseling: none Previous psychiatric medications: Topamax, Klonopin, SSRI Depressed mood: no Anxious mood: no Anhedonia: no Significant weight loss or gain: no Insomnia: yes hard to fall asleep Fatigue: yes Feelings of worthlessness or guilt: no Impaired concentration/indecisiveness: no Suicidal ideations: no Hopelessness: no Crying spells: no Depression screen [facility] 2/9 12/09/2020 01/14/2020 02/27/2019 11/13/2018 07/25/2018  Decreased Interest 0 1 3 0 0  Down, Depressed, Hopeless 0 1 0 0 1  PHQ - 2 Score 0 2 3 0 1  Altered sleeping 3 1 2 1 1   Tired, decreased energy 3 2 3 1 3   Change in appetite 0 0 0 0 1  Feeling bad or failure about yourself  0 0 0 0 0  Trouble  concentrating 0 0 0 0 3  Moving slowly or fidgety/restless 0 0 0 0 0  Suicidal thoughts 0 0 0 0 0  PHQ-9 Score 6 5 8 2 9    MIGRAINES Is seeing ENT who feels he has chronic allergy issues and is having testing for this + chronic sinus infections.  Causes some increased headaches.  Takes Imitrex as needed. Duration: chronic Frequency: once every few weeks Location: frontal and behind eyes Headache duration: 1/2 a day Radiation: no Time of day headache occurs: varies Alleviating factors: Imitrex Aggravating factors: bright light Headache status at time of visit: asymptomatic Treatments attempted: Treatments attempted: APAP, ibuprofen and triptans   Aura: no Nausea:  yes Vomiting: no Photophobia:  yes Phonophobia:  yes Effect on social functioning:  no Numbers of missed days of school/work each month: 0 Confusion:  no Gait disturbance/ataxia:  no Behavioral changes:  no Fevers:  no  Relevant past medical, surgical, family and social history reviewed and updated as indicated. Interim medical history since our last visit reviewed. Allergies and medications reviewed and updated.  Review of Systems  Constitutional: Negative for activity change, diaphoresis, fatigue and fever.  Respiratory: Negative for cough, chest tightness, shortness of breath and wheezing.   Cardiovascular: Negative for chest pain, palpitations and leg swelling.  Gastrointestinal: Negative.   Musculoskeletal: Positive for back pain.  Skin: Negative.   Neurological: Negative.  Psychiatric/Behavioral: Negative.     Per HPI unless specifically indicated above     Objective:    BP 104/68   Pulse 79   Temp 98.3 F (36.8 C) (Oral)   Ht 5' 9.13" (1.756 m)   Wt 203 lb 3.2 oz (92.2 kg)   SpO2 97%   BMI 29.89 kg/m   Wt Readings from Last 3 Encounters:  12/09/20 203 lb 3.2 oz (92.2 kg)  01/14/20 210 lb (95.3 kg)  11/01/19 204 lb (92.5 kg)    Physical Exam Vitals and nursing note reviewed.   Constitutional:      General: He is awake. He is not in acute distress.    Appearance: He is well-developed, well-groomed and overweight. He is not ill-appearing.  HENT:     Head: Normocephalic and atraumatic.     Right Ear: Hearing normal. No drainage.     Left Ear: Hearing normal. No drainage.  Eyes:     General: Lids are normal.        Right eye: No discharge.        Left eye: No discharge.     Conjunctiva/sclera: Conjunctivae normal.     Pupils: Pupils are equal, round, and reactive to light.  Neck:     Thyroid: No thyromegaly.     Vascular: No carotid bruit or JVD.     Trachea: Trachea normal.  Cardiovascular:     Rate and Rhythm: Normal rate and regular rhythm.     Heart sounds: Normal heart sounds, S1 normal and S2 normal. No murmur heard. No gallop.   Pulmonary:     Effort: Pulmonary effort is normal.     Breath sounds: Normal breath sounds.  Abdominal:     General: Bowel sounds are normal.     Palpations: Abdomen is soft.  Musculoskeletal:        General: Normal range of motion.     Cervical back: Normal range of motion and neck supple.     Right lower leg: No edema.     Left lower leg: No edema.  Skin:    General: Skin is warm and dry.     Capillary Refill: Capillary refill takes less than 2 seconds.  Neurological:     Mental Status: He is alert and oriented to person, place, and time.     Deep Tendon Reflexes: Reflexes are normal and symmetric.     Reflex Scores:      Brachioradialis reflexes are 2+ on the right side and 2+ on the left side.      Patellar reflexes are 2+ on the right side and 2+ on the left side. Psychiatric:        Attention and Perception: Attention normal.        Mood and Affect: Mood normal.        Speech: Speech normal.        Behavior: Behavior normal. Behavior is cooperative.        Thought Content: Thought content normal.     Results for orders placed or performed in visit on 01/14/20  CBC with Differential/Platelet  Result  Value Ref Range   WBC 8.3 3.4 - 10.8 x10E3/uL   RBC 5.47 4.14 - 5.80 x10E6/uL   Hemoglobin 16.6 13.0 - 17.7 g/dL   Hematocrit 16.148.6 09.637.5 - 51.0 %   MCV 89 79 - 97 fL   MCH 30.3 26.6 - 33.0 pg   MCHC 34.2 31.5 - 35.7 g/dL   RDW 04.512.6 40.911.6 - 81.115.4 %  Platelets 274 150 - 450 x10E3/uL   Neutrophils 57 Not Estab. %   Lymphs 32 Not Estab. %   Monocytes 8 Not Estab. %   Eos 2 Not Estab. %   Basos 1 Not Estab. %   Neutrophils Absolute 4.9 1.4 - 7.0 x10E3/uL   Lymphocytes Absolute 2.6 0.7 - 3.1 x10E3/uL   Monocytes Absolute 0.6 0.1 - 0.9 x10E3/uL   EOS (ABSOLUTE) 0.1 0.0 - 0.4 x10E3/uL   Basophils Absolute 0.0 0.0 - 0.2 x10E3/uL   Immature Granulocytes 0 Not Estab. %   Immature Grans (Abs) 0.0 0.0 - 0.1 x10E3/uL  Comprehensive metabolic panel  Result Value Ref Range   Glucose 81 65 - 99 mg/dL   BUN 19 6 - 24 mg/dL   Creatinine, Ser 6.96 0.76 - 1.27 mg/dL   GFR calc non Af Amer 99 >59 mL/min/1.73   GFR calc Af Amer 114 >59 mL/min/1.73   BUN/Creatinine Ratio 20 9 - 20   Sodium 140 134 - 144 mmol/L   Potassium 4.0 3.5 - 5.2 mmol/L   Chloride 100 96 - 106 mmol/L   CO2 27 20 - 29 mmol/L   Calcium 9.8 8.7 - 10.2 mg/dL   Total Protein 7.0 6.0 - 8.5 g/dL   Albumin 4.6 4.0 - 5.0 g/dL   Globulin, Total 2.4 1.5 - 4.5 g/dL   Albumin/Globulin Ratio 1.9 1.2 - 2.2   Bilirubin Total 0.5 0.0 - 1.2 mg/dL   Alkaline Phosphatase 53 39 - 117 IU/L   AST 31 0 - 40 IU/L   ALT 72 (H) 0 - 44 IU/L  Lipid Panel w/o Chol/HDL Ratio  Result Value Ref Range   Cholesterol, Total 248 (H) 100 - 199 mg/dL   Triglycerides 295 0 - 149 mg/dL   HDL 58 >28 mg/dL   VLDL Cholesterol Cal 26 5 - 40 mg/dL   LDL Chol Calc (NIH) 413 (H) 0 - 99 mg/dL  TSH  Result Value Ref Range   TSH 1.250 0.450 - 4.500 uIU/mL  UA/M w/rflx Culture, Routine   Specimen: Urine   URINE  Result Value Ref Range   Specific Gravity, UA 1.010 1.005 - 1.030   pH, UA 7.0 5.0 - 7.5   Color, UA Yellow Yellow   Appearance Ur Clear Clear    Leukocytes,UA Negative Negative   Protein,UA Negative Negative/Trace   Glucose, UA Negative Negative   Ketones, UA Negative Negative   RBC, UA Negative Negative   Bilirubin, UA Negative Negative   Urobilinogen, Ur 0.2 0.2 - 1.0 mg/dL   Nitrite, UA Negative Negative  PSA  Result Value Ref Range   Prostate Specific Ag, Serum 0.5 0.0 - 4.0 ng/mL      Assessment & Plan:   Problem List Items Addressed This Visit      Cardiovascular and Mediastinum   Migraine headache    Chronic, stable with minimal use of Imitrex.  Continue current medication regimen and adjust as needed.        Relevant Medications   SUMAtriptan (IMITREX) 100 MG tablet   meloxicam (MOBIC) 15 MG tablet   clonazePAM (KLONOPIN) 1 MG tablet   traMADol (ULTRAM) 50 MG tablet     Digestive   Gastroesophageal reflux disease    Chronic, stable with Prilosec.  Refills sent today.      Relevant Medications   omeprazole (PRILOSEC) 20 MG capsule   Other Relevant Orders   CBC with Differential/Platelet   Comprehensive metabolic panel     Other  Depression - Primary    Chronic, stable, denies SI/I.  Minimally uses Klonopin and refuses to trial alternate options.  Has been on this for some time started by previous PCP.  Recommend in future trial of SSRI or SNRI with reduction to discontinuation of use benzo.  Return in one year, sooner if worsening mood.  UDS today.      Elevated low density lipoprotein (LDL) cholesterol level    No current medication, recheck lipid panel today.      Relevant Orders   Lipid Panel w/o Chol/HDL Ratio   Chronic back pain    Chronic, stable.  Minimally reports using Tramadol and has not refilled since 2019.  At this time will provide 10 pills on a refill and recommend he minimally use these, if worsening back pain or increased need for pain medication would benefit from referral to ortho, PT, or pain management.  He is not to take Tramadol at same time as Klonopin, is aware of this.  UDS  today.      Relevant Medications   predniSONE (DELTASONE) 10 MG tablet   meloxicam (MOBIC) 15 MG tablet   clonazePAM (KLONOPIN) 1 MG tablet   traMADol (ULTRAM) 50 MG tablet   Long-term current use of benzodiazepine    Monitor use closely, minimally uses.  Refer to depression plan.  UDS today.      Relevant Orders   [id number] 11+Oxyco+Alc+Crt-Bund    Other Visit Diagnoses    Prostate cancer screening       PSA on labs today   Relevant Orders   PSA   Thyroid disorder screening       TSH on labs today   Relevant Orders   TSH       Follow up plan: Return in about 1 year (around 12/09/2021) for Annual physical -- with new PCP.

## 2020-12-09 NOTE — Assessment & Plan Note (Signed)
Chronic, stable with Prilosec.  Refills sent today.

## 2020-12-09 NOTE — Assessment & Plan Note (Signed)
Chronic, stable with minimal use of Imitrex.  Continue current medication regimen and adjust as needed.

## 2020-12-09 NOTE — Assessment & Plan Note (Signed)
Chronic, stable, denies SI/I.  Minimally uses Klonopin and refuses to trial alternate options.  Has been on this for some time started by previous PCP.  Recommend in future trial of SSRI or SNRI with reduction to discontinuation of use benzo.  Return in one year, sooner if worsening mood.  UDS today.

## 2020-12-09 NOTE — Patient Instructions (Signed)

## 2020-12-10 LAB — CBC WITH DIFFERENTIAL/PLATELET
Basophils Absolute: 0.1 10*3/uL (ref 0.0–0.2)
Basos: 1 %
EOS (ABSOLUTE): 0.1 10*3/uL (ref 0.0–0.4)
Eos: 1 %
Hematocrit: 45.7 % (ref 37.5–51.0)
Hemoglobin: 15.9 g/dL (ref 13.0–17.7)
Immature Grans (Abs): 0 10*3/uL (ref 0.0–0.1)
Immature Granulocytes: 0 %
Lymphocytes Absolute: 2.4 10*3/uL (ref 0.7–3.1)
Lymphs: 23 %
MCH: 30.6 pg (ref 26.6–33.0)
MCHC: 34.8 g/dL (ref 31.5–35.7)
MCV: 88 fL (ref 79–97)
Monocytes Absolute: 0.8 10*3/uL (ref 0.1–0.9)
Monocytes: 8 %
Neutrophils Absolute: 7.2 10*3/uL — ABNORMAL HIGH (ref 1.4–7.0)
Neutrophils: 67 %
Platelets: 313 10*3/uL (ref 150–450)
RBC: 5.19 x10E6/uL (ref 4.14–5.80)
RDW: 12.1 % (ref 11.6–15.4)
WBC: 10.5 10*3/uL (ref 3.4–10.8)

## 2020-12-10 LAB — DRUG SCREEN 764883 11+OXYCO+ALC+CRT-BUND
Amphetamines, Urine: NEGATIVE ng/mL
BENZODIAZ UR QL: NEGATIVE ng/mL
Barbiturate: NEGATIVE ng/mL
Cannabinoid Quant, Ur: NEGATIVE ng/mL
Cocaine (Metabolite): NEGATIVE ng/mL
Creatinine: 143.5 mg/dL (ref 20.0–300.0)
Ethanol: NEGATIVE %
Meperidine: NEGATIVE ng/mL
Methadone Screen, Urine: NEGATIVE ng/mL
OPIATE SCREEN URINE: NEGATIVE ng/mL
Oxycodone/Oxymorphone, Urine: NEGATIVE ng/mL
Phencyclidine: NEGATIVE ng/mL
Propoxyphene: NEGATIVE ng/mL
Tramadol: NEGATIVE ng/mL
pH, Urine: 7.2 (ref 4.5–8.9)

## 2020-12-10 LAB — COMPREHENSIVE METABOLIC PANEL
ALT: 28 IU/L (ref 0–44)
AST: 16 IU/L (ref 0–40)
Albumin/Globulin Ratio: 1.9 (ref 1.2–2.2)
Albumin: 4.5 g/dL (ref 4.0–5.0)
Alkaline Phosphatase: 52 IU/L (ref 44–121)
BUN/Creatinine Ratio: 17 (ref 9–20)
BUN: 18 mg/dL (ref 6–24)
Bilirubin Total: 0.5 mg/dL (ref 0.0–1.2)
CO2: 24 mmol/L (ref 20–29)
Calcium: 9.4 mg/dL (ref 8.7–10.2)
Chloride: 102 mmol/L (ref 96–106)
Creatinine, Ser: 1.08 mg/dL (ref 0.76–1.27)
GFR calc Af Amer: 96 mL/min/{1.73_m2} (ref >59)
GFR calc non Af Amer: 83 mL/min/{1.73_m2} (ref >59)
Globulin, Total: 2.4 g/dL (ref 1.5–4.5)
Glucose: 98 mg/dL (ref 65–99)
Potassium: 4 mmol/L (ref 3.5–5.2)
Sodium: 141 mmol/L (ref 134–144)
Total Protein: 6.9 g/dL (ref 6.0–8.5)

## 2020-12-10 LAB — LIPID PANEL W/O CHOL/HDL RATIO
Cholesterol, Total: 254 mg/dL — ABNORMAL HIGH (ref 100–199)
HDL: 56 mg/dL (ref >39)
LDL Chol Calc (NIH): 163 mg/dL — ABNORMAL HIGH (ref 0–99)
Triglycerides: 191 mg/dL — ABNORMAL HIGH (ref 0–149)
VLDL Cholesterol Cal: 35 mg/dL (ref 5–40)

## 2020-12-10 LAB — TSH: TSH: 0.668 u[IU]/mL (ref 0.450–4.500)

## 2020-12-10 LAB — PSA: Prostate Specific Ag, Serum: 0.3 ng/mL (ref 0.0–4.0)

## 2020-12-22 ENCOUNTER — Telehealth: Payer: Self-pay | Admitting: Podiatry

## 2020-12-22 ENCOUNTER — Encounter: Payer: Self-pay | Admitting: *Deleted

## 2020-12-22 NOTE — Telephone Encounter (Signed)
Lance Walsh picked up his orthotics in December.

## 2020-12-22 NOTE — Telephone Encounter (Signed)
I'm calling to let you know your letter should be in your MyChart.  If it's not there, you may have to come by to pick it up.  "Alright thank you so much.  I'll check MyChart."

## 2020-12-22 NOTE — Telephone Encounter (Signed)
Patient called inquiring about a Drs Note for work, patient stated he is required to wear steel toe boots to work but due to recent treatment he can't wear the boots. He has requested Note stating he can wear sneakers but the note needs to state the start and end time so that his employer is aware. He requested the note be sent over through MyChart, and once its placed there he can be contacted at work. [phone] page for Deirdre Evener, Please Advise

## 2021-01-06 ENCOUNTER — Ambulatory Visit: Payer: Managed Care, Other (non HMO) | Admitting: Podiatry

## 2021-01-06 ENCOUNTER — Ambulatory Visit (INDEPENDENT_AMBULATORY_CARE_PROVIDER_SITE_OTHER): Payer: Managed Care, Other (non HMO)

## 2021-01-06 ENCOUNTER — Other Ambulatory Visit: Payer: Self-pay

## 2021-01-06 DIAGNOSIS — M722 Plantar fascial fibromatosis: Secondary | ICD-10-CM | POA: Diagnosis not present

## 2021-01-06 MED ORDER — BETAMETHASONE SOD PHOS & ACET 6 (3-3) MG/ML IJ SUSP
3.0000 mg | Freq: Once | INTRAMUSCULAR | Status: AC
  Administered 2021-01-06: 3 mg via INTRA_ARTICULAR

## 2021-01-06 NOTE — Progress Notes (Signed)
   Subjective: 45 y.o. male presenting to the office today for flareup of plantar fasciitis to the right heel.  Patient continues to have heel pain to the medial lateral aspect of the heel.  He believes that his work boots that he has to wear are eliciting the pain.  He just recently got approved at work to wear sneakers.  He is hoping this is going to make a big difference.  He presents for further treatment and evaluation  Past Medical History:  Diagnosis Date  . Bipolar 1 disorder (HCC)   . Calculus of kidney   . Depression   . Fatty liver   . Plantar fasciitis      Objective: Physical Exam General: The patient is alert and oriented x3 in no acute distress.  Dermatology: Skin is warm, dry and supple bilateral lower extremities. Negative for open lesions or macerations bilateral.   Vascular: Dorsalis Pedis and Posterior Tibial pulses palpable bilateral.  Capillary fill time is immediate to all digits.  Neurological: Epicritic and protective threshold intact bilateral.   Musculoskeletal: Tenderness to palpation to the plantar aspect of the right heel along the plantar fascia medial and lateral aspects. All other joints range of motion within normal limits bilateral. Strength 5/5 in all groups bilateral.   Assessment: 1. Plantar fasciitis right  Plan of Care:  1. Patient evaluated.  Patient recently got approved at work.  Wear tennis shoes/sneakers versus the hard steel toed boots that he is required to wear traditionally.  He believes this will make a big difference in his foot pain 2. Injection of 0.5cc Celestone soluspan injected into the right plantar fascia both medial and lateral aspects 3.  Patient declined oral medication.  He states that none of the oral medications of helped alleviate his symptoms  4.  Continue custom molded orthotics, or OTC power step insoles, whichever is more comfortable 5.  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   Felecia Shelling, DPM [facility]  Dr. Felecia Shelling, DPM    [address] [place], [place] [address]                Office [phone]  Fax [phone]

## 2021-02-04 ENCOUNTER — Telehealth (INDEPENDENT_AMBULATORY_CARE_PROVIDER_SITE_OTHER): Payer: Managed Care, Other (non HMO) | Admitting: Internal Medicine

## 2021-02-04 ENCOUNTER — Encounter: Payer: Self-pay | Admitting: Internal Medicine

## 2021-02-04 DIAGNOSIS — F419 Anxiety disorder, unspecified: Secondary | ICD-10-CM

## 2021-02-04 MED ORDER — CITALOPRAM HYDROBROMIDE 20 MG PO TABS: 20.0000 mg | ORAL_TABLET | Freq: Every day | ORAL | 1 refills | Status: DC

## 2021-02-04 MED ORDER — CLONAZEPAM 1 MG PO TABS: 1.0000 mg | ORAL_TABLET | Freq: Every day | ORAL | 0 refills | Status: DC

## 2021-02-04 NOTE — Patient Instructions (Signed)
Social Anxiety Disorder, Adult Social anxiety disorder (SAD), previously called social phobia, is a mental health condition. People with SAD often feel nervous, afraid, or embarrassed when they are around other people in social situations. They worry that other people are judging or criticizing them for how they look, what they say, or how they act. SAD involves more than just feeling shy or self-conscious at times. It can cause severe emotional distress. It can interfere with activities of daily life. SAD also may lead to alcohol or drug use, and even suicide. SAD is a common mental health condition. It can develop at any time, but it usually starts in the teenage years. What are the causes? The cause of this condition is not known. It may involve genes that are passed through families. Stressful events may trigger anxiety. This disorder is also associated with an overactive amygdala. The amygdala is the part of the brain that triggers your response to strong feelings, such as fear. What increases the risk? This condition is more likely to develop in:  People who have a family history of anxiety disorders.  Women.  People who have a physical or behavioral condition that makes them feel self-conscious or nervous, such as a stutter or a long-term (chronic) disease. What are the signs or symptoms? The main symptom of this condition is fear of embarrassment caused by being criticized or judged in social situations. You may be afraid to:  Speak in public.  Go shopping.  Use a public bathroom.  Eat at a restaurant.  Go to work.  Interact with people you do not know. Extreme fear and anxiety may cause physical symptoms, including:  Blushing.  A fast heartbeat.  Sweating.  Shaky hands or voice.  Confusion.  Light-headedness.  Upset stomach, diarrhea, or vomiting.  Shortness of breath. How is this diagnosed? This condition is diagnosed based on your history, symptoms, and  behavior in social situations. You may be diagnosed with this type of anxiety if your symptoms have lasted for more than 6 months and have been present on more days than not. Your health care provider may ask you about your use of alcohol, drugs, and prescription medicines. He or she may also refer you to a mental health specialist for further evaluation or treatment. How is this treated? Treatment for this condition may include:  Cognitive behavioral therapy (CBT). This type of talk therapy helps you learn to replace negative thoughts and behaviors with positive ones. This may include learning how to use self-calming skills and other methods of managing your anxiety.  Exposure therapy. You will be exposed to social situations that cause you fear. The treatment starts with practicing self-calming in situations that cause you low levels of fear. Over time, you will progress by sustaining self-calming and managing harder situations.  Antidepressant medicines. These medicines may be used by themselves or in addition to other therapies.  Biofeedback. This process trains you to manage your body's response (physiological response) through breathing techniques and relaxation methods. You will work with a therapist while machines are used to monitor your physical symptoms.  Techniques for relaxation and managing anxiety. These include deep breathing, self-talk, meditation, visual imagery, muscle relaxation, music therapy, and yoga. These techniques are often used with other therapies to keep you calm in situations that cause you anxiety. These treatments are often used in combination.   Follow these instructions at home: Alcohol use If you drink alcohol:  Limit how much you use to: ? 0-1 drink a  day for nonpregnant women. ? 0-2 drinks a day for men.  Be aware of how much alcohol is in your drink. In the [place] one drink equals one 12 oz bottle of beer (355 mL), one 5 oz glass of wine (148 mL), or one  1 oz glass of hard liquor (44 mL). General instructions  Take over-the-counter and prescription medicines only as told by your health care provider.  Practice techniques for relaxation and managing anxiety at times you are not challenged by social anxiety.  Return to social activities using techniques you have learned, as you feel ready to do so.  Avoid caffeine and certain over-the-counter cold medicines. These may make you feel worse. Ask your pharmacists which medicines to avoid.  Keep all follow-up visits as told by your health care provider. This is important. Where to find more information  National Alliance on Mental Illness (NAMI): https://www.nami.org  Social Anxiety Association: https://socialphobia.org  Mental Health America (MHA): https://www.mhanational.org  Anxiety and Depression Association of America (ADAA): https://adaa.org/ Contact a health care provider if:  Your symptoms do not improve or get worse.  You have signs of depression, such as: ? Persistent sadness or moodiness. ? Loss of enjoyment in activities that used to bring you joy. ? Change in weight or eating. ? Changes in sleeping habits. ? Avoiding friends or family members more than usual. ? Loss of energy for normal tasks. ? Feeling guilty or worthless.  You become more isolated than you normally are.  You find it more and more difficult to speak or interact with others.  You are using drugs.  You are drinking more alcohol than usual. Get help right away if:  You harm yourself.  You have suicidal thoughts. If you ever feel like you may hurt yourself or others, or have thoughts about taking your own life, get help right away. You can go to your nearest emergency department or call:  Your local emergency services (911 in the [place]  A suicide crisis helpline, such as the National Suicide Prevention Lifeline at 1-800-273-8255. This is open 24 hours a day. Summary  Social anxiety disorder  (SAD) may cause you to feel nervous, afraid, or embarrassed when you are around other people in social situations.  SAD is a common mental disorder. It can develop at any time, but it usually starts in the teenage years.  Treatment includes talk therapy, exposure therapy, medicines, biofeedback, and relaxation techniques. It can involve a combination of treatments. This information is not intended to replace advice given to you by your health care provider. Make sure you discuss any questions you have with your health care provider. Document Revised: 04/18/2019 Document Reviewed: 04/18/2019 Elsevier Patient Education  2021 Elsevier Inc.  

## 2021-02-04 NOTE — Progress Notes (Signed)
There were no vitals taken for this visit.   Subjective:    Patient ID: Lance Walsh, male    DOB: 12/28/1975, 45 y.o.   MRN: [id number]  HPI: Lance Walsh is a 45 y.o. male   This visit was completed via  due to the restrictions of the COVID-19 pandemic. All issues as above were discussed and addressed.  If it was felt that the patient should be evaluated in the office, they were directed there. The patient verbally consented to this visit. Location of the patient: home Location of the provider: work Those involved with this call:  Provider: Loura Pardon, MD CMA: Tristan Schroeder, CMA  Time spent on call: 15 minutes on the phone discussing health concerns. 20 minutes total spent in review of patient's record and preparation of their chart.  Anxiety Presents for initial (has had anxiety to get to work , last week left on thursday had to leave early took weekend off on monday - as soon as he woke up tensed up, no energy to go to work , has been calling out of work , couldnt go to work. ) visit. Episode onset: people at work arent showing up, people have been calling out and has had increased work load - has had a lot of stress at work. feels embrassed about it. Symptoms include excessive worry, muscle tension, nervous/anxious behavior, restlessness and shortness of breath. Patient reports no chest pain, compulsions, confusion, decreased concentration, depressed mood, dizziness, dry mouth, feeling of choking, hyperventilation, impotence, insomnia, irritability, malaise, nausea, obsessions, palpitations, panic or suicidal ideas. Primary symptoms comment: has been taking clonazepam for such , hes been ok when he takes time off - but now he has to take more time off to get himself together. hasnt had a problem like this. 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 Episode frequency: has been feeling overworked. has been working at this job x 26 yrs.  The  severity of symptoms is interfering with daily activities (has been on klonipin x  4 - 5 yrs ago doenst used a whole bottle for a whole year. has beenneeding more ).      Chief Complaint  Patient presents with  . Anxiety    For past few months, Feeling anxious, flying off handle, very moody with others, and finding it hard to go to work, and hyperventilating had to leave early on Thursday and has not been back since    Relevant past medical, surgical, family and social history reviewed and updated as indicated. Interim medical history since our last visit reviewed. Allergies and medications reviewed and updated.  Review of Systems  Constitutional: Negative for activity change, appetite change, chills, diaphoresis and irritability.  Respiratory: Positive for shortness of breath. Negative for apnea, cough, choking, chest tightness, wheezing and stridor.   Cardiovascular: Negative for chest pain and palpitations.  Gastrointestinal: Negative for nausea.  Genitourinary: Negative for impotence.  Neurological: Negative for dizziness.  Psychiatric/Behavioral: Positive for behavioral problems. Negative for agitation, confusion, decreased concentration, hallucinations and suicidal ideas. The patient is nervous/anxious. The patient does not have insomnia.     Per HPI unless specifically indicated above     Objective:    There were no vitals taken for this visit.  Wt Readings from Last 3 Encounters:  12/09/20 203 lb 3.2 oz (92.2 kg)  01/14/20 210 lb (95.3 kg)  11/01/19 204 lb (92.5 kg)    Physical Exam  Results for orders placed or performed in visit on 12/09/20  CBC with Differential/Platelet  Result Value Ref Range   WBC 10.5 3.4 - 10.8 x10E3/uL   RBC 5.19 4.14 - 5.80 x10E6/uL   Hemoglobin 15.9 13.0 - 17.7 g/dL   Hematocrit 79.8 92.1 - 51.0 %   MCV 88 79 - 97 fL   MCH 30.6 26.6 - 33.0 pg   MCHC 34.8 31.5 - 35.7 g/dL   RDW 19.4 17.4 - 08.1 %   Platelets 313 150 - 450 x10E3/uL    Neutrophils 67 Not Estab. %   Lymphs 23 Not Estab. %   Monocytes 8 Not Estab. %   Eos 1 Not Estab. %   Basos 1 Not Estab. %   Neutrophils Absolute 7.2 (H) 1.4 - 7.0 x10E3/uL   Lymphocytes Absolute 2.4 0.7 - 3.1 x10E3/uL   Monocytes Absolute 0.8 0.1 - 0.9 x10E3/uL   EOS (ABSOLUTE) 0.1 0.0 - 0.4 x10E3/uL   Basophils Absolute 0.1 0.0 - 0.2 x10E3/uL   Immature Granulocytes 0 Not Estab. %   Immature Grans (Abs) 0.0 0.0 - 0.1 x10E3/uL  Comprehensive metabolic panel  Result Value Ref Range   Glucose 98 65 - 99 mg/dL   BUN 18 6 - 24 mg/dL   Creatinine, Ser 4.48 0.76 - 1.27 mg/dL   GFR calc non Af Amer 83 >59 mL/min/1.73   GFR calc Af Amer 96 >59 mL/min/1.73   BUN/Creatinine Ratio 17 9 - 20   Sodium 141 134 - 144 mmol/L   Potassium 4.0 3.5 - 5.2 mmol/L   Chloride 102 96 - 106 mmol/L   CO2 24 20 - 29 mmol/L   Calcium 9.4 8.7 - 10.2 mg/dL   Total Protein 6.9 6.0 - 8.5 g/dL   Albumin 4.5 4.0 - 5.0 g/dL   Globulin, Total 2.4 1.5 - 4.5 g/dL   Albumin/Globulin Ratio 1.9 1.2 - 2.2   Bilirubin Total 0.5 0.0 - 1.2 mg/dL   Alkaline Phosphatase 52 44 - 121 IU/L   AST 16 0 - 40 IU/L   ALT 28 0 - 44 IU/L  Lipid Panel w/o Chol/HDL Ratio  Result Value Ref Range   Cholesterol, Total 254 (H) 100 - 199 mg/dL   Triglycerides 185 (H) 0 - 149 mg/dL   HDL 56 >63 mg/dL   VLDL Cholesterol Cal 35 5 - 40 mg/dL   LDL Chol Calc (NIH) 149 (H) 0 - 99 mg/dL  TSH  Result Value Ref Range   TSH 0.668 0.450 - 4.500 uIU/mL  PSA  Result Value Ref Range   Prostate Specific Ag, Serum 0.3 0.0 - 4.0 ng/mL  [id number] 11+Oxyco+Alc+Crt-Bund  Result Value Ref Range   Ethanol Negative Cutoff=0.020 %   Amphetamines, Urine Negative Cutoff=1000 ng/mL   Barbiturate Negative Cutoff=200 ng/mL   BENZODIAZ UR QL Negative Cutoff=200 ng/mL   Cannabinoid Quant, Ur Negative Cutoff=50 ng/mL   Cocaine (Metabolite) Negative Cutoff=300 ng/mL   OPIATE SCREEN URINE Negative Cutoff=300 ng/mL   Oxycodone/Oxymorphone, Urine Negative  Cutoff=300 ng/mL   Phencyclidine Negative Cutoff=25 ng/mL   Methadone Screen, Urine Negative Cutoff=300 ng/mL   Propoxyphene Negative Cutoff=300 ng/mL   Meperidine Negative Cutoff=200 ng/mL   Tramadol Negative Cutoff=200 ng/mL   Creatinine 143.5 20.0 - 300.0 mg/dL   pH, Urine 7.2 4.5 - 8.9      Assessment & Plan:  1. Anxiety / depression : continue as anxious over the phone and repetitious about him not having to take them off in the past however suddenly  needing to take more time off unsure if he has more than just anxiety going on. He will be referred to CCM for acute management of his anxiety.  Consider referral to psychiatry as well. will start celexa 10 mg daily , potential Side effects dw pt. to call office if develops any SE. will fu with me in 1 month for such. pt verbalised understanding. Ok to be off of work till Monday.    Problem List Items Addressed This Visit   None   Visit Diagnoses    Anxiety    -  Primary   Relevant Medications   citalopram (CELEXA) 20 MG tablet   Other Relevant Orders   AMB Referral to [facility]       Follow up plan: Return in about 2 weeks (around 02/18/2021).

## 2021-02-05 ENCOUNTER — Ambulatory Visit: Payer: Managed Care, Other (non HMO) | Admitting: Internal Medicine

## 2021-02-05 ENCOUNTER — Telehealth: Payer: Self-pay

## 2021-02-05 NOTE — Chronic Care Management (AMB) (Signed)
  Care Management   Outreach Note  02/05/2021 Name: Lance Walsh MRN: [id number] DOB: November 16, 1976  Referred by: Loura Pardon, MD Reason for referral : Care Coordination (Outreach to schedule Emergent  referral with LCSW )   An unsuccessful telephone outreach was attempted today. The patient was referred to the case management team for assistance with care management and care coordination.   Follow Up Plan: A HIPAA compliant phone message was left for the patient providing contact information and requesting a return call.  The care management team will reach out to the patient again over the next 2 days.  If patient returns call to provider office, please advise to call Embedded Care Management Care Guide Penne Lash  at [phone]  Penne Lash, RMA Care Guide, Embedded Care Coordination [facility]  [place], [place] [address] Direct Dial: [phone] [email or web address]: [email or web address]

## 2021-02-06 NOTE — Chronic Care Management (AMB) (Signed)
  Care Management   Note  02/06/2021 Name: Lance Walsh MRN: [id number] DOB: Mar 31, 1976  YANKY VANDERBURG is a 45 y.o. year old male who is a primary care patient of Vigg, Avanti, MD. I reached out to [name] by phone today in response to a referral sent by Mr. Curly Mackowski Wix's PCP Loura Pardon, MD .    Mr. Mi was given information about care management services today including:  1. Care management services include personalized support from designated clinical staff supervised by his physician, including individualized plan of care and coordination with other care providers 2. 24/7 contact phone numbers for assistance for urgent and routine care needs. 3. The patient may stop care management services at any time by phone call to the office staff.  Patient agreed to services and verbal consent obtained.   Follow up plan: Telephone appointment with care management team member scheduled for:02/09/2021  Penne Lash, RMA Care Guide, Embedded Care Coordination [facility]  [place], [place] [address] Direct Dial: [phone] [email or web address]

## 2021-02-09 ENCOUNTER — Ambulatory Visit: Payer: Managed Care, Other (non HMO) | Admitting: Licensed Clinical Social Worker

## 2021-02-09 DIAGNOSIS — F3342 Major depressive disorder, recurrent, in full remission: Secondary | ICD-10-CM

## 2021-02-09 NOTE — Patient Instructions (Signed)
Licensed Clinical Social Worker Visit Information  Goals we discussed today:  Goals Addressed            This Visit's Progress   . Track and Manage My Symptoms-Depression       Timeframe:  Long-Range Goal Priority:  Medium Start Date:   02/09/21                        Expected End Date:  05/12/21               Follow Up Date 03/30/21   - avoid negative self-talk - develop a personal safety plan - develop a plan to deal with triggers like holidays, anniversaries - exercise at least 2 to 3 times per week - have a plan for how to handle bad days - journal feelings and what helps to feel better or worse - spend time or talk with others at least 2 to 3 times per week - spend time or talk with others every day - watch for early signs of feeling worse - write in journal every day    Why is this important?    Keeping track of your progress will help your treatment team find the right mix of medicine and therapy for you.   Write in your journal every day.   Day-to-day changes in depression symptoms are normal. It may be more helpful to check your progress at the end of each week instead of every day.      Current barriers:   . 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  Knowledge Deficits related to short term plan for care coordination  needs and long term plans for chronic disease management . Lacks knowledge of where and how to connect for mental health support . Needs Support, Education, and Care Coordination in order to meet unmet need.  Clinical Goal(s): Over the next 120 days, patient will work with SW to reduce or manage symptoms of anxiety and depression until connected for ongoing counseling or finds another medication that helps his symptom relief.  Clinical Interventions:  . Assessed patient's previous treatment, needs, coping skills, current treatment,  support system and barriers to care . Patient interviewed and appropriate assessments performed or reviewed: brief mental health assessment;Suicidal Ideation/Homicidal Ideation: No . Provided basic mental health support, education and interventions ( EMMI education) . Patient reports that he took Celexa for 3 days and stopped because it started causing him sexual side effects. Patient has an upcoming PCP appointment next week and was advised to discuss this with her. However, patent reports that he does not want to be on another medication and he does not like medications. . Discussed several options for long term counseling based on need and insurance but patient declined wanting mental health support (therapy or psychiatry)  . Reviewed mental health medications with patient prescribed by PCP and discussed compliance  . Other interventions include: Motivational Interviewing . Solution-Focused Strategies . Mindfulness or Relaxation Training  . Collaboration with PCP regarding development and update of comprehensive plan of care as evidenced by provider attestation and co-signature . Inter-disciplinary care team collaboration (see longitudinal plan of care) . Patient reports that social anxiety is a new problem for him and is contributed to is work environment. He is hopeful that he can take a few weeks off from work once he has his nasal procedure .  CCM LCSW educated patient on healthy coping skills to implement into is daily routine to combat anxiety and depressive symptoms once they arise.   Patient Goals/Self-Care Activities: Over the next 120 days . Implement interventions discussed today to decreases symptoms of anxiety and depression and increase knowledge and/or ability of: coping skills, healthy habits, self-management skills, and stress reduction.         [name] was given information about Chronic Care Management services today including:  1. CCM service includes personalized  support from designated clinical staff supervised by his physician, including individualized plan of care and coordination with other care providers 2. 24/7 contact phone numbers for assistance for urgent and routine care needs. 3. Service will only be billed when office clinical staff spend 20 minutes or more in a month to coordinate care. 4. Only one practitioner may furnish and bill the service in a calendar month. 5. The patient may stop CCM services at any time (effective at the end of the month) by phone call to the office staff. 6. The patient will be responsible for cost sharing (co-pay) of up to 20% of the service fee (after annual deductible is met).  Patient agreed to services and verbal consent obtained.    

## 2021-02-09 NOTE — Chronic Care Management (AMB) (Signed)
Care Management Clinical Social Work Note  02/09/2021 Name: Lance Walsh MRN: [id number] DOB: February 22, 1976  Lance Walsh is a 45 y.o. year old male who is a primary care patient of Vigg, Avanti, MD.  The Care Management team was consulted for assistance with chronic disease management and coordination needs.  Engaged with patient by telephone for initial visit in response to provider referral for social work chronic care management and care coordination services  Consent to Services:  Lance Walsh was given information about Care Management services today including:  1. Care Management services includes personalized support from designated clinical staff supervised by his physician, including individualized plan of care and coordination with other care providers 2. 24/7 contact phone numbers for assistance for urgent and routine care needs. 3. The patient may stop case management services at any time by phone call to the office staff.  Patient agreed to services and consent obtained.   Assessment: Review of patient past medical history, allergies, medications, and health status, including review of relevant consultants reports was performed today as part of a comprehensive evaluation and provision of chronic care management and care coordination services.  SDOH (Social Determinants of Health) assessments and interventions performed:    Advanced Directives Status: See Care Plan for related entries. and Not addressed in this encounter.  Care Plan  Allergies  Allergen Reactions  . Shellfish-Derived Products Swelling    Throat Closing  . Lac Bovis   . Lactose Intolerance (Gi)     Diarrhea     Outpatient Encounter Medications as of 02/09/2021  Medication Sig Note  . azelastine (OPTIVAR) 0.05 % ophthalmic solution INSTILL 1 DROP IN AFFECTED EYES TWICE A DAY.   . cefdinir (OMNICEF) 300 MG capsule Take 300 mg by mouth 2 (two) times daily. (Patient not taking: Reported on 02/04/2021)   .  cetirizine (ZYRTEC) 10 MG tablet Take 1 tablet (10 mg total) by mouth daily.   . citalopram (CELEXA) 20 MG tablet Take 1 tablet (20 mg total) by mouth daily.   . clonazePAM (KLONOPIN) 1 MG tablet Take 1 tablet (1 mg total) by mouth daily.   . clotrimazole-betamethasone (LOTRISONE) cream Apply 1 application topically 2 (two) times daily.   Marland Kitchen EPINEPHrine (EPIPEN IJ) Inject as directed as needed.   . fluorometholone (FML) 0.1 % ophthalmic suspension    . fluticasone (FLONASE) 50 MCG/ACT nasal spray Place 2 sprays into both nostrils daily.   Marland Kitchen omeprazole (PRILOSEC) 20 MG capsule Take 1 capsule (20 mg total) by mouth 2 (two) times daily before a meal.   . predniSONE (DELTASONE) 10 MG tablet Take by mouth. (Patient not taking: Reported on 02/04/2021)   . PREVIDENT 5000 SENSITIVE 1.1-5 % GEL Place 1 application onto teeth 2 (two) times daily.   . SUMAtriptan (IMITREX) 100 MG tablet Take 1 tablet (100 mg total) by mouth every 2 (two) hours as needed for migraine. May repeat in 2 hours if headache persists or recurs.   . traMADol (ULTRAM) 50 MG tablet TAKE ONE TO TWO TABLETS EVERY 6 HOURS AS NEEDED FOR SEVERE PAIN ONLY.   Terald Sleeper HFA 108 (90 Base) MCG/ACT inhaler  11/01/2019: As needed   No facility-administered encounter medications on file as of 02/09/2021.    Patient Active Problem List   Diagnosis Date Noted  . Chronic back pain 12/09/2020  . Gastroesophageal reflux disease 12/09/2020  . Long-term current use of benzodiazepine 12/09/2020  . Elevated low density lipoprotein (LDL) cholesterol level 12/06/2020  . Allergic  rhinitis 02/13/2019  . Insomnia 11/13/2018  . Migraine headache 12/29/2016  . Depression 11/04/2015    Conditions to be addressed/monitored: Anxiety and Depression; Mental Health Concerns   Care Plan : General Social Work (Adult)  Updates made by Gustavus BryantJoyce, Sameer Teeple L, LCSW since 02/09/2021 12:00 AM    Problem: Response to Treatment (Depression)     Long-Range Goal: Response to  Treatment Maximized   Start Date: 02/09/2021  Priority: Medium  Note:   Timeframe:  Long-Range Goal Priority:  Medium Start Date:   02/09/21                        Expected End Date:  05/12/21               Follow Up Date 03/30/21   - avoid negative self-talk - develop a personal safety plan - develop a plan to deal with triggers like holidays, anniversaries - exercise at least 2 to 3 times per week - have a plan for how to handle bad days - journal feelings and what helps to feel better or worse - spend time or talk with others at least 2 to 3 times per week - spend time or talk with others every day - watch for early signs of feeling worse - write in journal every day    Why is this important?    Keeping track of your progress will help your treatment team find the right mix of medicine and therapy for you.   Write in your journal every day.   Day-to-day changes in depression symptoms are normal. It may be more helpful to check your progress at the end of each week instead of every day.      Current barriers:   . 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  Knowledge Deficits related to short term plan for care coordination  needs and long term plans for chronic disease management . Lacks knowledge of where and how to connect for mental health support . Needs Support, Education, and Care Coordination in order to meet unmet need.  Clinical Goal(s): Over the next 120 days, patient will work with SW to reduce or manage symptoms of anxiety and depression until connected for ongoing counseling or finds another medication that helps his symptom relief.  Clinical Interventions:  . Assessed patient's previous treatment, needs, coping skills, current treatment, support system and barriers to care . Patient interviewed and appropriate assessments performed or reviewed: brief  mental health assessment;Suicidal Ideation/Homicidal Ideation: No . Provided basic mental health support, education and interventions ( EMMI education) . Patient reports that he took Celexa for 3 days and stopped because it started causing him sexual side effects. Patient has an upcoming PCP appointment next week and was advised to discuss this with her. However, patent reports that he does not want to be on another medication and he does not like medications. . Discussed several options for long term counseling based on need and insurance but patient declined wanting mental health support (therapy or psychiatry)  . Reviewed mental health medications with patient prescribed by PCP and discussed compliance  . Other interventions include: Motivational Interviewing . Solution-Focused Strategies . Mindfulness or Relaxation Training  . Collaboration with PCP regarding development and update of comprehensive plan of care as evidenced by provider attestation and co-signature . Inter-disciplinary care team collaboration (see longitudinal plan of care) .  Patient reports that social anxiety is a new problem for him and is contributed to is work environment. He is hopeful that he can take a few weeks off from work once he has his nasal procedure . CCM LCSW educated patient on healthy coping skills to implement into is daily routine to combat anxiety and depressive symptoms once they arise.   Patient Goals/Self-Care Activities: Over the next 120 days . Implement interventions discussed today to decreases symptoms of anxiety and depression and increase knowledge and/or ability of: coping skills, healthy habits, self-management skills, and stress reduction.   Social Anxiety Disorder, Adult Social anxiety disorder (SAD), previously called social phobia, is a mental health condition. People with SAD often feel nervous, afraid, or embarrassed when they are around other people in social situations. They worry that  other people are judging or criticizing them for how they look, what they say, or how they act. SAD involves more than just feeling shy or self-conscious at times. It can cause severe emotional distress. It can interfere with activities of daily life. SAD also may lead to alcohol or drug use, and even suicide. SAD is a common mental health condition. It can develop at any time, but it usually starts in the teenage years. What are the causes? The cause of this condition is not known. It may involve genes that are passed through families. Stressful events may trigger anxiety. This disorder is also associated with an overactive amygdala. The amygdala is the part of the brain that triggers your response to strong feelings, such as fear. What increases the risk? This condition is more likely to develop in:  People who have a family history of anxiety disorders.  Women.  People who have a physical or behavioral condition that makes them feel self-conscious or nervous, such as a stutter or a long-term (chronic) disease. What are the signs or symptoms? The main symptom of this condition is fear of embarrassment caused by being criticized or judged in social situations. You may be afraid to:  Speak in public.  Go shopping.  Use a public bathroom.  Eat at a restaurant.  Go to work.  Interact with people you do not know. Extreme fear and anxiety may cause physical symptoms, including:  Blushing.  A fast heartbeat.  Sweating.  Shaky hands or voice.  Confusion.  Light-headedness.  Upset stomach, diarrhea, or vomiting.  Shortness of breath. How is this diagnosed? This condition is diagnosed based on your history, symptoms, and behavior in social situations. You may be diagnosed with this type of anxiety if your symptoms have lasted for more than 6 months and have been present on more days than not. Your health care provider may ask you about your use of alcohol, drugs, and  prescription medicines. He or she may also refer you to a mental health specialist for further evaluation or treatment. How is this treated? Treatment for this condition may include:  Cognitive behavioral therapy (CBT). This type of talk therapy helps you learn to replace negative thoughts and behaviors with positive ones. This may include learning how to use self-calming skills and other methods of managing your anxiety.  Exposure therapy. You will be exposed to social situations that cause you fear. The treatment starts with practicing self-calming in situations that cause you low levels of fear. Over time, you will progress by sustaining self-calming and managing harder situations.  Antidepressant medicines. These medicines may be used by themselves or in addition to other therapies.  Biofeedback. This  process trains you to manage your body's response (physiological response) through breathing techniques and relaxation methods. You will work with a therapist while machines are used to monitor your physical symptoms.  Techniques for relaxation and managing anxiety. These include deep breathing, self-talk, meditation, visual imagery, muscle relaxation, music therapy, and yoga. These techniques are often used with other therapies to keep you calm in situations that cause you anxiety. These treatments are often used in combination.     Follow these instructions at home: Alcohol use If you drink alcohol: 1. Limit how much you use to: ? 0-1 drink a day for nonpregnant women. ? 0-2 drinks a day for men. 2. Be aware of how much alcohol is in your drink. In the [place] one drink equals one 12 oz bottle of beer (355 mL), one 5 oz glass of wine (148 mL), or one 1 oz glass of hard liquor (44 mL). General instructions  Take over-the-counter and prescription medicines only as told by your health care provider.  Practice techniques for relaxation and managing anxiety at times you are not challenged by  social anxiety.  Return to social activities using techniques you have learned, as you feel ready to do so.  Avoid caffeine and certain over-the-counter cold medicines. These may make you feel worse. Ask your pharmacists which medicines to avoid.  Keep all follow-up visits as told by your health care provider. This is important. Where to find more information  The [employer] on Mental Illness (NAMI): https://www.nami.org  Social Anxiety Association: https://socialphobia.org  Mental Health America [facility]): https://www.stevens-henderson.com/  Anxiety and Depression Association of [place] (ADAA): http://miller-hamilton.net/ Contact a health care provider if: 1. Your symptoms do not improve or get worse. 2. You have signs of depression, such as: ? Persistent sadness or moodiness. ? Loss of enjoyment in activities that used to bring you joy. ? Change in weight or eating. ? Changes in sleeping habits. ? Avoiding friends or family members more than usual. ? Loss of energy for normal tasks. ? Feeling guilty or worthless. 3. You become more isolated than you normally are. 4. You find it more and more difficult to speak or interact with others. 5. You are using drugs. 6. You are drinking more alcohol than usual. Get help right away if:  You harm yourself.  You have suicidal thoughts. If you ever feel like you may hurt yourself or others, or have thoughts about taking your own life, get help right away. You can go to your nearest emergency department or call:  Your local emergency services (911 in the [place]  A suicide crisis helpline, such as the National Suicide Prevention Lifeline at (765)419-8853. This is open 24 hours a day. Summary  Social anxiety disorder (SAD) may cause you to feel nervous, afraid, or embarrassed when you are around other people in social situations.  SAD is a common mental disorder. It can develop at any time, but it usually starts in the teenage years.  Treatment  includes talk therapy, exposure therapy, medicines, biofeedback, and relaxation techniques. It can involve a combination of treatments. This information is not intended to replace advice given to you by your health care provider. Make sure you discuss any questions you have with your health care provider. Document Revised: 04/18/2019 Document Reviewed: 04/18/2019 Elsevier Patient Education  2021 Elsevier Inc.     Follow Up Plan: SW will follow up with patient by phone over the next quarter  Dickie La, BSW, MSW, LCSW [facility]  [email or web address] Phone: [phone]

## 2021-02-18 ENCOUNTER — Other Ambulatory Visit: Payer: Self-pay

## 2021-02-18 ENCOUNTER — Encounter: Payer: Self-pay | Admitting: Internal Medicine

## 2021-02-18 ENCOUNTER — Ambulatory Visit: Payer: Managed Care, Other (non HMO) | Admitting: Internal Medicine

## 2021-02-18 VITALS — BP 125/84 | HR 91 | Temp 98.8°F | Ht 69.02 in | Wt 208.2 lb

## 2021-02-18 DIAGNOSIS — R519 Headache, unspecified: Secondary | ICD-10-CM

## 2021-02-18 DIAGNOSIS — F419 Anxiety disorder, unspecified: Secondary | ICD-10-CM

## 2021-02-18 DIAGNOSIS — G8929 Other chronic pain: Secondary | ICD-10-CM | POA: Diagnosis not present

## 2021-02-18 MED ORDER — VENLAFAXINE HCL ER 37.5 MG PO CP24: 37.5000 mg | ORAL_CAPSULE | Freq: Every day | ORAL | 1 refills | Status: DC

## 2021-02-18 NOTE — Progress Notes (Signed)
BP 125/84   Pulse 91   Temp 98.8 F (37.1 C) (Oral)   Ht 5' 9.02" (1.753 m)   Wt 208 lb 3.2 oz (94.4 kg)   SpO2 94%   BMI 30.73 kg/m    Subjective:    Patient ID: Lance Walsh, male    DOB: December 24, 1975, 45 y.o.   MRN: [id number]  HPI: Lance Walsh is a 45 y.o. male  Pt is here for a   Anxiety Presents for follow-up (celexa caused sesual disfunction) visit. Symptoms include irritability and nervous/anxious behavior. Patient reports no chest pain, compulsions, confusion, dizziness, feeling of choking, malaise, muscle tension, nausea, obsessions, palpitations, panic, restlessness, shortness of breath or suicidal ideas. Depressed mood: effexor.    Migraine  This is a chronic problem. Pertinent negatives include no dizziness or nausea.    Chief Complaint  Patient presents with  . Anxiety    Stopped taking medication due to sexual side effects and feeling cloudy headed.    Relevant past medical, surgical, family and social history reviewed and updated as indicated. Interim medical history since our last visit reviewed. Allergies and medications reviewed and updated.  Review of Systems  Constitutional: Positive for irritability.  Respiratory: Negative for shortness of breath.   Cardiovascular: Negative for chest pain and palpitations.  Gastrointestinal: Negative for nausea.  Neurological: Negative for dizziness.  Psychiatric/Behavioral: Negative for confusion and suicidal ideas. The patient is nervous/anxious.     Per HPI unless specifically indicated above     Objective:    BP 125/84   Pulse 91   Temp 98.8 F (37.1 C) (Oral)   Ht 5' 9.02" (1.753 m)   Wt 208 lb 3.2 oz (94.4 kg)   SpO2 94%   BMI 30.73 kg/m   Wt Readings from Last 3 Encounters:  02/18/21 208 lb 3.2 oz (94.4 kg)  12/09/20 203 lb 3.2 oz (92.2 kg)  01/14/20 210 lb (95.3 kg)    Physical Exam Vitals and nursing note reviewed.  Constitutional:      General: He is not in acute distress.     Appearance: Normal appearance. He is not ill-appearing or diaphoretic.  HENT:     Head: Normocephalic and atraumatic.     Right Ear: Tympanic membrane and external ear normal. There is no impacted cerumen.     Left Ear: External ear normal.     Nose: No congestion or rhinorrhea.     Mouth/Throat:     Pharynx: No oropharyngeal exudate or posterior oropharyngeal erythema.  Eyes:     Conjunctiva/sclera: Conjunctivae normal.     Pupils: Pupils are equal, round, and reactive to light.  Cardiovascular:     Rate and Rhythm: Normal rate and regular rhythm.     Heart sounds: No murmur heard. No friction rub. No gallop.   Pulmonary:     Effort: No respiratory distress.     Breath sounds: No stridor. No wheezing or rhonchi.  Chest:     Chest wall: No tenderness.  Abdominal:     General: Abdomen is flat. Bowel sounds are normal.     Palpations: Abdomen is soft. There is no mass.     Tenderness: There is no abdominal tenderness.  Musculoskeletal:     Cervical back: Normal range of motion and neck supple. No rigidity or tenderness.     Left lower leg: No edema.  Skin:    General: Skin is warm and dry.  Neurological:     Mental Status: He is  alert.     Results for orders placed or performed in visit on 12/09/20  CBC with Differential/Platelet  Result Value Ref Range   WBC 10.5 3.4 - 10.8 x10E3/uL   RBC 5.19 4.14 - 5.80 x10E6/uL   Hemoglobin 15.9 13.0 - 17.7 g/dL   Hematocrit 75.6 43.3 - 51.0 %   MCV 88 79 - 97 fL   MCH 30.6 26.6 - 33.0 pg   MCHC 34.8 31.5 - 35.7 g/dL   RDW 29.5 18.8 - 41.6 %   Platelets 313 150 - 450 x10E3/uL   Neutrophils 67 Not Estab. %   Lymphs 23 Not Estab. %   Monocytes 8 Not Estab. %   Eos 1 Not Estab. %   Basos 1 Not Estab. %   Neutrophils Absolute 7.2 (H) 1.4 - 7.0 x10E3/uL   Lymphocytes Absolute 2.4 0.7 - 3.1 x10E3/uL   Monocytes Absolute 0.8 0.1 - 0.9 x10E3/uL   EOS (ABSOLUTE) 0.1 0.0 - 0.4 x10E3/uL   Basophils Absolute 0.1 0.0 - 0.2 x10E3/uL    Immature Granulocytes 0 Not Estab. %   Immature Grans (Abs) 0.0 0.0 - 0.1 x10E3/uL  Comprehensive metabolic panel  Result Value Ref Range   Glucose 98 65 - 99 mg/dL   BUN 18 6 - 24 mg/dL   Creatinine, Ser 6.06 0.76 - 1.27 mg/dL   GFR calc non Af Amer 83 >59 mL/min/1.73   GFR calc Af Amer 96 >59 mL/min/1.73   BUN/Creatinine Ratio 17 9 - 20   Sodium 141 134 - 144 mmol/L   Potassium 4.0 3.5 - 5.2 mmol/L   Chloride 102 96 - 106 mmol/L   CO2 24 20 - 29 mmol/L   Calcium 9.4 8.7 - 10.2 mg/dL   Total Protein 6.9 6.0 - 8.5 g/dL   Albumin 4.5 4.0 - 5.0 g/dL   Globulin, Total 2.4 1.5 - 4.5 g/dL   Albumin/Globulin Ratio 1.9 1.2 - 2.2   Bilirubin Total 0.5 0.0 - 1.2 mg/dL   Alkaline Phosphatase 52 44 - 121 IU/L   AST 16 0 - 40 IU/L   ALT 28 0 - 44 IU/L  Lipid Panel w/o Chol/HDL Ratio  Result Value Ref Range   Cholesterol, Total 254 (H) 100 - 199 mg/dL   Triglycerides 301 (H) 0 - 149 mg/dL   HDL 56 >60 mg/dL   VLDL Cholesterol Cal 35 5 - 40 mg/dL   LDL Chol Calc (NIH) 109 (H) 0 - 99 mg/dL  TSH  Result Value Ref Range   TSH 0.668 0.450 - 4.500 uIU/mL  PSA  Result Value Ref Range   Prostate Specific Ag, Serum 0.3 0.0 - 4.0 ng/mL  [id number] 11+Oxyco+Alc+Crt-Bund  Result Value Ref Range   Ethanol Negative Cutoff=0.020 %   Amphetamines, Urine Negative Cutoff=1000 ng/mL   Barbiturate Negative Cutoff=200 ng/mL   BENZODIAZ UR QL Negative Cutoff=200 ng/mL   Cannabinoid Quant, Ur Negative Cutoff=50 ng/mL   Cocaine (Metabolite) Negative Cutoff=300 ng/mL   OPIATE SCREEN URINE Negative Cutoff=300 ng/mL   Oxycodone/Oxymorphone, Urine Negative Cutoff=300 ng/mL   Phencyclidine Negative Cutoff=25 ng/mL   Methadone Screen, Urine Negative Cutoff=300 ng/mL   Propoxyphene Negative Cutoff=300 ng/mL   Meperidine Negative Cutoff=200 ng/mL   Tramadol Negative Cutoff=200 ng/mL   Creatinine 143.5 20.0 - 300.0 mg/dL   pH, Urine 7.2 4.5 - 8.9      Assessment & Plan:  1. Anxiety :   Consider referral to  psychiatry as well. Will start pt on effexor xr  potential Side effects  dw pt. to call office if develops any SE. will fu with me in 1 month for such. pt verbalised understanding.   2. Migraines : will refer pt to neurology.  Pt has chronic headaches and has had intractable ones. He has hadnt been seen by any specialist yet and has     Problem List Items Addressed This Visit   None

## 2021-02-19 ENCOUNTER — Encounter: Payer: Self-pay | Admitting: Internal Medicine

## 2021-02-19 NOTE — Patient Instructions (Signed)
Pt is for a fu on new meds was placed on celexa last visit which caused him to have SE, he is now off of meds.

## 2021-03-12 ENCOUNTER — Other Ambulatory Visit: Payer: Self-pay

## 2021-03-12 ENCOUNTER — Other Ambulatory Visit: Payer: Managed Care, Other (non HMO)

## 2021-03-12 DIAGNOSIS — F419 Anxiety disorder, unspecified: Secondary | ICD-10-CM

## 2021-03-13 LAB — CBC WITH DIFFERENTIAL/PLATELET
Basophils Absolute: 0.1 10*3/uL (ref 0.0–0.2)
Basos: 1 %
EOS (ABSOLUTE): 0.3 10*3/uL (ref 0.0–0.4)
Eos: 4 %
Hematocrit: 47.8 % (ref 37.5–51.0)
Hemoglobin: 16.3 g/dL (ref 13.0–17.7)
Immature Grans (Abs): 0 10*3/uL (ref 0.0–0.1)
Immature Granulocytes: 0 %
Lymphocytes Absolute: 2.3 10*3/uL (ref 0.7–3.1)
Lymphs: 31 %
MCH: 29.6 pg (ref 26.6–33.0)
MCHC: 34.1 g/dL (ref 31.5–35.7)
MCV: 87 fL (ref 79–97)
Monocytes Absolute: 0.6 10*3/uL (ref 0.1–0.9)
Monocytes: 8 %
Neutrophils Absolute: 4.3 10*3/uL (ref 1.4–7.0)
Neutrophils: 56 %
Platelets: 290 10*3/uL (ref 150–450)
RBC: 5.51 x10E6/uL (ref 4.14–5.80)
RDW: 12.2 % (ref 11.6–15.4)
WBC: 7.7 10*3/uL (ref 3.4–10.8)

## 2021-03-13 LAB — COMPREHENSIVE METABOLIC PANEL
ALT: 52 IU/L — ABNORMAL HIGH (ref 0–44)
AST: 27 IU/L (ref 0–40)
Albumin/Globulin Ratio: 2.3 — ABNORMAL HIGH (ref 1.2–2.2)
Albumin: 4.5 g/dL (ref 4.0–5.0)
Alkaline Phosphatase: 56 IU/L (ref 44–121)
BUN/Creatinine Ratio: 22 — ABNORMAL HIGH (ref 9–20)
BUN: 21 mg/dL (ref 6–24)
Bilirubin Total: 0.4 mg/dL (ref 0.0–1.2)
CO2: 21 mmol/L (ref 20–29)
Calcium: 9.1 mg/dL (ref 8.7–10.2)
Chloride: 101 mmol/L (ref 96–106)
Creatinine, Ser: 0.97 mg/dL (ref 0.76–1.27)
Globulin, Total: 2 g/dL (ref 1.5–4.5)
Glucose: 91 mg/dL (ref 65–99)
Potassium: 3.8 mmol/L (ref 3.5–5.2)
Sodium: 141 mmol/L (ref 134–144)
Total Protein: 6.5 g/dL (ref 6.0–8.5)
eGFR: 98 mL/min/{1.73_m2} (ref >59)

## 2021-03-13 LAB — LIPID PANEL
Chol/HDL Ratio: 4.3 ratio (ref 0.0–5.0)
Cholesterol, Total: 228 mg/dL — ABNORMAL HIGH (ref 100–199)
HDL: 53 mg/dL (ref >39)
LDL Chol Calc (NIH): 146 mg/dL — ABNORMAL HIGH (ref 0–99)
Triglycerides: 164 mg/dL — ABNORMAL HIGH (ref 0–149)
VLDL Cholesterol Cal: 29 mg/dL (ref 5–40)

## 2021-03-13 LAB — THYROID PANEL WITH TSH
Free Thyroxine Index: 1.7 (ref 1.2–4.9)
T3 Uptake Ratio: 25 % (ref 24–39)
T4, Total: 6.9 ug/dL (ref 4.5–12.0)
TSH: 0.915 u[IU]/mL (ref 0.450–4.500)

## 2021-03-13 LAB — BAYER DCA HB A1C WAIVED: HB A1C (BAYER DCA - WAIVED): 5.3 % (ref <7.0)

## 2021-03-19 ENCOUNTER — Other Ambulatory Visit: Payer: Self-pay

## 2021-03-19 ENCOUNTER — Ambulatory Visit: Payer: Managed Care, Other (non HMO) | Admitting: Internal Medicine

## 2021-03-19 ENCOUNTER — Encounter: Payer: Self-pay | Admitting: Internal Medicine

## 2021-03-19 VITALS — BP 109/72 | HR 85 | Temp 98.3°F | Ht 69.02 in | Wt 206.6 lb

## 2021-03-19 DIAGNOSIS — F3342 Major depressive disorder, recurrent, in full remission: Secondary | ICD-10-CM

## 2021-03-19 DIAGNOSIS — G43009 Migraine without aura, not intractable, without status migrainosus: Secondary | ICD-10-CM

## 2021-03-19 MED ORDER — SUMATRIPTAN SUCCINATE 100 MG PO TABS: 100.0000 mg | ORAL_TABLET | Freq: Every day | ORAL | 1 refills | Status: DC | PRN

## 2021-03-19 NOTE — Progress Notes (Signed)
BP 109/72   Pulse 85   Temp 98.3 F (36.8 C) (Oral)   Ht 5' 9.02" (1.753 m)   Wt 206 lb 9.6 oz (93.7 kg)   SpO2 98%   BMI 30.50 kg/m    Subjective:    Patient ID: Lance Walsh, male    DOB: [birth year], 45 y.o.   MRN: [id number]  HPI: Lance Walsh is a 45 y.o. male  Depression        This is a chronic problem.  The current episode started 1 to 4 weeks ago.  Gastroesophageal Reflux  Migraine  This is a chronic problem. The problem has been gradually improving. There is no history of migraine headaches or migraines in the family.    Chief Complaint  Patient presents with  . Depression    Was prescribed Effexor at last visit, has not picked up and will not pick up, does not want to take antidepressants.  . Gastroesophageal Reflux  . Migraine  . Insomnia         Relevant past medical, surgical, family and social history reviewed and updated as indicated. Interim medical history since our last visit reviewed. Allergies and medications reviewed and updated.  Review of Systems  Psychiatric/Behavioral: Positive for depression.    Per HPI unless specifically indicated above     Objective:    BP 109/72   Pulse 85   Temp 98.3 F (36.8 C) (Oral)   Ht 5' 9.02" (1.753 m)   Wt 206 lb 9.6 oz (93.7 kg)   SpO2 98%   BMI 30.50 kg/m   Wt Readings from Last 3 Encounters:  03/19/21 206 lb 9.6 oz (93.7 kg)  02/18/21 208 lb 3.2 oz (94.4 kg)  12/09/20 203 lb 3.2 oz (92.2 kg)    Physical Exam Vitals and nursing note reviewed.  Constitutional:      General: He is not in acute distress.    Appearance: Normal appearance. He is not ill-appearing or diaphoretic.  HENT:     Head: Normocephalic and atraumatic.     Right Ear: Tympanic membrane and external ear normal. There is no impacted cerumen.     Left Ear: External ear normal.     Nose: No congestion or rhinorrhea.     Mouth/Throat:     Pharynx: No oropharyngeal exudate or posterior oropharyngeal erythema.  Eyes:      Conjunctiva/sclera: Conjunctivae normal.     Pupils: Pupils are equal, round, and reactive to light.  Cardiovascular:     Rate and Rhythm: Normal rate and regular rhythm.     Heart sounds: No murmur heard. No friction rub. No gallop.   Pulmonary:     Effort: No respiratory distress.     Breath sounds: No stridor. No wheezing or rhonchi.  Chest:     Chest wall: No tenderness.  Abdominal:     General: Abdomen is flat. Bowel sounds are normal.     Palpations: Abdomen is soft. There is no mass.     Tenderness: There is no abdominal tenderness.  Musculoskeletal:     Cervical back: Normal range of motion and neck supple. No rigidity or tenderness.     Left lower leg: No edema.  Skin:    General: Skin is warm and dry.  Neurological:     Mental Status: He is alert.     Results for orders placed or performed in visit on 03/12/21  Lipid panel  Result Value Ref Range   Cholesterol, Total 228 (  H) 100 - 199 mg/dL   Triglycerides 164 (H) 0 - 149 mg/dL   HDL 53 >39 mg/dL   VLDL Cholesterol Cal 29 5 - 40 mg/dL   LDL Chol Calc (NIH) 146 (H) 0 - 99 mg/dL   Chol/HDL Ratio 4.3 0.0 - 5.0 ratio  Bayer DCA Hb A1c Waived  Result Value Ref Range   HB A1C (BAYER DCA - WAIVED) 5.3 <7.0 %  Thyroid Panel With TSH  Result Value Ref Range   TSH 0.915 0.450 - 4.500 uIU/mL   T4, Total 6.9 4.5 - 12.0 ug/dL   T3 Uptake Ratio 25 24 - 39 %   Free Thyroxine Index 1.7 1.2 - 4.9  CBC with Differential/Platelet  Result Value Ref Range   WBC 7.7 3.4 - 10.8 x10E3/uL   RBC 5.51 4.14 - 5.80 x10E6/uL   Hemoglobin 16.3 13.0 - 17.7 g/dL   Hematocrit 47.8 37.5 - 51.0 %   MCV 87 79 - 97 fL   MCH 29.6 26.6 - 33.0 pg   MCHC 34.1 31.5 - 35.7 g/dL   RDW 12.2 11.6 - 15.4 %   Platelets 290 150 - 450 x10E3/uL   Neutrophils 56 Not Estab. %   Lymphs 31 Not Estab. %   Monocytes 8 Not Estab. %   Eos 4 Not Estab. %   Basos 1 Not Estab. %   Neutrophils Absolute 4.3 1.4 - 7.0 x10E3/uL   Lymphocytes Absolute 2.3 0.7 -  3.1 x10E3/uL   Monocytes Absolute 0.6 0.1 - 0.9 x10E3/uL   EOS (ABSOLUTE) 0.3 0.0 - 0.4 x10E3/uL   Basophils Absolute 0.1 0.0 - 0.2 x10E3/uL   Immature Granulocytes 0 Not Estab. %   Immature Grans (Abs) 0.0 0.0 - 0.1 x10E3/uL  Comprehensive metabolic panel  Result Value Ref Range   Glucose 91 65 - 99 mg/dL   BUN 21 6 - 24 mg/dL   Creatinine, Ser 0.97 0.76 - 1.27 mg/dL   eGFR 98 >59 mL/min/1.73   BUN/Creatinine Ratio 22 (H) 9 - 20   Sodium 141 134 - 144 mmol/L   Potassium 3.8 3.5 - 5.2 mmol/L   Chloride 101 96 - 106 mmol/L   CO2 21 20 - 29 mmol/L   Calcium 9.1 8.7 - 10.2 mg/dL   Total Protein 6.5 6.0 - 8.5 g/dL   Albumin 4.5 4.0 - 5.0 g/dL   Globulin, Total 2.0 1.5 - 4.5 g/dL   Albumin/Globulin Ratio 2.3 (H) 1.2 - 2.2   Bilirubin Total 0.4 0.0 - 1.2 mg/dL   Alkaline Phosphatase 56 44 - 121 IU/L   AST 27 0 - 40 IU/L   ALT 52 (H) 0 - 44 IU/L        Current Outpatient Medications:  .  meloxicam (MOBIC) 15 MG tablet, , Disp: , Rfl:  .  SUMAtriptan (IMITREX) 100 MG tablet, , Disp: , Rfl:  .  azelastine (OPTIVAR) 0.05 % ophthalmic solution, INSTILL 1 DROP IN AFFECTED EYES TWICE A DAY., Disp: 6 mL, Rfl: 12 .  Azelastine HCl 0.15 % SOLN, Place into both nostrils., Disp: , Rfl:  .  clonazePAM (KLONOPIN) 1 MG tablet, Take 1 tablet (1 mg total) by mouth daily., Disp: 15 tablet, Rfl: 0 .  clotrimazole-betamethasone (LOTRISONE) cream, Apply 1 application topically 2 (two) times daily., Disp: 45 g, Rfl: 1 .  diclofenac Sodium (VOLTAREN) 1 % GEL, Voltaren 1 % topical gel  APPLY 4 GRAMS TO THE AFFECTED AREA(S) BY TOPICAL ROUTE 4 TIMES PER DAY, Disp: , Rfl:  .  EPINEPHrine (EPIPEN IJ), Inject as directed as needed., Disp: , Rfl:  .  fluorometholone (FML) 0.1 % ophthalmic suspension, , Disp: , Rfl:  .  fluticasone (FLONASE) 50 MCG/ACT nasal spray, Place 2 sprays into both nostrils daily., Disp: 16 g, Rfl: 12 .  omeprazole (PRILOSEC) 20 MG capsule, Take 1 capsule (20 mg total) by mouth 2 (two)  times daily before a meal., Disp: 180 capsule, Rfl: 4 .  PREVIDENT 5000 SENSITIVE 1.1-5 % GEL, Place 1 application onto teeth 2 (two) times daily., Disp: , Rfl:  .  venlafaxine XR (EFFEXOR XR) 37.5 MG 24 hr capsule, Take 1 capsule (37.5 mg total) by mouth daily with breakfast., Disp: 30 capsule, Rfl: 1 .  VENTOLIN HFA 108 (90 Base) MCG/ACT inhaler, , Disp: , Rfl: 2    Assessment & Plan:  1. Fatigue  [place]. All bw negative.  2. Depression / anxiety :  .stable,  Is on effexor for such  To fu with psych  Has an appt to fu with them   3. HTG / HLD  recheck FLP, check LFT's work on diet, SE of meds explained to pt. low fat and high fiber diet explained to pt.  4. Headaches chronic - sees Dr. Manuella Ghazi for such  To fu with neurology for such    Problem List Items Addressed This Visit   None      Follow up plan: No follow-ups on file.

## 2021-03-30 ENCOUNTER — Telehealth: Payer: Self-pay

## 2021-04-16 ENCOUNTER — Telehealth: Payer: Self-pay

## 2021-04-16 ENCOUNTER — Telehealth: Payer: Self-pay | Admitting: Licensed Clinical Social Worker

## 2021-04-16 NOTE — Telephone Encounter (Signed)
    Clinical Social Work  Care Management   Phone Outreach    04/16/2021 Name: [name] MRN: [id number] DOB: 1976-06-07  Lance Walsh is a 45 y.o. year old male who is a primary care patient of Vigg, Avanti, MD .   CCM LCSW reached out to patient today by phone to introduce self, assess needs and offer Care Management services and interventions.    Telephone outreach was unsuccessful  Plan:CCM LCSW will wait for return call. If no return call is received, Will route chart to Care Guide to see if patient would like to reschedule phone appointment   Review of patient status, including review of consultants reports, relevant laboratory and other test results, and collaboration with appropriate care team members and the patient's provider was performed as part of comprehensive patient evaluation and provision of care management services.    Jenel Lucks, MSW, LCSW [facility] Care Management [place]  [email or web address] Phone [phone] 4:26 PM

## 2021-04-21 ENCOUNTER — Telehealth: Payer: Self-pay

## 2021-04-21 NOTE — Chronic Care Management (AMB) (Signed)
  Care Management   Note  04/21/2021 Name: Lance Walsh MRN: [id number] DOB: November 20, 1976  Lance Walsh is a 45 y.o. year old male who is a primary care patient of Vigg, Avanti, MD and is actively engaged with the care management team. I reached out to [name] by phone today to assist with re-scheduling a follow up visit with the Licensed Clinical Social Worker  Follow up plan: Unsuccessful telephone outreach attempt made. A HIPAA compliant phone message was left for the patient providing contact information and requesting a return call.  The care management team will reach out to the patient again over the next 7 days.  If patient returns call to provider office, please advise to call Embedded Care Management Care Guide Penne Lash  at [phone]  Penne Lash, RMA Care Guide, Embedded Care Coordination [facility]  [place], [place] [address] Direct Dial: [phone] [email or web address]: [email or web address]

## 2021-05-01 ENCOUNTER — Other Ambulatory Visit: Payer: Self-pay | Admitting: Neurology

## 2021-05-01 DIAGNOSIS — R519 Headache, unspecified: Secondary | ICD-10-CM

## 2021-05-07 ENCOUNTER — Ambulatory Visit
Admission: RE | Admit: 2021-05-07 | Discharge: 2021-05-07 | Disposition: A | Discharge status: Home / Self Care | Payer: 59 | Source: Ambulatory Visit | Attending: Neurology | Admitting: Neurology

## 2021-05-07 ENCOUNTER — Other Ambulatory Visit: Payer: Self-pay

## 2021-05-07 DIAGNOSIS — R519 Headache, unspecified: Secondary | ICD-10-CM | POA: Insufficient documentation

## 2021-05-07 MED ORDER — GADOBUTROL 1 MMOL/ML IV SOLN
9.0000 mL | Freq: Once | INTRAVENOUS | Status: AC | PRN
  Administered 2021-05-07: 9 mL via INTRAVENOUS

## 2021-05-07 NOTE — Chronic Care Management (AMB) (Signed)
  Care Management   Note  05/07/2021 Name: Lance Walsh MRN: [id number] DOB: March 07, 1976  Lance Walsh is a 45 y.o. year old male who is a primary care patient of Vigg, Avanti, MD and is actively engaged with the care management team. I reached out to [name] by phone today to assist with re-scheduling a follow up visit with the Licensed Clinical Social Worker  Follow up plan: Unsuccessful telephone outreach attempt made. A HIPAA compliant phone message was left for the patient providing contact information and requesting a return call.  The care management team will reach out to the patient again over the next 7 days.  If patient returns call to provider office, please advise to call Embedded Care Management Care Guide Penne Lash at [phone]  Penne Lash, RMA Care Guide, Embedded Care Coordination [facility]  [place], [place] [address] Direct Dial: [phone] [email or web address]: [email or web address]

## 2021-05-28 NOTE — Telephone Encounter (Signed)
3rd unsuccessful outreach  

## 2021-05-28 NOTE — Chronic Care Management (AMB) (Signed)
  Care Management   Note  05/28/2021 Name: Lance Walsh MRN: [id number] DOB: 04-06-1976  Lance Walsh is a 45 y.o. year old male who is a primary care patient of Vigg, Avanti, MD and is actively engaged with the care management team. I reached out to [name] by phone today to assist with re-scheduling a follow up visit with the Licensed Clinical Social Worker  Follow up plan: Unable to make contact on outreach attempts x 3. PCP Vigg, Avanti, MD notified via routed documentation in medical record.   Penne Lash, RMA Care Guide, Embedded Care Coordination [facility]  [place], [place] [address] Direct Dial: [phone] [email or web address]: [email or web address]

## 2021-06-22 ENCOUNTER — Encounter: Payer: Self-pay | Admitting: Otolaryngology

## 2021-06-30 ENCOUNTER — Ambulatory Visit
Admission: RE | Admit: 2021-06-30 | Discharge: 2021-06-30 | Disposition: A | Discharge status: Home / Self Care | Payer: 59 | Attending: Otolaryngology | Admitting: Otolaryngology

## 2021-06-30 ENCOUNTER — Encounter
Admission: RE | Disposition: A | Discharge status: Home / Self Care | Payer: Self-pay | Source: Home / Self Care | Attending: Otolaryngology

## 2021-06-30 ENCOUNTER — Encounter: Payer: Self-pay | Admitting: Otolaryngology

## 2021-06-30 ENCOUNTER — Ambulatory Visit: Payer: 59 | Admitting: Anesthesiology

## 2021-06-30 ENCOUNTER — Other Ambulatory Visit: Payer: Self-pay

## 2021-06-30 DIAGNOSIS — Z87891 Personal history of nicotine dependence: Secondary | ICD-10-CM | POA: Insufficient documentation

## 2021-06-30 DIAGNOSIS — J3489 Other specified disorders of nose and nasal sinuses: Secondary | ICD-10-CM | POA: Insufficient documentation

## 2021-06-30 DIAGNOSIS — Z825 Family history of asthma and other chronic lower respiratory diseases: Secondary | ICD-10-CM | POA: Diagnosis not present

## 2021-06-30 DIAGNOSIS — J342 Deviated nasal septum: Secondary | ICD-10-CM | POA: Diagnosis present

## 2021-06-30 DIAGNOSIS — Z8249 Family history of ischemic heart disease and other diseases of the circulatory system: Secondary | ICD-10-CM | POA: Diagnosis not present

## 2021-06-30 DIAGNOSIS — Z823 Family history of stroke: Secondary | ICD-10-CM | POA: Insufficient documentation

## 2021-06-30 DIAGNOSIS — Z833 Family history of diabetes mellitus: Secondary | ICD-10-CM | POA: Insufficient documentation

## 2021-06-30 DIAGNOSIS — J343 Hypertrophy of nasal turbinates: Secondary | ICD-10-CM | POA: Insufficient documentation

## 2021-06-30 DIAGNOSIS — Z79899 Other long term (current) drug therapy: Secondary | ICD-10-CM | POA: Diagnosis not present

## 2021-06-30 HISTORY — DX: Gastro-esophageal reflux disease without esophagitis: K21.9

## 2021-06-30 HISTORY — DX: Presence of spectacles and contact lenses: Z97.3

## 2021-06-30 HISTORY — DX: Headache, unspecified: R51.9

## 2021-06-30 HISTORY — PX: NASAL SEPTOPLASTY W/ TURBINOPLASTY: SHX2070

## 2021-06-30 HISTORY — DX: Personal history of urinary calculi: Z87.442

## 2021-06-30 SURGERY — SEPTOPLASTY, NOSE, WITH NASAL TURBINATE REDUCTION
Anesthesia: General | Site: Nose | Laterality: Bilateral

## 2021-06-30 MED ORDER — ACETAMINOPHEN 10 MG/ML IV SOLN
INTRAVENOUS | Status: DC | PRN
  Administered 2021-06-30: 1000 mg via INTRAVENOUS

## 2021-06-30 MED ORDER — HYDROCODONE-ACETAMINOPHEN 5-325 MG PO TABS: 1.0000 | ORAL_TABLET | Freq: Four times a day (QID) | ORAL | 0 refills | Status: DC | PRN

## 2021-06-30 MED ORDER — PROPOFOL 10 MG/ML IV BOLUS
INTRAVENOUS | Status: DC | PRN
  Administered 2021-06-30: 150 mg via INTRAVENOUS

## 2021-06-30 MED ORDER — ACETAMINOPHEN 10 MG/ML IV SOLN
1000.0000 mg | Freq: Once | INTRAVENOUS | Status: AC
  Administered 2021-06-30: 1000 mg via INTRAVENOUS

## 2021-06-30 MED ORDER — FENTANYL CITRATE (PF) 100 MCG/2ML IJ SOLN
INTRAMUSCULAR | Status: DC | PRN
  Administered 2021-06-30 (×2): 50 ug via INTRAVENOUS

## 2021-06-30 MED ORDER — ONDANSETRON HCL 4 MG/2ML IJ SOLN
INTRAMUSCULAR | Status: DC | PRN
  Administered 2021-06-30: 4 mg via INTRAVENOUS

## 2021-06-30 MED ORDER — OXYCODONE HCL 5 MG PO TABS
5.0000 mg | ORAL_TABLET | Freq: Once | ORAL | Status: AC | PRN
  Administered 2021-06-30: 5 mg via ORAL

## 2021-06-30 MED ORDER — LACTATED RINGERS IV SOLN: INTRAVENOUS | Status: DC

## 2021-06-30 MED ORDER — DEXAMETHASONE SODIUM PHOSPHATE 4 MG/ML IJ SOLN
INTRAMUSCULAR | Status: DC | PRN
  Administered 2021-06-30: 8 mg via INTRAVENOUS

## 2021-06-30 MED ORDER — DOXYCYCLINE HYCLATE 100 MG PO CAPS: ORAL_CAPSULE | ORAL | 0 refills | Status: DC

## 2021-06-30 MED ORDER — OXYMETAZOLINE HCL 0.05 % NA SOLN
NASAL | Status: DC | PRN
  Administered 2021-06-30: 1 via TOPICAL

## 2021-06-30 MED ORDER — LIDOCAINE-EPINEPHRINE 1 %-1:100000 IJ SOLN
INTRAMUSCULAR | Status: DC | PRN
  Administered 2021-06-30: 10 mL

## 2021-06-30 MED ORDER — OXYCODONE HCL 5 MG/5ML PO SOLN: 5.0000 mg | Freq: Once | ORAL | Status: AC | PRN

## 2021-06-30 MED ORDER — SUCCINYLCHOLINE CHLORIDE 200 MG/10ML IV SOSY
PREFILLED_SYRINGE | INTRAVENOUS | Status: DC | PRN
  Administered 2021-06-30: 120 mg via INTRAVENOUS

## 2021-06-30 MED ORDER — LIDOCAINE HCL (CARDIAC) PF 100 MG/5ML IV SOSY
PREFILLED_SYRINGE | INTRAVENOUS | Status: DC | PRN
  Administered 2021-06-30: 60 mg via INTRAVENOUS

## 2021-06-30 MED ORDER — FENTANYL CITRATE PF 50 MCG/ML IJ SOSY: 25.0000 ug | PREFILLED_SYRINGE | INTRAMUSCULAR | Status: DC | PRN

## 2021-06-30 MED ORDER — MIDAZOLAM HCL 5 MG/5ML IJ SOLN
INTRAMUSCULAR | Status: DC | PRN
  Administered 2021-06-30: 2 mg via INTRAVENOUS

## 2021-06-30 MED ORDER — GLYCOPYRROLATE 0.2 MG/ML IJ SOLN
INTRAMUSCULAR | Status: DC | PRN
Start: 2021-06-30 — End: 2021-06-30
  Administered 2021-06-30: .2 mg via INTRAVENOUS

## 2021-06-30 MED ORDER — DEXMEDETOMIDINE HCL 200 MCG/2ML IV SOLN
INTRAVENOUS | Status: DC | PRN
  Administered 2021-06-30: 5 ug via INTRAVENOUS
  Administered 2021-06-30: 10 ug via INTRAVENOUS
  Administered 2021-06-30: 5 ug via INTRAVENOUS

## 2021-06-30 SURGICAL SUPPLY — 20 items
CANISTER SUCT 1200ML W/VALVE (MISCELLANEOUS) ×2 IMPLANT
COAG SUCT 10F 3.5MM HAND CTRL (MISCELLANEOUS) ×2 IMPLANT
ELECT REM PT RETURN 9FT ADLT (ELECTROSURGICAL) ×2
ELECTRODE REM PT RTRN 9FT ADLT (ELECTROSURGICAL) ×1 IMPLANT
GLOVE SURG ENC MOIS LTX SZ7.5 (GLOVE) ×4 IMPLANT
GOWN STRL REUS W/ TWL LRG LVL3 (GOWN DISPOSABLE) ×1 IMPLANT
GOWN STRL REUS W/TWL LRG LVL3 (GOWN DISPOSABLE) ×2
KIT TURNOVER KIT A (KITS) ×2 IMPLANT
NEEDLE HYPO 25GX1X1/2 BEV (NEEDLE) ×2 IMPLANT
PACK ENT CUSTOM (PACKS) ×2 IMPLANT
PATTIES SURGICAL .5 X3 (DISPOSABLE) ×2 IMPLANT
SPLINT NASAL SEPTAL PRE-CUT (MISCELLANEOUS) ×2 IMPLANT
SUT CHROMIC 4 0 RB 1X27 (SUTURE) ×2 IMPLANT
SUT ETHILON 4-0 (SUTURE) ×2
SUT ETHILON 4-0 FS2 18XMFL BLK (SUTURE) ×1
SUT PLAIN GUT 4-0 (SUTURE) ×2 IMPLANT
SUTURE ETHLN 4-0 FS2 18XMF BLK (SUTURE) ×1 IMPLANT
SYR 10ML LL (SYRINGE) ×2 IMPLANT
TOWEL OR 17X26 4PK STRL BLUE (TOWEL DISPOSABLE) ×2 IMPLANT
WATER STERILE IRR 250ML POUR (IV SOLUTION) ×2 IMPLANT

## 2021-06-30 NOTE — Transfer of Care (Signed)
Immediate Anesthesia Transfer of Care Note  Patient: Lance Walsh  Procedure(s) Performed: NASAL SEPTOPLASTY WITH TURBINATE REDUCTION, SUBMUCOUS RESECTION (Bilateral: Nose)  Patient Location: PACU  Anesthesia Type: General  Level of Consciousness: awake, alert  and patient cooperative  Airway and Oxygen Therapy: Patient Spontanous Breathing and Patient connected to supplemental oxygen  Post-op Assessment: Post-op Vital signs reviewed, Patient's Cardiovascular Status Stable, Respiratory Function Stable, Patent Airway and No signs of Nausea or vomiting  Post-op Vital Signs: Reviewed and stable  Complications: No notable events documented.

## 2021-06-30 NOTE — H&P (Signed)
History and physical reviewed and will be scanned in later. No change in medical status reported by the patient or family, appears stable for surgery. All questions regarding the procedure answered, and patient (or family if a child) expressed understanding of the procedure. ? ?Lance Walsh ?@[email or web address]@ ?

## 2021-06-30 NOTE — Anesthesia Preprocedure Evaluation (Signed)
Anesthesia Evaluation  Patient identified by MRN, date of birth, ID band Patient awake    History of Anesthesia Complications Negative for: history of anesthetic complications  Airway Mallampati: II  TM Distance: >3 FB Neck ROM: Full    Dental no notable dental hx.    Pulmonary neg pulmonary ROS,    Pulmonary exam normal        Cardiovascular Exercise Tolerance: Good negative cardio ROS Normal cardiovascular exam     Neuro/Psych  Headaches, negative psych ROS   GI/Hepatic negative GI ROS, Neg liver ROS,   Endo/Other  negative endocrine ROS  Renal/GU negative Renal ROS     Musculoskeletal   Abdominal   Peds  Hematology negative hematology ROS (+)   Anesthesia Other Findings   Reproductive/Obstetrics                             Anesthesia Physical Anesthesia Plan  ASA: 2  Anesthesia Plan: General   Post-op Pain Management:    Induction: Intravenous  PONV Risk Score and Plan: 2 and Ondansetron, Midazolam and Treatment may vary due to age or medical condition  Airway Management Planned: Oral ETT  Additional Equipment:   Intra-op Plan:   Post-operative Plan: Extubation in OR  Informed Consent: I have reviewed the patients History and Physical, chart, labs and discussed the procedure including the risks, benefits and alternatives for the proposed anesthesia with the patient or authorized representative who has indicated his/her understanding and acceptance.     Dental advisory given  Plan Discussed with: CRNA  Anesthesia Plan Comments:         Anesthesia Quick Evaluation

## 2021-06-30 NOTE — Discharge Instructions (Signed)
[facility] ENDOSCOPIC SINUS SURGERY [facility]  What is Functional Endoscopic Sinus Surgery?  The Surgery involves making the natural openings of the sinuses larger by removing the bony partitions that separate the sinuses from the nasal cavity.  The natural sinus lining is preserved as much as possible to allow the sinuses to resume normal function after the surgery.  In some patients nasal polyps (excessively swollen lining of the sinuses) may be removed to relieve obstruction of the sinus openings.  The surgery is performed through the nose using lighted scopes, which eliminates the need for incisions on the face.  A septoplasty is a different procedure which is sometimes performed with sinus surgery.  It involves straightening the boy partition that separates the two sides of your nose.  A crooked or deviated septum may need repair if is obstructing the sinuses or nasal airflow.  Turbinate reduction is also often performed during sinus surgery.  The turbinates are bony proturberances from the side walls of the nose which swell and can obstruct the nose in patients with sinus and allergy problems.  Their size can be surgically reduced to help relieve nasal obstruction.  What Can Sinus Surgery Do For Me?  Sinus surgery can reduce the frequency of sinus infections requiring antibiotic treatment.  This can provide improvement in nasal congestion, post-nasal drainage, facial pressure and nasal obstruction.  Surgery will NOT prevent you from ever having an infection again, so it usually only for patients who get infections 4 or more times yearly requiring antibiotics, or for infections that do not clear with antibiotics.  It will not cure nasal allergies, so patients with allergies may still require medication to treat their allergies after surgery. Surgery may improve headaches related to sinusitis, however, some people will continue to  require medication to control sinus headaches related to allergies.  Surgery will do nothing for other forms of headache (migraine, tension or cluster).  What Are the Risks of Endoscopic Sinus Surgery?  Current techniques allow surgery to be performed safely with little risk, however, there are rare complications that patients should be aware of.  Because the sinuses are located around the eyes, there is risk of eye injury, including blindness, though again, this would be quite rare. This is usually a result of bleeding behind the eye during surgery, which puts the vision oat risk, though there are treatments to protect the vision and prevent permanent disrupted by surgery causing a leak of the spinal fluid that surrounds the brain.  More serious complications would include bleeding inside the brain cavity or damage to the brain.  Again, all of these complications are uncommon, and spinal fluid leaks can be safely managed surgically if they occur.  The most common complication of sinus surgery is bleeding from the nose, which may require packing or cauterization of the nose.  Continued sinus have polyps may experience recurrence of the polyps requiring revision surgery.  Alterations of sense of smell or injury to the tear ducts are also rare complications.   What is the Surgery Like, and what is the Recovery?  The Surgery usually takes a couple of hours to perform, and is usually performed under a general anesthetic (completely asleep).  Patients are usually discharged home after a couple of hours.  Sometimes during surgery it is necessary to pack the nose to control bleeding, and the packing is left in place for 24 - 48 hours, and removed by your surgeon.    If a septoplasty was performed during the procedure, there is often a splint placed which must be removed after 5-7 days.   Discomfort: Pain is usually mild to moderate, and can be controlled by prescription pain medication or acetaminophen (Tylenol).   Aspirin, Ibuprofen (Advil, Motrin), or Naprosyn (Aleve) should be avoided, as they can cause increased bleeding.  Most patients feel sinus pressure like they have a bad head cold for several days.  Sleeping with your head elevated can help reduce swelling and facial pressure, as can ice packs over the face.  A humidifier may be helpful to keep the mucous and blood from drying in the nose.   Diet: There are no specific diet restrictions, however, you should generally start with clear liquids and a light diet of bland foods because the anesthetic can cause some nausea.  Advance your diet depending on how your stomach feels.  Taking your pain medication with food will often help reduce stomach upset which pain medications can cause.  Nasal Saline Irrigation: It is important to remove blood clots and dried mucous from the nose as it is healing.  This is done by having you irrigate the nose at least 3 - 4 times daily with a salt water solution.  We recommend using NeilMed Sinus Rinse (available at the drug store).  Fill the squeeze bottle with the solution, bend over a sink, and insert the tip of the squeeze bottle into the nose  of an inch.  Point the tip of the squeeze bottle towards the inside corner of the eye on the same side your irrigating.  Squeeze the bottle and gently irrigate the nose.  If you bend forward as you do this, most of the fluid will flow back out of the nose, instead of down your throat.   The solution should be warm, near body temperature, when you irrigate.   Each time you irrigate, you should use a full squeeze bottle.   Note that if you are instructed to use Nasal Steroid Sprays at any time after your surgery, irrigate with saline BEFORE using the steroid spray, so you do not wash it all out of the nose. Another product, Nasal Saline Gel (such as AYR Nasal Saline Gel) can be applied in each nostril 3 - 4 times daily to moisture the nose and reduce scabbing or crusting.  Bleeding:   Bloody drainage from the nose can be expected for several days, and patients are instructed to irrigate their nose frequently with salt water to help remove mucous and blood clots.  The drainage may be dark red or brown, though some fresh blood may be seen intermittently, especially after irrigation.  Do not blow you nose, as bleeding may occur. If you must sneeze, keep your mouth open to allow air to escape through your mouth.  If heavy bleeding occurs: Irrigate the nose with saline to rinse out clots, then spray the nose 3 - 4 times with Afrin Nasal Decongestant Spray.  The spray will constrict the blood vessels to slow bleeding.  Pinch the lower half of your nose shut to apply pressure, and lay down with your head elevated.  Ice packs over the nose may help as well. If bleeding persists despite these measures, you should notify your doctor.  Do not use the Afrin routinely to control nasal congestion after surgery, as it can result in worsening congestion and may affect healing.     Activity: Return to work varies among patients. Most patients will be   out of work at least 5 - 7 days to recover.  Patient may return to work after they are off of narcotic pain medication, and feeling well enough to perform the functions of their job.  Patients must avoid heavy lifting (over 10 pounds) or strenuous physical for 2 weeks after surgery, so your employer may need to assign you to light duty, or keep you out of work longer if light duty is not possible.  NOTE: you should not drive, operate dangerous machinery, do any mentally demanding tasks or make any important legal or financial decisions while on narcotic pain medication and recovering from the general anesthetic.    Call Your Doctor Immediately if You Have Any of the Following: Bleeding that you cannot control with the above measures Loss of vision, double vision, bulging of the eye or black eyes. Fever over 101 degrees Neck stiffness with severe headache,  fever, nausea and change in mental state. You are always encourage to call anytime with concerns, however, please call with requests for pain medication refills during office hours.  Office Endoscopy: During follow-up visits your doctor will remove any packing or splints that may have been placed and evaluate and clean your sinuses endoscopically.  Topical anesthetic will be used to make this as comfortable as possible, though you may want to take your pain medication prior to the visit.  How often this will need to be done varies from patient to patient.  After complete recovery from the surgery, you may need follow-up endoscopy from time to time, particularly if there is concern of recurrent infection or nasal polyps.   

## 2021-06-30 NOTE — Anesthesia Postprocedure Evaluation (Signed)
Anesthesia Post Note  Patient: Lance Walsh  Procedure(s) Performed: NASAL SEPTOPLASTY WITH TURBINATE REDUCTION, SUBMUCOUS RESECTION (Bilateral: Nose)     Patient location during evaluation: PACU Anesthesia Type: General Level of consciousness: awake and alert Pain management: pain level controlled Vital Signs Assessment: post-procedure vital signs reviewed and stable Respiratory status: spontaneous breathing, nonlabored ventilation, respiratory function stable and patient connected to nasal cannula oxygen Cardiovascular status: blood pressure returned to baseline and stable Postop Assessment: no apparent nausea or vomiting Anesthetic complications: no   No notable events documented.  Wille Celeste Giani Winther

## 2021-06-30 NOTE — Anesthesia Procedure Notes (Signed)
Procedure Name: Intubation Date/Time: 06/30/2021 7:55 AM Performed by: Dionne Bucy, CRNA Pre-anesthesia Checklist: Patient identified, Patient being monitored, Timeout performed, Emergency Drugs available and Suction available Patient Re-evaluated:Patient Re-evaluated prior to induction Oxygen Delivery Method: Circle system utilized Preoxygenation: Pre-oxygenation with 100% oxygen Induction Type: IV induction Ventilation: Mask ventilation without difficulty Laryngoscope Size: Mac and 4 Grade View: Grade II Tube type: Oral Rae Tube size: 7.5 mm Number of attempts: 1 Placement Confirmation: ETT inserted through vocal cords under direct vision, positive ETCO2 and breath sounds checked- equal and bilateral Secured at: 21 cm Tube secured with: Tape Dental Injury: Teeth and Oropharynx as per pre-operative assessment

## 2021-06-30 NOTE — Op Note (Signed)
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 Hypertrophy of the inferior turbinates  Procedure: The patient was taken to the Operating Room and placed in the supine position.  After induction of general endotracheal anesthesia, the table was turned 90 degrees. A time-out was issued to confirm the site and procedure. The nasal septum and inferior turbinates where then injected with 1% lidocaine with epiniephrine, 1:100,000. The nose was decongested with Afrin soaked pledgets. The patient was then prepped and draped in the usual sterile fashion. Beginning on the left hand side a hemitransfixion incision was then created on the leading edge of the septum on the left.  A subperichondrial plane was elevated posteriorly on the left and taken back to the perpendicular plate of the ethmoid where subperiosteal plane was elevated posteriorly on the left. A large septal spur was identified on the right hand side.  An inferior rim of redundant septal cartilage was removed from where it deviated over the maxillary crest. The perpendicular plate of the ethmoid was separated from the quadrangular cartilage, and a subperiosteal plane elevated on the right of the bony septum. The large septal spur was removed, dividing the septal bone superiorly with Knight scissors, and inferiorly from the maxillary crest with a chisel.    The septum was then replaced in the midline. Reinspection through each nostril showed excellent reduction of the septal deformity. A right posterior inferior fenestration was then created to allow hematoma  drainage.  The septal incision was closed with 4-0 chromic gut suture. A 4-0 plain gut suture was used to reapproximate the septal flaps to the underlying cartilage, utilizing a running, quilting type stitch.   With the septoplasty completed, beginning on the left-hand side, a 15 blade was used to incise along the inferior edge of the inferior turbinate. A superior laterally based flap of the medial turbinate mucosa was then elevated. A portion of the underlying conchal bone and lateral mucosa was excised using Knight scissors. The flap was then laid back over the turbinate stump and the bleeding edge of the turbinate cauterized using suction cautery. In a similar fashion the submucous resection was performed on the right.  With the submucous resection completed bilaterally and no active bleeding, nasal septal splints were placed within each nostril and affixed to the septum using a 3-0 nylon suture.     The patient was then returned to the anesthesiologist for awakening, and was taken to the Recovery Room in stable condition.  Disposition:   PACU to home  Plan: Soft, bland diet and push fluids. Take pain medications and antibiotics as prescribed. No strenuous activity for 2 weeks. Follow-up in 3 weeks.  [name] 06/30/2021 8:51 AM

## 2021-07-01 ENCOUNTER — Encounter: Payer: Self-pay | Admitting: Otolaryngology

## 2021-07-02 ENCOUNTER — Other Ambulatory Visit: Payer: Self-pay | Admitting: Internal Medicine

## 2021-07-10 ENCOUNTER — Other Ambulatory Visit: Payer: Self-pay

## 2021-07-10 ENCOUNTER — Ambulatory Visit: Payer: 59 | Admitting: Podiatry

## 2021-07-10 DIAGNOSIS — M722 Plantar fascial fibromatosis: Secondary | ICD-10-CM | POA: Diagnosis not present

## 2021-07-10 DIAGNOSIS — L600 Ingrowing nail: Secondary | ICD-10-CM

## 2021-07-10 MED ORDER — GENTAMICIN SULFATE 0.1 % EX CREA: 1.0000 "application " | TOPICAL_CREAM | Freq: Three times a day (TID) | CUTANEOUS | 0 refills | Status: DC

## 2021-07-10 MED ORDER — BETAMETHASONE SOD PHOS & ACET 6 (3-3) MG/ML IJ SUSP
3.0000 mg | Freq: Once | INTRAMUSCULAR | Status: AC
  Administered 2021-07-10: 3 mg via INTRA_ARTICULAR

## 2021-07-10 NOTE — Progress Notes (Signed)
Subjective: Patient presenting today for follow-up evaluation of plantar fasciitis to the bilateral feet.  Chronic.  He has been dealing with the plantar fascial pain off and on for several years.  Patient states that the last injection helped significantly for few months.  He says that with the injection he gets about 80% better.  Patient also presents with a new complaint today regarding bilateral fifth toenail pain.  Patient presents today for evaluation of pain to the bilateral fifth toenail plates. Patient is concerned for possible ingrown nail.  He has a history of ingrown toenails before that have been corrected and he would like to have the fifth digit nail plates removed in their entirety permanently.  It is very sensitive to touch.  Patient presents today for further treatment and evaluation.  Past Medical History:  Diagnosis Date   Bipolar 1 disorder (HCC)    Calculus of kidney    Depression    Fatty liver    GERD (gastroesophageal reflux disease)    Headache    migraine and sinus   History of kidney stones    Plantar fasciitis    Wears contact lenses    sometimes    Objective:  General: Well developed, nourished, in no acute distress, alert and oriented x3   Dermatology: Skin is warm, dry and supple bilateral.  Medial and lateral nail folds around the fifth toe nail plate bilateral appears to be erythematous with evidence of an ingrowing nail. Pain on palpation noted to the border of the nail fold. The remaining nails appear unremarkable at this time. There are no open sores, lesions.  Vascular: Dorsalis Pedis artery and Posterior Tibial artery pedal pulses palpable. No lower extremity edema noted.   Neruologic: Grossly intact via light touch bilateral.  Musculoskeletal: Muscular strength within normal limits in all groups bilateral. Normal range of motion noted to all pedal and ankle joints.  Pain on palpation along the plantar fascia bilateral right greater than the  left  Assesement: #1 Paronychia with ingrowing nail fifth digits bilateral #2  Plantar fasciitis RT > LT  Plan of Care:  1. Patient evaluated.  2. Discussed treatment alternatives and plan of care. Explained nail avulsion procedure and post procedure course to patient. 3. Patient opted for permanent total nail avulsion of the ingrown portion of the nail.  4. Prior to procedure, local anesthesia infiltration utilized using 3 ml of a 50:50 mixture of 2% plain lidocaine and 0.5% plain marcaine in a normal hallux block fashion and a betadine prep performed.  5.  Total permanent nail avulsion with chemical matrixectomy performed using 3x30sec applications of phenol followed by alcohol flush.  6. Light dressing applied.  Post care instructions provided 7.  Prescription for gentamicin 2% cream  8.  Injection of 0.5 cc Celestone Soluspan injected into the plantar fascial right  9.  Patient declined oral medication.  He states that none of the oral medications have helped in the past to alleviate his symptoms  10.  Continue good supportive sneakers.  Patient has had custom orthotics in the past but he states that the orthotics caused his heel pain to increase  11.  Return to clinic 2 months.  At this time we may need to consider possible endoscopic plantar fasciotomy to the bilateral heels.  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 Felecia Shelling, DPM    [address] [place], [place] [address]                Office [phone]  Fax [phone]

## 2021-07-10 NOTE — Patient Instructions (Signed)

## 2021-09-11 ENCOUNTER — Ambulatory Visit: Payer: 59 | Admitting: Podiatry

## 2021-09-18 ENCOUNTER — Ambulatory Visit: Payer: 59 | Admitting: Podiatry

## 2021-09-18 ENCOUNTER — Other Ambulatory Visit: Payer: Self-pay

## 2021-09-18 DIAGNOSIS — M722 Plantar fascial fibromatosis: Secondary | ICD-10-CM | POA: Diagnosis not present

## 2021-10-04 MED ORDER — BETAMETHASONE SOD PHOS & ACET 6 (3-3) MG/ML IJ SUSP: 3.0000 mg | Freq: Once | INTRAMUSCULAR | Status: DC

## 2021-10-04 NOTE — Progress Notes (Signed)
   Subjective: 45 y.o. male presenting today for follow-up evaluation of plantar fasciitis of bilateral feet.  Chronic.  He has been dealing with a plantar fascial pain off and on for several years.  He states that the last injection helped significantly for a few months.  He has now had a flareup.  He presents for further treatment and evaluation   Past Medical History:  Diagnosis Date   Bipolar 1 disorder (HCC)    Calculus of kidney    Depression    Fatty liver    GERD (gastroesophageal reflux disease)    Headache    migraine and sinus   History of kidney stones    Plantar fasciitis    Wears contact lenses    sometimes     Objective: Physical Exam General: The patient is alert and oriented x3 in no acute distress.  Dermatology: Skin is warm, dry and supple bilateral lower extremities. Negative for open lesions or macerations bilateral.   Vascular: Dorsalis Pedis and Posterior Tibial pulses palpable bilateral.  Capillary fill time is immediate to all digits.  Neurological: Epicritic and protective threshold intact bilateral.   Musculoskeletal: Tenderness to palpation to the plantar aspect of the bilateral heels along the plantar fascia. All other joints range of motion within normal limits bilateral. Strength 5/5 in all groups bilateral.    Assessment: 1. plantar fasciitis bilateral feet  Plan of Care:  1. Patient evaluated.   2. Injection of 0.5cc Celestone soluspan injected into the bilateral heels.  3.  The patient would like to talk to his boss about surgery.  Today surgery was discussed since the patient has had history of plantar fasciitis intermittently for several years despite multiple conservative treatment modalities.  I explained to the patient that if we did pursue surgery he would need about 2 months off of work.  He stands on his feet all day.   4.  Return to clinic as needed.  If the patient decides to pursue surgery within the next month or 2 he can come  into the office to fill out the paperwork.  The surgical consultation was performed today.  All patient questions were answered.  No guarantees were expressed or implied.  All possible complications and details of the procedure were explained.  Felecia Shelling, DPM [facility]  Dr. Felecia Shelling, DPM    [address] [place], [place] [address]                Office [phone]  Fax [phone]

## 2021-10-25 ENCOUNTER — Ambulatory Visit (INDEPENDENT_AMBULATORY_CARE_PROVIDER_SITE_OTHER): Payer: Worker's Compensation

## 2021-10-25 ENCOUNTER — Ambulatory Visit
Admission: RE | Admit: 2021-10-25 | Discharge: 2021-10-25 | Disposition: A | Discharge status: Home / Self Care | Payer: Worker's Compensation | Source: Ambulatory Visit

## 2021-10-25 ENCOUNTER — Other Ambulatory Visit: Payer: Self-pay

## 2021-10-25 VITALS — BP 124/86 | HR 70 | Temp 98.4°F | Resp 16 | Ht 70.0 in | Wt 204.0 lb

## 2021-10-25 DIAGNOSIS — M25511 Pain in right shoulder: Secondary | ICD-10-CM

## 2021-10-25 DIAGNOSIS — S43401A Unspecified sprain of right shoulder joint, initial encounter: Secondary | ICD-10-CM | POA: Diagnosis not present

## 2021-10-25 NOTE — ED Triage Notes (Signed)
Patient states that he injured his right shoulder will swing and lifting a 15lb  over 2 weeks ago.  Patient reports ongoing pain in his right shoulder that has not improved.

## 2021-10-25 NOTE — ED Provider Notes (Signed)
[facility]    CSN: [id number] Arrival date & time: 10/25/21  1158      History   Chief Complaint Chief Complaint  Patient presents with   Appointment   Shoulder Pain   Worker's Comp    HPI Lance Walsh is a 45 y.o. male.   Pt reports previous injury when working in [employer] from swinging a hammer and bruising the muscle in his right upper arm and shoulder. States he was at work 2 weeks ago swinging a piece of equipment repetitively and felt like he bruised inside his same right shoulder, then went and grabbed a bucket at work recently and couldn't lift it, told his boss he hurt his shoulder, no evaluation until today. Pt states he has been using a TENS unit to right shoulder and arm to "get it going in the morning". Pt denies any fall or direct blunt force trauma.  The history is provided by the patient. No language interpreter was used.  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   Pain details:    Quality:  Aching   Radiates to:  Does not radiate  Past Medical History:  Diagnosis Date   Bipolar 1 disorder ([place])    Calculus of kidney    Depression    Fatty liver    GERD (gastroesophageal reflux disease)    Headache    migraine and sinus   History of kidney stones    Plantar fasciitis    Wears contact lenses    sometimes    Patient Active Problem List   Diagnosis Date Noted   Acute pain of right shoulder 10/25/2021   Sprain of shoulder, right 10/25/2021   Chronic back pain 12/09/2020   Gastroesophageal reflux disease 12/09/2020   Long-term current use of benzodiazepine 12/09/2020   Elevated low density lipoprotein (LDL) cholesterol level 12/06/2020   Allergic rhinitis 02/13/2019   Insomnia 11/13/2018   Migraine headache 12/29/2016   Depression 11/04/2015    Past Surgical History:  Procedure Laterality Date   NASAL  SEPTOPLASTY W/ TURBINOPLASTY Bilateral 06/30/2021   Procedure: NASAL SEPTOPLASTY WITH TURBINATE REDUCTION, SUBMUCOUS RESECTION;  Surgeon: Clyde Canterbury, MD;  Location: [facility];  Service: ENT;  Laterality: Bilateral;   [place] Medications    Prior to Admission medications   Medication Sig Start Date End Date Taking? Authorizing Provider  azelastine (OPTIVAR) 0.05 % ophthalmic solution INSTILL 1 DROP IN AFFECTED EYES TWICE A DAY. 02/08/17  Yes Johnson, Megan P, DO  clonazePAM (KLONOPIN) 1 MG tablet Take 1 tablet (1 mg total) by mouth daily. 02/04/21  Yes Vigg, Avanti, MD  fluorometholone (FML) 0.1 % ophthalmic suspension  03/06/20  Yes [provider]  omeprazole (PRILOSEC) 20 MG capsule Take 1 capsule (20 mg total) by mouth 2 (two) times daily before a meal. 12/09/20  Yes Cannady, Jolene T, NP  Azelastine HCl 0.15 % SOLN Place into both nostrils. 01/09/21   [provider]  doxycycline (VIBRAMYCIN) 100 MG capsule 100 mg PO BID x 10 days 06/30/21   Clyde Canterbury, MD  EPINEPHrine (EPIPEN IJ) Inject as directed as needed.    [provider]  Erenumab-aooe 140 MG/ML SOAJ Inject into the skin every 30 (thirty) days. Takes on the 12th    [provider]  fluticasone (FLONASE) 50 MCG/ACT nasal spray Place  2 sprays into both nostrils daily. 11/13/18   Steele Sizer, MD  gentamicin cream (GARAMYCIN) 0.1 % Apply 1 application topically 3 (three) times daily. 07/10/21   Felecia Shelling, DPM  HYDROcodone-acetaminophen (NORCO/VICODIN) 5-325 MG tablet Take 1-2 tablets by mouth every 6 (six) hours as needed for moderate pain. 06/30/21   Geanie Logan, MD  meloxicam (MOBIC) 15 MG tablet Take 15 mg by mouth daily. 10/08/21   [provider]  Multiple Vitamins-Minerals (CENTRUM SILVER PO) Take by mouth daily.    [provider]  PREVIDENT 5000 SENSITIVE 1.1-5 % GEL Place 1 application onto teeth 2 (two) times daily. 11/28/20    [provider]  SUMAtriptan (IMITREX) 100 MG tablet Take 1 tablet (100 mg total) by mouth daily as needed for migraine or headache. Maximum once a day only. Not to exceed 1 tab in 24 hrs. 07/02/21   Loura Pardon, MD  VENTOLIN HFA 108 (90 Base) MCG/ACT inhaler  07/26/16   [provider]    Family History Family History  Problem Relation Age of Onset   Alcohol abuse Mother    Diabetes Mother    Cancer Mother    Diabetes Sister    Cancer Sister     Social History Social History   Tobacco Use   Smoking status: Never   Smokeless tobacco: Never  Vaping Use   Vaping Use: Never used  Substance Use Topics   Alcohol use: Not Currently   Drug use: No     Allergies   Shellfish-derived products, Lac bovis, and Lactose intolerance (gi)   Review of Systems Review of Systems  Musculoskeletal:  Positive for arthralgias and myalgias.  Skin:  Negative for color change and wound.  All other systems reviewed and are negative.   Physical Exam Triage Vital Signs ED Triage Vitals  Enc Vitals Group     BP 10/25/21 1219 124/86     Pulse Rate 10/25/21 1219 70     Resp 10/25/21 1219 16     Temp 10/25/21 1219 98.4 F (36.9 C)     Temp Source 10/25/21 1219 Oral     SpO2 10/25/21 1219 99 %     Weight 10/25/21 1216 204 lb (92.5 kg)     Height 10/25/21 1216 5\' 10"  (1.778 m)     Head Circumference --      Peak Flow --      Pain Score 10/25/21 1216 3     Pain Loc --      Pain Edu? --      Excl. in GC? --    No data found.  Updated Vital Signs BP 124/86 (BP Location: Left Arm)   Pulse 70   Temp 98.4 F (36.9 C) (Oral)   Resp 16   Ht 5\' 10"  (1.778 m)   Wt 204 lb (92.5 kg)   SpO2 99%   BMI 29.27 kg/m   Visual Acuity Right Eye Distance:   Left Eye Distance:   Bilateral Distance:    Right Eye Near:   Left Eye Near:    Bilateral Near:     Physical Exam Vitals and nursing note reviewed.  Musculoskeletal:     Right shoulder: Tenderness present. No swelling,  deformity, effusion, laceration or crepitus. Normal range of motion. Normal strength. Normal pulse.       Arms:  Skin:    General: Skin is warm.     Capillary Refill: Capillary refill takes less than 2 seconds.  Findings: No wound.     Comments: No discoloration  Neurological:     General: No focal deficit present.     Mental Status: He is alert and oriented to person, place, and time.     GCS: GCS eye subscore is 4. GCS verbal subscore is 5. GCS motor subscore is 6.     Cranial Nerves: Cranial nerves 2-12 are intact.     Sensory: Sensation is intact.     Motor: Motor function is intact.     Coordination: Coordination is intact.     Gait: Gait is intact.  Psychiatric:        Attention and Perception: Attention normal.        Mood and Affect: Mood normal.        Speech: Speech normal.        Behavior: Behavior normal.     UC Treatments / Results  Labs (all labs ordered are listed, but only abnormal results are displayed) Labs Reviewed - No data to display  EKG   Radiology DG Shoulder Right  Result Date: 10/25/2021 CLINICAL DATA:  Right shoulder injury at work 2 weeks ago while lifting heavy object. Persistent right shoulder pain. EXAM: RIGHT SHOULDER - 2+ VIEW COMPARISON:  None. FINDINGS: There is no evidence of fracture or dislocation. There is no evidence of arthropathy or other focal bone abnormality. Soft tissues are unremarkable. IMPRESSION: Negative. Electronically Signed   By: Marlaine Hind M.D.   On: 10/25/2021 13:40    Procedures Procedures (including critical care time)  Medications Ordered in UC Medications - No data to display  Initial Impression / Assessment and Plan / UC Course  I have reviewed the triage vital signs and the nursing notes.  Pertinent labs & imaging results that were available during my care of the patient were reviewed by me and considered in my medical decision making (see chart for details).     Ddx: Right shoulder strain, right  shoulder pain, rotator cuff injury Final Clinical Impressions(s) / UC Diagnoses   Final diagnoses:  Acute pain of right shoulder  Sprain of right shoulder, unspecified shoulder sprain type, initial encounter     Discharge Instructions       Your xray was negative for acute findings.   Please call:  Ph # [phone] M-F 8-5 [place] to make an appt regarding concern for workman's Comp versus recurrent shoulder injury/pain. 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  Rest,ice, may take tylenol/ibuprofen as label directed for shoulder pain.      ED Prescriptions   None    PDMP not reviewed this encounter.   Tori Milks, NP [phone]

## 2021-10-25 NOTE — Discharge Instructions (Addendum)
  Your xray was negative for acute findings.   Please call:  Ph # [phone] M-F 8-5 1238 [facility] to make an appt regarding concern for workman's Comp versus recurrent shoulder injury/pain. 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  Rest,ice, may take tylenol/ibuprofen as label directed for shoulder pain.

## 2022-01-15 ENCOUNTER — Encounter: Payer: Self-pay | Admitting: Podiatry

## 2022-01-15 ENCOUNTER — Other Ambulatory Visit: Payer: Self-pay

## 2022-01-15 ENCOUNTER — Ambulatory Visit: Payer: 59 | Admitting: Podiatry

## 2022-01-15 DIAGNOSIS — M722 Plantar fascial fibromatosis: Secondary | ICD-10-CM

## 2022-01-15 MED ORDER — METHYLPREDNISOLONE 4 MG PO TBPK: ORAL_TABLET | ORAL | 0 refills | Status: DC

## 2022-01-15 NOTE — Progress Notes (Signed)
° °  Subjective: 46 y.o. male presenting today for an acute flare of chronic heel pain to the right heel specifically.  Patient has had chronic plantar fasciitis to the bilateral heels for few years now.  Most recently he had an exacerbation that began about a month ago.  He is requesting injection today.  He presents for further treatment and evaluation   Past Medical History:  Diagnosis Date   Bipolar 1 disorder (HCC)    Calculus of kidney    Depression    Fatty liver    GERD (gastroesophageal reflux disease)    Headache    migraine and sinus   History of kidney stones    Plantar fasciitis    Wears contact lenses    sometimes   Past Surgical History:  Procedure Laterality Date   NASAL SEPTOPLASTY W/ TURBINOPLASTY Bilateral 06/30/2021   Procedure: NASAL SEPTOPLASTY WITH TURBINATE REDUCTION, SUBMUCOUS RESECTION;  Surgeon: Geanie Logan, MD;  Location: [facility];  Service: ENT;  Laterality: Bilateral;   WISDOM TOOTH EXTRACTION     Allergies  Allergen Reactions   Shellfish-Derived Products Swelling    Throat Closing   Lac Bovis    Lactose Diarrhea    Diarrhea   Lactose Intolerance (Gi)     Diarrhea      Objective: Physical Exam General: The patient is alert and oriented x3 in no acute distress.  Dermatology: Skin is warm, dry and supple bilateral lower extremities. Negative for open lesions or macerations bilateral.   Vascular: Dorsalis Pedis and Posterior Tibial pulses palpable bilateral.  Capillary fill time is immediate to all digits.  Neurological: Epicritic and protective threshold intact bilateral.   Musculoskeletal: Tenderness to palpation to the plantar aspect of the bilateral heels along the plantar fascia. All other joints range of motion within normal limits bilateral. Strength 5/5 in all groups bilateral.    Assessment: 1. plantar fasciitis bilateral feet. RT > LT  Plan of Care:  1. Patient evaluated. Xrays reviewed.   2. Injection of 0.5cc  Celestone soluspan injected into the right heel 3. Rx for Medrol Dose Pak placed 4. Today we discussed the conservative versus surgical management of the presenting pathology.  The patient has now had heel pain he states for a few years now intermittently.  The patient opts for surgical management. All possible complications and details of the procedure were explained. All patient questions were answered. No guarantees were expressed or implied. 5. Authorization for surgery was initiated today. Surgery will consist of endoscopic plantar fasciotomy bilateral 6.  Return to clinic 1 week postop   Felecia Shelling, DPM [facility]  Dr. Felecia Shelling, DPM    [address] [place], [place] [address]                Office [phone]  Fax [phone]

## 2022-01-18 ENCOUNTER — Telehealth: Payer: Self-pay

## 2022-01-18 NOTE — Telephone Encounter (Signed)
Received surgery paperwork from the [place] office. Left a message for I[place] to call and schedule surgery with Dr. Logan Bores.

## 2022-01-25 DIAGNOSIS — M722 Plantar fascial fibromatosis: Secondary | ICD-10-CM | POA: Diagnosis not present

## 2022-01-25 MED ORDER — BETAMETHASONE SOD PHOS & ACET 6 (3-3) MG/ML IJ SUSP
3.0000 mg | Freq: Once | INTRAMUSCULAR | Status: AC
  Administered 2022-01-25: 3 mg via INTRA_ARTICULAR

## 2022-01-27 HISTORY — PX: REFRACTIVE SURGERY: SHX103

## 2022-05-21 ENCOUNTER — Ambulatory Visit: Payer: 59 | Admitting: Podiatry

## 2022-05-21 DIAGNOSIS — M722 Plantar fascial fibromatosis: Secondary | ICD-10-CM

## 2022-05-21 MED ORDER — BETAMETHASONE SOD PHOS & ACET 6 (3-3) MG/ML IJ SUSP
3.0000 mg | Freq: Once | INTRAMUSCULAR | Status: AC
  Administered 2022-05-21: 3 mg via INTRA_ARTICULAR

## 2022-05-25 ENCOUNTER — Ambulatory Visit: Payer: 59 | Admitting: Unknown Physician Specialty

## 2022-05-25 ENCOUNTER — Encounter: Payer: Self-pay | Admitting: Unknown Physician Specialty

## 2022-05-25 VITALS — BP 110/72 | HR 78 | Temp 98.7°F | Ht 70.0 in | Wt 197.4 lb

## 2022-05-25 DIAGNOSIS — R5382 Chronic fatigue, unspecified: Secondary | ICD-10-CM

## 2022-05-25 DIAGNOSIS — L308 Other specified dermatitis: Secondary | ICD-10-CM

## 2022-05-25 DIAGNOSIS — M2550 Pain in unspecified joint: Secondary | ICD-10-CM | POA: Diagnosis not present

## 2022-05-25 DIAGNOSIS — F419 Anxiety disorder, unspecified: Secondary | ICD-10-CM | POA: Diagnosis not present

## 2022-05-25 DIAGNOSIS — K219 Gastro-esophageal reflux disease without esophagitis: Secondary | ICD-10-CM

## 2022-05-25 DIAGNOSIS — L309 Dermatitis, unspecified: Secondary | ICD-10-CM | POA: Insufficient documentation

## 2022-05-25 DIAGNOSIS — G43009 Migraine without aura, not intractable, without status migrainosus: Secondary | ICD-10-CM | POA: Diagnosis not present

## 2022-05-25 MED ORDER — MOMETASONE FUROATE 0.1 % EX CREA: TOPICAL_CREAM | CUTANEOUS | 1 refills | Status: DC

## 2022-05-25 MED ORDER — SUMATRIPTAN SUCCINATE 100 MG PO TABS: 100.0000 mg | ORAL_TABLET | Freq: Every day | ORAL | 0 refills | Status: AC | PRN

## 2022-05-25 MED ORDER — OMEPRAZOLE 20 MG PO CPDR: 20.0000 mg | DELAYED_RELEASE_CAPSULE | Freq: Two times a day (BID) | ORAL | 4 refills | Status: DC

## 2022-05-25 MED ORDER — CLONAZEPAM 1 MG PO TABS: 1.0000 mg | ORAL_TABLET | Freq: Every day | ORAL | 0 refills | Status: DC

## 2022-05-25 NOTE — Assessment & Plan Note (Signed)
Stable.  Refill Omeprazole

## 2022-05-26 ENCOUNTER — Other Ambulatory Visit: Payer: 59

## 2022-05-27 ENCOUNTER — Other Ambulatory Visit: Payer: Self-pay

## 2022-05-27 DIAGNOSIS — M2550 Pain in unspecified joint: Secondary | ICD-10-CM

## 2022-05-27 DIAGNOSIS — R5382 Chronic fatigue, unspecified: Secondary | ICD-10-CM

## 2022-05-28 LAB — SPECIMEN STATUS REPORT

## 2022-05-31 ENCOUNTER — Telehealth: Payer: Self-pay

## 2022-05-31 ENCOUNTER — Ambulatory Visit: Payer: Self-pay

## 2022-05-31 MED ORDER — MELOXICAM 15 MG PO TABS
15.0000 mg | ORAL_TABLET | Freq: Every day | ORAL | 0 refills | Status: DC
Start: 2022-05-31 — End: 2022-06-11

## 2022-05-31 NOTE — Addendum Note (Signed)
Addended by: Dorcas Carrow on: 05/31/2022 02:08 PM   Modules accepted: Orders

## 2022-05-31 NOTE — Telephone Encounter (Signed)
  Chief Complaint: back, hip, leg pain (L>R) Symptoms: pain severe starting Sat. Night, pain constant, muscle spasms and numbness and tingling down  left leg and to  toes. Left leg sx worse than on right leg.  Frequency: Saturday night severe Pertinent Negatives: Patient denies bowel or bladder incontinence, fever,abdominal pain, burning with urination. Disposition: [] ED /[] Urgent Care (no appt availability in office) / [x] Appointment(In office/virtual)/ []  [facility]/ [] Home Care/ [] Refused Recommended Disposition /[] [place]/ []  Follow-up with PCP Additional Notes: Pt asking for pain medication. Pt stated ever since taken off Meloxicam, his hands and joints are swollen. Pt stated 10 years ago he was seen by orthopedic doctor who injected his back which provided comfort. Pt stated that he is not exactly sure what is wrong with his back. Pt given appt Wednesday in office. Please call pt, requesting pain medication to get him some comfort.   Reason for Disposition  [1] SEVERE back pain (e.g., excruciating, unable to do any normal activities) AND [2] not improved 2 hours after pain medicine  Answer Assessment - Initial Assessment Questions 1. ONSET: "When did the pain begin?"      Saturday night 2. LOCATION: "Where does it hurt?" (upper, mid or lower back)    Mid back to  hips to  3. SEVERITY: "How bad is the pain?"  (e.g., Scale 1-10; mild, moderate, or severe)   - MILD (1-3): doesn't interfere with normal activities    - MODERATE (4-7): interferes with normal activities or awakens from sleep    - SEVERE (8-10): excruciating pain, unable to do any normal activities      severe 4. PATTERN: "Is the pain constant?" (e.g., yes, no; constant, intermittent)      constant 5. RADIATION: "Does the pain shoot into your legs or elsewhere?"     hips 6. CAUSE:  "What do you think is causing the back pain?"      7. BACK OVERUSE:  "Any recent lifting of heavy objects, strenuous  work or exercise?"     no 8. MEDICATIONS: "What have you taken so far for the pain?" (e.g., nothing, acetaminophen, NSAIDS)     Wants meloxicam 9. NEUROLOGIC SYMPTOMS: "Do you have any weakness, numbness, or problems with bowel/bladder control?"     Numbness worse left than right 10. OTHER SYMPTOMS: "Do you have any other symptoms?" (e.g., fever, abdominal pain, burning with urination, blood in urine)       Left leg goes numb, tingling left leg to toes > right  Protocols used: Back Pain-A-AH

## 2022-05-31 NOTE — Telephone Encounter (Signed)
Patient came in office this morning stating he was taken off Meloxicam and is now experiencing cramping and pain due to not having the medication. Patient is requesting that a new prescription or a substitute be called in to [facility] as soon as possible. Please call patient to advise.

## 2022-06-01 LAB — COMPREHENSIVE METABOLIC PANEL
ALT: 40 IU/L (ref 0–44)
AST: 20 IU/L (ref 0–40)
Albumin/Globulin Ratio: 2 (ref 1.2–2.2)
Albumin: 4.5 g/dL (ref 4.0–5.0)
Alkaline Phosphatase: 55 IU/L (ref 44–121)
BUN/Creatinine Ratio: 14 (ref 9–20)
BUN: 13 mg/dL (ref 6–24)
Bilirubin Total: 0.6 mg/dL (ref 0.0–1.2)
CO2: 26 mmol/L (ref 20–29)
Calcium: 9.6 mg/dL (ref 8.7–10.2)
Chloride: 100 mmol/L (ref 96–106)
Creatinine, Ser: 0.95 mg/dL (ref 0.76–1.27)
Globulin, Total: 2.2 g/dL (ref 1.5–4.5)
Glucose: 97 mg/dL (ref 70–99)
Potassium: 4.7 mmol/L (ref 3.5–5.2)
Sodium: 139 mmol/L (ref 134–144)
Total Protein: 6.7 g/dL (ref 6.0–8.5)
eGFR: 100 mL/min/{1.73_m2} (ref >59)

## 2022-06-01 LAB — CBC WITH DIFFERENTIAL/PLATELET
Basophils Absolute: 0.1 10*3/uL (ref 0.0–0.2)
Basos: 1 %
EOS (ABSOLUTE): 0.2 10*3/uL (ref 0.0–0.4)
Eos: 4 %
Hematocrit: 47 % (ref 37.5–51.0)
Hemoglobin: 15.9 g/dL (ref 13.0–17.7)
Immature Grans (Abs): 0 10*3/uL (ref 0.0–0.1)
Immature Granulocytes: 1 %
Lymphocytes Absolute: 2 10*3/uL (ref 0.7–3.1)
Lymphs: 32 %
MCH: 29.9 pg (ref 26.6–33.0)
MCHC: 33.8 g/dL (ref 31.5–35.7)
MCV: 88 fL (ref 79–97)
Monocytes Absolute: 0.5 10*3/uL (ref 0.1–0.9)
Monocytes: 9 %
Neutrophils Absolute: 3.3 10*3/uL (ref 1.4–7.0)
Neutrophils: 53 %
Platelets: 278 10*3/uL (ref 150–450)
RBC: 5.32 x10E6/uL (ref 4.14–5.80)
RDW: 12.4 % (ref 11.6–15.4)
WBC: 6.1 10*3/uL (ref 3.4–10.8)

## 2022-06-01 LAB — RHEUMATOID FACTOR: Rheumatoid fact SerPl-aCnc: <10 IU/mL (ref <14.0)

## 2022-06-01 LAB — ANA: Anti Nuclear Antibody (ANA): NEGATIVE

## 2022-06-01 LAB — TSH: TSH: 1.03 u[IU]/mL (ref 0.450–4.500)

## 2022-06-01 LAB — TESTOSTERONE, FREE, TOTAL, SHBG
Sex Hormone Binding: 24.5 nmol/L (ref 16.5–55.9)
Testosterone, Free: 7.1 pg/mL (ref 6.8–21.5)
Testosterone: 418 ng/dL (ref 264–916)

## 2022-06-01 LAB — SEDIMENTATION RATE: Sed Rate: 3 mm/hr (ref 0–15)

## 2022-06-02 ENCOUNTER — Ambulatory Visit: Payer: 59 | Admitting: Unknown Physician Specialty

## 2022-06-02 ENCOUNTER — Encounter: Payer: Self-pay | Admitting: Unknown Physician Specialty

## 2022-06-02 VITALS — BP 114/79 | HR 75 | Temp 98.6°F | Ht 70.0 in | Wt 204.4 lb

## 2022-06-02 DIAGNOSIS — M62838 Other muscle spasm: Secondary | ICD-10-CM

## 2022-06-02 DIAGNOSIS — M5432 Sciatica, left side: Secondary | ICD-10-CM | POA: Diagnosis not present

## 2022-06-02 DIAGNOSIS — R5382 Chronic fatigue, unspecified: Secondary | ICD-10-CM | POA: Diagnosis not present

## 2022-06-02 DIAGNOSIS — M5431 Sciatica, right side: Secondary | ICD-10-CM

## 2022-06-02 MED ORDER — VITAMIN D (ERGOCALCIFEROL) 1.25 MG (50000 UNIT) PO CAPS: 50000.0000 [IU] | ORAL_CAPSULE | ORAL | 0 refills | Status: DC

## 2022-06-02 MED ORDER — CYCLOBENZAPRINE HCL 10 MG PO TABS: 10.0000 mg | ORAL_TABLET | Freq: Three times a day (TID) | ORAL | 0 refills | Status: DC | PRN

## 2022-06-02 MED ORDER — METHYLPREDNISOLONE 4 MG PO TBPK: ORAL_TABLET | ORAL | 0 refills | Status: DC

## 2022-06-02 NOTE — Patient Instructions (Addendum)
Start medrol dose pack before restarting Meloxicam  Then restart Meloxicam  You may start Flexeril- caution drowsiness  Take Magnesium supplement. This may help with spasms.  I prefer Magnesium Citrate.  Directions on bottle

## 2022-06-02 NOTE — Progress Notes (Signed)
BP 114/79   Pulse 75   Temp 98.6 F (37 C) (Oral)   Ht '5\' 10"'  (1.778 m)   Wt 204 lb 6.4 oz (92.7 kg)   SpO2 98%   BMI 29.33 kg/m    Subjective:    Patient ID: JAYKWON MORONES, male    DOB: 1976-10-26, 46 y.o.   MRN: [id number]  HPI: SANJAY BROADFOOT is a 46 y.o. male  Chief Complaint  Patient presents with   Back Pain    Started getting worse on Friday, would like to have a referral   Medication Problem    Recently had Meloxicam discontinues and is now having withdrawal symptoms.    Pt is here for problems of severe back pain starting suddenly 5 days ago.  He looked up on-line and read this can be related to suddenly stopping Meloxicam. He is having severe back and leg spasms from neck down to toes.    He would like a referral to Orthopedics as he used to get shots and Tramadol prn.  He states left leg will go out on him.    Pt is also concerned ongoing fatigue.  Last visit checked inflammatory markers and lab panel and was normal, including testosterone.    Also concerned about abdominal weight gain     05/25/2022    2:58 PM 03/19/2021    3:16 PM 02/18/2021    3:23 PM 02/04/2021    2:35 PM 12/09/2020    3:44 PM  Depression screen PHQ 2/9  Decreased Interest 0 0 3 3 0  Down, Depressed, Hopeless 0 0 1 2 0  PHQ - 2 Score 0 0 4 5 0  Altered sleeping 1 0 '3 2 3  ' Tired, decreased energy 3 0 '3 1 3  ' Change in appetite 1 0 0 0 0  Feeling bad or failure about yourself  0 0 0 0 0  Trouble concentrating 0 0 0 0 0  Moving slowly or fidgety/restless 0 0 0 0 0  Suicidal thoughts 0 0 0 0 0  PHQ-9 Score 5 0 '10 8 6  ' Difficult doing work/chores Very difficult  Somewhat difficult Not difficult at all      Relevant past medical, surgical, family and social history reviewed and updated as indicated. Interim medical history since our last visit reviewed. Allergies and medications reviewed and updated.  Review of Systems  Per HPI unless specifically indicated above     Objective:     BP 114/79   Pulse 75   Temp 98.6 F (37 C) (Oral)   Ht '5\' 10"'  (1.778 m)   Wt 204 lb 6.4 oz (92.7 kg)   SpO2 98%   BMI 29.33 kg/m   Wt Readings from Last 3 Encounters:  06/02/22 204 lb 6.4 oz (92.7 kg)  05/25/22 197 lb 6.4 oz (89.5 kg)  10/25/21 204 lb (92.5 kg)    Physical Exam Constitutional:      Appearance: He is well-developed.  HENT:     Head: Normocephalic.     Right Ear: Tympanic membrane, ear canal and external ear normal.     Left Ear: Tympanic membrane, ear canal and external ear normal.     Mouth/Throat:     Pharynx: Uvula midline.  Eyes:     Pupils: Pupils are equal, round, and reactive to light.  Cardiovascular:     Rate and Rhythm: Normal rate and regular rhythm.     Heart sounds: Normal heart sounds. No murmur heard.  No friction rub. No gallop.  Pulmonary:     Effort: Pulmonary effort is normal. No respiratory distress.     Breath sounds: Normal breath sounds.  Abdominal:     General: Bowel sounds are normal. There is no distension.     Palpations: Abdomen is soft.     Tenderness: There is no abdominal tenderness.  Musculoskeletal:        General: Normal range of motion.  Skin:    General: Skin is warm and dry.  Neurological:     Mental Status: He is alert and oriented to person, place, and time.     Deep Tendon Reflexes: Reflexes are normal and symmetric.  Psychiatric:        Behavior: Behavior normal.        Thought Content: Thought content normal.        Judgment: Judgment normal.     Results for orders placed or performed in visit on 05/27/22  TSH  Result Value Ref Range   TSH 1.030 0.450 - 4.500 uIU/mL  Testosterone, Free, Total, SHBG  Result Value Ref Range   Testosterone 418 264 - 916 ng/dL   Testosterone, Free 7.1 6.8 - 21.5 pg/mL   Sex Hormone Binding 24.5 16.5 - 55.9 nmol/L  CBC with Differential/Platelet  Result Value Ref Range   WBC 6.1 3.4 - 10.8 x10E3/uL   RBC 5.32 4.14 - 5.80 x10E6/uL   Hemoglobin 15.9 13.0 - 17.7 g/dL    Hematocrit 47.0 37.5 - 51.0 %   MCV 88 79 - 97 fL   MCH 29.9 26.6 - 33.0 pg   MCHC 33.8 31.5 - 35.7 g/dL   RDW 12.4 11.6 - 15.4 %   Platelets 278 150 - 450 x10E3/uL   Neutrophils 53 Not Estab. %   Lymphs 32 Not Estab. %   Monocytes 9 Not Estab. %   Eos 4 Not Estab. %   Basos 1 Not Estab. %   Neutrophils Absolute 3.3 1.4 - 7.0 x10E3/uL   Lymphocytes Absolute 2.0 0.7 - 3.1 x10E3/uL   Monocytes Absolute 0.5 0.1 - 0.9 x10E3/uL   EOS (ABSOLUTE) 0.2 0.0 - 0.4 x10E3/uL   Basophils Absolute 0.1 0.0 - 0.2 x10E3/uL   Immature Granulocytes 1 Not Estab. %   Immature Grans (Abs) 0.0 0.0 - 0.1 x10E3/uL  Comprehensive metabolic panel  Result Value Ref Range   Glucose 97 70 - 99 mg/dL   BUN 13 6 - 24 mg/dL   Creatinine, Ser 0.95 0.76 - 1.27 mg/dL   eGFR 100 >59 mL/min/1.73   BUN/Creatinine Ratio 14 9 - 20   Sodium 139 134 - 144 mmol/L   Potassium 4.7 3.5 - 5.2 mmol/L   Chloride 100 96 - 106 mmol/L   CO2 26 20 - 29 mmol/L   Calcium 9.6 8.7 - 10.2 mg/dL   Total Protein 6.7 6.0 - 8.5 g/dL   Albumin 4.5 4.0 - 5.0 g/dL   Globulin, Total 2.2 1.5 - 4.5 g/dL   Albumin/Globulin Ratio 2.0 1.2 - 2.2   Bilirubin Total 0.6 0.0 - 1.2 mg/dL   Alkaline Phosphatase 55 44 - 121 IU/L   AST 20 0 - 40 IU/L   ALT 40 0 - 44 IU/L  Rheumatoid factor  Result Value Ref Range   Rhuematoid fact SerPl-aCnc <10.0 <14.0 IU/mL  ANA  Result Value Ref Range   Anti Nuclear Antibody (ANA) Negative Negative  Sedimentation rate  Result Value Ref Range   Sed Rate 3 0 - 15 mm/hr  Specimen status report  Result Value Ref Range   specimen status report Comment       Assessment & Plan:   Problem List Items Addressed This Visit   None Visit Diagnoses     Bilateral sciatica    -  Primary   Refer to Orthopedics per pt request.  [facility] before for injections and tramadol   Relevant Medications   cyclobenzaprine (FLEXERIL) 10 MG tablet   Other Relevant Orders    Ambulatory referral to Orthopedic Surgery   Vitamin B12   Tissue transglutaminase, IgA   Muscle spasms of both lower extremities       Check B12, Vitamin D and Celiac.    Start muscle relaxant and medrol dose pack.  Restart Meloxicam after medrol dose pack   Relevant Orders   Vitamin B12   Tissue transglutaminase, IgA   Chronic fatigue       Snores plus doesn't sleep well.  Order home sleep study.     Relevant Orders   Home sleep test        Follow up plan: Return in about 2 weeks (around 06/16/2022).

## 2022-06-04 LAB — VITAMIN B12: Vitamin B-12: 523 pg/mL (ref 232–1245)

## 2022-06-04 LAB — TISSUE TRANSGLUTAMINASE, IGA: Transglutaminase IgA: <2 U/mL (ref 0–3)

## 2022-06-11 ENCOUNTER — Ambulatory Visit: Payer: 59 | Admitting: Urology

## 2022-06-11 ENCOUNTER — Encounter: Payer: Self-pay | Admitting: Urology

## 2022-06-11 VITALS — BP 121/77 | HR 76 | Ht 70.0 in | Wt 202.0 lb

## 2022-06-11 DIAGNOSIS — N4889 Other specified disorders of penis: Secondary | ICD-10-CM | POA: Diagnosis not present

## 2022-06-11 DIAGNOSIS — N411 Chronic prostatitis: Secondary | ICD-10-CM | POA: Diagnosis not present

## 2022-06-11 DIAGNOSIS — N281 Cyst of kidney, acquired: Secondary | ICD-10-CM

## 2022-06-11 DIAGNOSIS — Z87442 Personal history of urinary calculi: Secondary | ICD-10-CM

## 2022-06-11 LAB — MICROSCOPIC EXAMINATION: RBC, Urine: NONE SEEN /hpf (ref 0–2)

## 2022-06-11 LAB — URINALYSIS, COMPLETE
Bilirubin, UA: NEGATIVE
Glucose, UA: NEGATIVE
Leukocytes,UA: NEGATIVE
Nitrite, UA: NEGATIVE
Protein,UA: NEGATIVE
RBC, UA: NEGATIVE
Specific Gravity, UA: 1.015 (ref 1.005–1.030)
Urobilinogen, Ur: 0.2 mg/dL (ref 0.2–1.0)
pH, UA: 8.5 — ABNORMAL HIGH (ref 5.0–7.5)

## 2022-06-11 MED ORDER — TAMSULOSIN HCL 0.4 MG PO CAPS: 0.4000 mg | ORAL_CAPSULE | Freq: Every day | ORAL | 0 refills | Status: DC

## 2022-06-12 ENCOUNTER — Encounter: Payer: Self-pay | Admitting: Unknown Physician Specialty

## 2022-06-12 ENCOUNTER — Encounter: Payer: Self-pay | Admitting: Urology

## 2022-06-12 NOTE — Progress Notes (Signed)
06/11/2022 4:00 PM   Lance Walsh 12-Feb-1976 [id number]  Referring provider: Loura Pardon, MD [address] [place],  [place] [address]  Chief Complaint  Patient presents with   Other    HPI: Lance Walsh is a 46 y.o. male self-referred for follow-up of a complex renal cyst.  Diagnosed with a Bosniak 2 right renal cyst 09/2012.  Last CT was September 2014 which showed a 3 x 6 x 2.2 cm hypodense lesion in the upper pole of the right kidney with a calcified rim.  No enhancement was noted and was classified as a Bosniak 32F renal cyst.  Last imaging was a renal ultrasound October 2018 which showed a rim calcified 3 cm right upper pole renal lesion.  He had also complained of intermittent penile retraction and his exam was normal.  He saw Dr. Guy Sandifer December 2018 for the penile pain/retraction and no abnormalities were identified.  It was felt this was most likely muscle pain in the abdominal wall.  Suspensory ligament release was discussed but was felt this would probably cause more problems.  He still complains of intermittent penile pain/retraction which has not significantly changed He also notes occasional discomfort at the end of urination associated with urinary hesitancy and decreased stream.  The symptoms are intermittent and the discomfort will last 2-3 days after it occurs He has a prior history of stone disease and requested a prescription for pain medication should he have renal colic   PMH: Past Medical History:  Diagnosis Date   Bipolar 1 disorder (HCC)    Calculus of kidney    Depression    Fatty liver    GERD (gastroesophageal reflux disease)    Headache    migraine and sinus   History of kidney stones    Plantar fasciitis    Wears contact lenses    sometimes    Surgical History: Past Surgical History:  Procedure Laterality Date   NASAL SEPTOPLASTY W/ TURBINOPLASTY Bilateral 06/30/2021   Procedure: NASAL SEPTOPLASTY WITH TURBINATE REDUCTION, SUBMUCOUS  RESECTION;  Surgeon: Geanie Logan, MD;  Location: [facility];  Service: ENT;  Laterality: Bilateral;   WISDOM TOOTH EXTRACTION      Home Medications:  Allergies as of 06/11/2022       Reactions   Shellfish-derived Products Swelling   Throat Closing   Lactose Diarrhea   Diarrhea   Lactose Intolerance (gi)    Diarrhea   Milk (cow)         Medication List        Accurate as of June 11, 2022 11:59 PM. If you have any questions, ask your nurse or doctor.          STOP taking these medications    meloxicam 15 MG tablet Commonly known as: MOBIC Stopped by: Riki Altes, MD       TAKE these medications    azelastine 0.05 % ophthalmic solution Commonly known as: OPTIVAR INSTILL 1 DROP IN AFFECTED EYES TWICE A DAY.   Azelastine HCl 0.15 % Soln Place into both nostrils.   CENTRUM SILVER PO Take by mouth daily.   clonazePAM 1 MG tablet Commonly known as: KLONOPIN Take 1 tablet (1 mg total) by mouth daily.   cyclobenzaprine 10 MG tablet Commonly known as: FLEXERIL Take 1 tablet (10 mg total) by mouth 3 (three) times daily as needed for muscle spasms.   EPIPEN IJ Inject as directed as needed.   Erenumab-aooe 140 MG/ML Soaj Inject into the skin  every 30 (thirty) days. Takes on the 12th   fluorometholone 0.1 % ophthalmic suspension Commonly known as: FML   fluticasone 50 MCG/ACT nasal spray Commonly known as: FLONASE Place 2 sprays into both nostrils daily.   gentamicin cream 0.1 % Commonly known as: GARAMYCIN Apply 1 application topically 3 (three) times daily.   HYDROcodone-acetaminophen 5-325 MG tablet Commonly known as: NORCO/VICODIN Take 1-2 tablets by mouth every 6 (six) hours as needed for moderate pain.   methylPREDNISolone 4 MG Tbpk tablet Commonly known as: MEDROL DOSEPAK As directed on package   mometasone 0.1 % cream Commonly known as: ELOCON Apply twice a day prn   omeprazole 20 MG capsule Commonly known as:  PRILOSEC Take 1 capsule (20 mg total) by mouth 2 (two) times daily before a meal.   PreviDent 5000 Sensitive 1.1-5 % Gel Generic drug: Sod Fluoride-Potassium Nitrate Place 1 application onto teeth 2 (two) times daily.   SUMAtriptan 100 MG tablet Commonly known as: IMITREX Take 1 tablet (100 mg total) by mouth daily as needed for migraine or headache. Maximum once a day only. Not to exceed 1 tab in 24 hrs.   tamsulosin 0.4 MG Caps capsule Commonly known as: FLOMAX Take 1 capsule (0.4 mg total) by mouth daily. Started by: Riki Altes, MD   Ventolin HFA 108 (90 Base) MCG/ACT inhaler Generic drug: albuterol   Vitamin D (Ergocalciferol) 1.25 MG (50000 UNIT) Caps capsule Commonly known as: DRISDOL Take 1 capsule (50,000 Units total) by mouth every 7 (seven) days.        Allergies:  Allergies  Allergen Reactions   Shellfish-Derived Products Swelling    Throat Closing   Lactose Diarrhea    Diarrhea   Lactose Intolerance (Gi)     Diarrhea    Milk (Cow)     Family History: Family History  Problem Relation Age of Onset   Alcohol abuse Mother    Diabetes Mother    Cancer Mother    Diabetes Sister    Cancer Sister     Social History:  reports that he has never smoked. He has never used smokeless tobacco. He reports that he does not currently use alcohol. He reports that he does not use drugs.   Physical Exam: BP 121/77   Pulse 76   Ht 5\' 10"  (1.778 m)   Wt 202 lb (91.6 kg)   BMI 28.98 kg/m   Constitutional:  Alert and oriented, No acute distress. HEENT: [place] AT Respiratory: Normal respiratory effort, no increased work of breathing. Psychiatric: Normal mood and affect.  Laboratory Data:  Urinalysis Microscopy negative   Assessment & Plan:    1.  Bosniak 68F right renal cyst Last imaging 2018 Will schedule a follow-up CT to assess for cyst ability and evaluation of nephrolithiasis  2.  Lower urinary tract symptoms Associated with burning pain at the end  of urination which occurs intermittently and last 2-3 days Discussed this is most likely inflammatory prostatitis. Trial of tamsulosin 0.4 mg daily  3.  Penile pain/retraction Previously evaluated by Dr. 2019 and felt to be muscular pain of the.  NSAIDs were recommended  4.  Personal history urinary calculi Evaluation for nephrolithiasis as CT above Discussed that I could not prescribe narcotic pain medication for the possibility of renal colic.  If he does have calculi present a prescription for ketorolac could be sent   Guy Sandifer, MD  [facility] [address], [address] [place], [place] [phone]

## 2022-06-21 ENCOUNTER — Ambulatory Visit
Admission: RE | Admit: 2022-06-21 | Discharge: 2022-06-21 | Disposition: A | Discharge status: Home / Self Care | Payer: 59 | Source: Ambulatory Visit | Attending: Urology | Admitting: Urology

## 2022-06-21 DIAGNOSIS — N281 Cyst of kidney, acquired: Secondary | ICD-10-CM | POA: Insufficient documentation

## 2022-06-21 DIAGNOSIS — Z87442 Personal history of urinary calculi: Secondary | ICD-10-CM | POA: Insufficient documentation

## 2022-06-22 ENCOUNTER — Telehealth: Payer: Self-pay | Admitting: *Deleted

## 2022-06-22 NOTE — Telephone Encounter (Signed)
Notified patient as instructed, patient pleased °

## 2022-06-22 NOTE — Telephone Encounter (Signed)
-----   Message from Riki Altes, MD sent at 06/22/2022  2:54 PM EDT ----- CT showed no evidence of stones.  The right renal cyst is stable and no follow-up imaging is recommended

## 2022-06-25 ENCOUNTER — Telehealth: Payer: Self-pay

## 2022-06-25 NOTE — Telephone Encounter (Signed)
Left a voice message for the patient to call the office to come by the [place] location to sign his surgery authorization that he left on the counter and did not sign on 05/21/22, the paperwork is at the front desk with Joni Reining and she is also aware that he is to come by to sign.

## 2022-06-29 ENCOUNTER — Telehealth: Payer: Self-pay

## 2022-06-29 NOTE — Telephone Encounter (Signed)
Spoke with patient today and he states that he will swing by the office to sign this week after work before closing.

## 2022-07-02 ENCOUNTER — Other Ambulatory Visit: Payer: Self-pay | Admitting: Nurse Practitioner

## 2022-07-02 NOTE — Telephone Encounter (Signed)
Requested medication (s) are due for refill today:   Requested medication (s) are on the active medication list: no  Last refill:  03/29/22  Future visit scheduled: no  Notes to clinic:  rx isn't listed on chart and historical as well. Please advise     Requested Prescriptions  Pending Prescriptions Disp Refills   meloxicam (MOBIC) 15 MG tablet [Pharmacy Med Name: MELOXICAM 15 MG TABLET] 30 tablet 0    Sig: Take 1 tablet (15 mg total) by mouth daily.     Analgesics:  COX2 Inhibitors Failed - 07/02/2022  9:01 AM      Failed - Manual Review: Labs are only required if the patient has taken medication for more than 8 weeks.      Passed - HGB in normal range and within 360 days    Hemoglobin  Date Value Ref Range Status  05/27/2022 15.9 13.0 - 17.7 g/dL Final         Passed - Cr in normal range and within 360 days    Creatinine  Date Value Ref Range Status  12/09/2020 143.5 20.0 - 300.0 mg/dL Final  10/14/2012 1.11 0.60 - 1.30 mg/dL Final   Creatinine, Ser  Date Value Ref Range Status  05/27/2022 0.95 0.76 - 1.27 mg/dL Final         Passed - HCT in normal range and within 360 days    Hematocrit  Date Value Ref Range Status  05/27/2022 47.0 37.5 - 51.0 % Final         Passed - AST in normal range and within 360 days    AST  Date Value Ref Range Status  05/27/2022 20 0 - 40 IU/L Final   SGOT(AST)  Date Value Ref Range Status  04/22/2012 42 (H) 15 - 37 Unit/L Final         Passed - ALT in normal range and within 360 days    ALT  Date Value Ref Range Status  05/27/2022 40 0 - 44 IU/L Final   SGPT (ALT)  Date Value Ref Range Status  04/22/2012 81 (H) U/L Final    Comment:    12-78 NOTE: NEW REFERENCE RANGE 10/22/2011          Passed - eGFR is 30 or above and within 360 days    EGFR (African American)  Date Value Ref Range Status  10/14/2012 >60  Final   GFR calc Af Amer  Date Value Ref Range Status  12/09/2020 96 >59 mL/min/1.73 Final    Comment:     **In accordance with recommendations from the NKF-ASN Task force,**   [facility] is in the process of updating its eGFR calculation to the   2021 CKD-EPI creatinine equation that estimates kidney function   without a race variable.    EGFR (Non-African Amer.)  Date Value Ref Range Status  10/14/2012 >60  Final    Comment:    eGFR values <24m/min/1.73 m2 may be an indication of chronic kidney disease (CKD). Calculated eGFR is useful in patients with stable renal function. The eGFR calculation will not be reliable in acutely ill patients when serum creatinine is changing rapidly. It is not useful in  patients on dialysis. The eGFR calculation may not be applicable to patients at the low and high extremes of body sizes, pregnant women, and vegetarians.    GFR calc non Af Amer  Date Value Ref Range Status  12/09/2020 83 >59 mL/min/1.73 Final   eGFR  Date Value Ref Range  Status  05/27/2022 100 >59 mL/min/1.73 Final         Passed - Patient is not pregnant      Passed - Valid encounter within last 12 months    Recent Outpatient Visits           1 month ago Bilateral sciatica   [facility] Kathrine Haddock, NP   1 month ago Chronic fatigue   [facility] [place], Malachy Mood, NP   1 year ago Migraine without aura and without status migrainosus, not intractable   [facility] Vigg, Avanti, MD   1 year ago Chronic nonintractable headache, unspecified headache type   [facility] Vigg, Avanti, MD   1 year ago Anxiety   [facility] Vigg, Loman [place], MD

## 2022-07-22 ENCOUNTER — Ambulatory Visit: Payer: 59 | Admitting: Unknown Physician Specialty

## 2022-07-22 ENCOUNTER — Encounter: Payer: Self-pay | Admitting: Unknown Physician Specialty

## 2022-07-22 VITALS — BP 111/74 | HR 88 | Temp 99.4°F | Ht 70.0 in | Wt 207.1 lb

## 2022-07-22 DIAGNOSIS — Z1211 Encounter for screening for malignant neoplasm of colon: Secondary | ICD-10-CM

## 2022-07-22 DIAGNOSIS — E7800 Pure hypercholesterolemia, unspecified: Secondary | ICD-10-CM | POA: Diagnosis not present

## 2022-07-22 DIAGNOSIS — G4733 Obstructive sleep apnea (adult) (pediatric): Secondary | ICD-10-CM | POA: Diagnosis not present

## 2022-07-22 DIAGNOSIS — Z125 Encounter for screening for malignant neoplasm of prostate: Secondary | ICD-10-CM

## 2022-07-22 DIAGNOSIS — Z1159 Encounter for screening for other viral diseases: Secondary | ICD-10-CM

## 2022-07-22 DIAGNOSIS — R635 Abnormal weight gain: Secondary | ICD-10-CM

## 2022-07-22 DIAGNOSIS — G4730 Sleep apnea, unspecified: Secondary | ICD-10-CM | POA: Insufficient documentation

## 2022-07-22 NOTE — Assessment & Plan Note (Signed)
On CPAP.  Tolerating well.

## 2022-07-22 NOTE — Progress Notes (Signed)
BP 111/74   Pulse 88   Temp 99.4 F (37.4 C) (Oral)   Ht 5\' 10"  (1.778 m)   Wt 207 lb 1.6 oz (93.9 kg)   SpO2 96%   BMI 29.72 kg/m    Subjective:    Patient ID: Lance Walsh, male    DOB: 10-Jul-1976, 46 y.o.   MRN: [id number]  HPI: Lance Walsh is a 46 y.o. male  Chief Complaint  Patient presents with   Obstructive Sleep Apnea    Here to follow up on CPAP   Referral    Wants referral for colonoscopy   CPAP - Pt states he started a CPAP about 30 days ago.  Hard to get used to for the first 2 weeks.  Wife could tell a difference.  Last week he noticed a difference with decreased fatigue and didn't take a nap when he got home.  She is happy that she didn't snore.  He states he is still tired but willing to wait about 3 months.    Pt sent an e-mail earlier and wanted a cholesterol level checked.  Hx of colon cancer and needs a referral for another colonoscopy.    Social History   Socioeconomic History   Marital status: Married    Spouse name: Not on file   Number of children: Not on file   Years of education: Not on file   Highest education level: Not on file  Occupational History   Not on file  Tobacco Use   Smoking status: Never   Smokeless tobacco: Never  Vaping Use   Vaping Use: Never used  Substance and Sexual Activity   Alcohol use: Not Currently   Drug use: No   Sexual activity: Not on file  Other Topics Concern   Not on file  Social History Narrative   Not on file   Social Determinants of Health   Financial Resource Strain: Not on file  Food Insecurity: Not on file  Transportation Needs: Not on file  Physical Activity: Not on file  Stress: Not on file  Social Connections: Not on file  Intimate Partner Violence: Not on file   Family History  Problem Relation Age of Onset   Alcohol abuse Mother    Diabetes Mother    Cancer Mother    Diabetes Sister    Cancer Sister    Past Medical History:  Diagnosis Date   Bipolar 1 disorder (HCC)     Calculus of kidney    Depression    Fatty liver    GERD (gastroesophageal reflux disease)    Headache    migraine and sinus   History of kidney stones    Plantar fasciitis    Wears contact lenses    sometimes    Relevant past medical, surgical, family and social history reviewed and updated as indicated. Interim medical history since our last visit reviewed. Allergies and medications reviewed and updated.  Review of Systems  Constitutional: Negative.        Still bothered by abdominal weight gain  Respiratory: Negative.    Cardiovascular: Negative.   Skin: Negative.   Psychiatric/Behavioral: Negative.      Per HPI unless specifically indicated above     Objective:    BP 111/74   Pulse 88   Temp 99.4 F (37.4 C) (Oral)   Ht 5\' 10"  (1.778 m)   Wt 207 lb 1.6 oz (93.9 kg)   SpO2 96%   BMI 29.72 kg/m  Wt Readings from Last 3 Encounters:  07/22/22 207 lb 1.6 oz (93.9 kg)  06/11/22 202 lb (91.6 kg)  06/02/22 204 lb 6.4 oz (92.7 kg)    Physical Exam Constitutional:      General: He is not in acute distress.    Appearance: Normal appearance. He is well-developed.  HENT:     Head: Normocephalic and atraumatic.  Eyes:     General: Lids are normal. No scleral icterus.       Right eye: No discharge.        Left eye: No discharge.     Conjunctiva/sclera: Conjunctivae normal.  Neck:     Vascular: No carotid bruit or JVD.  Cardiovascular:     Rate and Rhythm: Normal rate and regular rhythm.     Heart sounds: Normal heart sounds.  Pulmonary:     Effort: Pulmonary effort is normal. No respiratory distress.     Breath sounds: Normal breath sounds.  Abdominal:     Palpations: There is no hepatomegaly or splenomegaly.  Musculoskeletal:        General: Normal range of motion.     Cervical back: Normal range of motion and neck supple.  Skin:    General: Skin is warm and dry.     Coloration: Skin is not pale.     Findings: No rash.  Neurological:     Mental Status: He  is alert and oriented to person, place, and time.  Psychiatric:        Behavior: Behavior normal.        Thought Content: Thought content normal.        Judgment: Judgment normal.     Results for orders placed or performed in visit on 06/11/22  Microscopic Examination   Urine  Result Value Ref Range   WBC, UA 0-5 0 - 5 /hpf   RBC, Urine None seen 0 - 2 /hpf   Epithelial Cells (non renal) 0-10 0 - 10 /hpf   Casts Present (A) None seen /lpf   Cast Type Granular casts (A) N/A   Crystals Present (A) N/A   Crystal Type Amorphous Sediment N/A   Bacteria, UA Few None seen/Few  Urinalysis, Complete  Result Value Ref Range   Specific Gravity, UA 1.015 1.005 - 1.030   pH, UA 8.5 (H) 5.0 - 7.5   Color, UA Yellow Yellow   Appearance Ur Cloudy (A) Clear   Leukocytes,UA Negative Negative   Protein,UA Negative Negative/Trace   Glucose, UA Negative Negative   Ketones, UA Trace (A) Negative   RBC, UA Negative Negative   Bilirubin, UA Negative Negative   Urobilinogen, Ur 0.2 0.2 - 1.0 mg/dL   Nitrite, UA Negative Negative   Microscopic Examination See below:       Assessment & Plan:   Problem List Items Addressed This Visit       Unprioritized   Elevated low density lipoprotein (LDL) cholesterol level    Check LDL today      Relevant Orders   Lipid Panel w/o Chol/HDL Ratio   Sleep apnea    On CPAP.  Tolerating well.        Other Visit Diagnoses     Prostate cancer screening    -  Primary   Relevant Orders   PSA   Colon cancer screening       Relevant Orders   Ambulatory referral to Gastroenterology   CT VIRTUAL COLONOSCOPY DIAGNOSTIC   Need for hepatitis C screening test  Relevant Orders   Hepatitis C antibody   Weight gain       abdominal weight gain probably associated with normal aging process as pt endorses a good diet and exercise. Hopefully alleviating sleep apnea will help        Follow up plan: Return in about 6 months (around  01/22/2023).

## 2022-07-22 NOTE — Assessment & Plan Note (Signed)
Check LDL today

## 2022-07-23 LAB — LIPID PANEL W/O CHOL/HDL RATIO
Cholesterol, Total: 241 mg/dL — ABNORMAL HIGH (ref 100–199)
HDL: 50 mg/dL (ref >39)
LDL Chol Calc (NIH): 141 mg/dL — ABNORMAL HIGH (ref 0–99)
Triglycerides: 278 mg/dL — ABNORMAL HIGH (ref 0–149)
VLDL Cholesterol Cal: 50 mg/dL — ABNORMAL HIGH (ref 5–40)

## 2022-07-23 LAB — HEPATITIS C ANTIBODY: Hep C Virus Ab: NONREACTIVE

## 2022-07-23 LAB — PSA: Prostate Specific Ag, Serum: 0.4 ng/mL (ref 0.0–4.0)

## 2022-07-26 ENCOUNTER — Telehealth: Payer: Self-pay

## 2022-07-26 ENCOUNTER — Other Ambulatory Visit: Payer: Self-pay

## 2022-07-26 DIAGNOSIS — Z1211 Encounter for screening for malignant neoplasm of colon: Secondary | ICD-10-CM

## 2022-07-26 MED ORDER — NA SULFATE-K SULFATE-MG SULF 17.5-3.13-1.6 GM/177ML PO SOLN: 1.0000 | Freq: Once | ORAL | 0 refills | Status: AC

## 2022-07-26 NOTE — Telephone Encounter (Signed)
Gastroenterology Pre-Procedure Review  Request Date: 09/17/22 Requesting Physician: Dr. Servando Snare  PATIENT REVIEW QUESTIONS: The patient responded to the following health history questions as indicated:    1. Are you having any GI issues? no 2. Do you have a personal history of Polyps? no 3. Do you have a family history of Colon Cancer or Polyps? yes (maternal grandfather colon cancer, and uncles colon cancer) 4. Diabetes Mellitus? no 5. Joint replacements in the past 12 months?no 6. Major health problems in the past 3 months?no 7. Any artificial heart valves, MVP, or defibrillator?no    MEDICATIONS & ALLERGIES:    Patient reports the following regarding taking any anticoagulation/antiplatelet therapy:   Plavix, Coumadin, Eliquis, Xarelto, Lovenox, Pradaxa, Brilinta, or Effient? no Aspirin? no  Patient confirms/reports the following medications:  Current Outpatient Medications  Medication Sig Dispense Refill   azelastine (OPTIVAR) 0.05 % ophthalmic solution INSTILL 1 DROP IN AFFECTED EYES TWICE A DAY. 6 mL 12   Azelastine HCl 0.15 % SOLN Place into both nostrils.     clonazePAM (KLONOPIN) 1 MG tablet Take 1 tablet (1 mg total) by mouth daily. 15 tablet 0   cyclobenzaprine (FLEXERIL) 10 MG tablet Take 1 tablet (10 mg total) by mouth 3 (three) times daily as needed for muscle spasms. 30 tablet 0   EPINEPHrine (EPIPEN IJ) Inject as directed as needed.     Erenumab-aooe 140 MG/ML SOAJ Inject into the skin every 30 (thirty) days. Takes on the 12th     fluorometholone (FML) 0.1 % ophthalmic suspension      fluticasone (FLONASE) 50 MCG/ACT nasal spray Place 2 sprays into both nostrils daily. 16 g 12   gentamicin cream (GARAMYCIN) 0.1 % Apply 1 application topically 3 (three) times daily. 15 g 0   HYDROcodone-acetaminophen (NORCO/VICODIN) 5-325 MG tablet Take 1-2 tablets by mouth every 6 (six) hours as needed for moderate pain. 40 tablet 0   mometasone (ELOCON) 0.1 % cream Apply twice a day prn  15 g 1   Multiple Vitamins-Minerals (CENTRUM SILVER PO) Take by mouth daily.     omeprazole (PRILOSEC) 20 MG capsule Take 1 capsule (20 mg total) by mouth 2 (two) times daily before a meal. 180 capsule 4   PREVIDENT 5000 SENSITIVE 1.1-5 % GEL Place 1 application onto teeth 2 (two) times daily.     SUMAtriptan (IMITREX) 100 MG tablet Take 1 tablet (100 mg total) by mouth daily as needed for migraine or headache. Maximum once a day only. Not to exceed 1 tab in 24 hrs. 30 tablet 0   tamsulosin (FLOMAX) 0.4 MG CAPS capsule Take 1 capsule (0.4 mg total) by mouth daily. 30 capsule 0   VENTOLIN HFA 108 (90 Base) MCG/ACT inhaler   2   Vitamin D, Ergocalciferol, (DRISDOL) 1.25 MG (50000 UNIT) CAPS capsule Take 1 capsule (50,000 Units total) by mouth every 7 (seven) days. 12 capsule 0   Current Facility-Administered Medications  Medication Dose Route Frequency Provider Last Rate Last Admin   betamethasone acetate-betamethasone sodium phosphate (CELESTONE) injection 3 mg  3 mg Intra-articular Once Felecia Shelling, DPM        Patient confirms/reports the following allergies:  Allergies  Allergen Reactions   Shellfish-Derived Products Swelling    Throat Closing   Lactose Diarrhea    Diarrhea   Lactose Intolerance (Gi)     Diarrhea    Milk (Cow)     No orders of the defined types were placed in this encounter.   AUTHORIZATION INFORMATION Primary  Insurance: 1D#: Group #:  Secondary Insurance: 1D#: Group #:  SCHEDULE INFORMATION: Date: 09/17/22 Time: Location: [facility]

## 2022-08-24 ENCOUNTER — Other Ambulatory Visit (HOSPITAL_COMMUNITY): Payer: Self-pay | Admitting: Physical Medicine and Rehabilitation

## 2022-08-24 ENCOUNTER — Other Ambulatory Visit: Payer: Self-pay | Admitting: Physical Medicine and Rehabilitation

## 2022-08-24 DIAGNOSIS — M5459 Other low back pain: Secondary | ICD-10-CM

## 2022-08-30 ENCOUNTER — Other Ambulatory Visit: Payer: Self-pay | Admitting: Family Medicine

## 2022-08-31 NOTE — Telephone Encounter (Signed)
Requested medication (s) are due for refill today: review  Requested medication (s) are on the active medication list: no  Last refill:  05/31/22  Future visit scheduled: no  Notes to clinic:  rx was dc'd on 06/11/22 by CMA. Please review for refill     Requested Prescriptions  Pending Prescriptions Disp Refills   meloxicam (MOBIC) 15 MG tablet [Pharmacy Med Name: MELOXICAM 15 MG TABLET] 30 tablet 0    Sig: Take 1 tablet (15 mg total) by mouth daily.     Analgesics:  COX2 Inhibitors Failed - 08/30/2022  3:02 PM      Failed - Manual Review: Labs are only required if the patient has taken medication for more than 8 weeks.      Passed - HGB in normal range and within 360 days    Hemoglobin  Date Value Ref Range Status  05/27/2022 15.9 13.0 - 17.7 g/dL Final         Passed - Cr in normal range and within 360 days    Creatinine  Date Value Ref Range Status  12/09/2020 143.5 20.0 - 300.0 mg/dL Final  10/14/2012 1.11 0.60 - 1.30 mg/dL Final   Creatinine, Ser  Date Value Ref Range Status  05/27/2022 0.95 0.76 - 1.27 mg/dL Final         Passed - HCT in normal range and within 360 days    Hematocrit  Date Value Ref Range Status  05/27/2022 47.0 37.5 - 51.0 % Final         Passed - AST in normal range and within 360 days    AST  Date Value Ref Range Status  05/27/2022 20 0 - 40 IU/L Final   SGOT(AST)  Date Value Ref Range Status  04/22/2012 42 (H) 15 - 37 Unit/L Final         Passed - ALT in normal range and within 360 days    ALT  Date Value Ref Range Status  05/27/2022 40 0 - 44 IU/L Final   SGPT (ALT)  Date Value Ref Range Status  04/22/2012 81 (H) U/L Final    Comment:    12-78 NOTE: NEW REFERENCE RANGE 10/22/2011          Passed - eGFR is 30 or above and within 360 days    EGFR (African American)  Date Value Ref Range Status  10/14/2012 >60  Final   GFR calc Af Amer  Date Value Ref Range Status  12/09/2020 96 >59 mL/min/1.73 Final    Comment:     **In accordance with recommendations from the NKF-ASN Task force,**   [facility] is in the process of updating its eGFR calculation to the   2021 CKD-EPI creatinine equation that estimates kidney function   without a race variable.    EGFR (Non-African Amer.)  Date Value Ref Range Status  10/14/2012 >60  Final    Comment:    eGFR values <78m/min/1.73 m2 may be an indication of chronic kidney disease (CKD). Calculated eGFR is useful in patients with stable renal function. The eGFR calculation will not be reliable in acutely ill patients when serum creatinine is changing rapidly. It is not useful in  patients on dialysis. The eGFR calculation may not be applicable to patients at the low and high extremes of body sizes, pregnant women, and vegetarians.    GFR calc non Af Amer  Date Value Ref Range Status  12/09/2020 83 >59 mL/min/1.73 Final   eGFR  Date Value Ref Range  Status  05/27/2022 100 >59 mL/min/1.73 Final         Passed - Patient is not pregnant      Passed - Valid encounter within last 12 months    Recent Outpatient Visits           1 month ago Prostate cancer screening   [facility] Kathrine Haddock, NP   3 months ago Bilateral sciatica   [facility] Kathrine Haddock, NP   3 months ago Chronic fatigue   [facility] [place], Malachy Mood, NP   1 year ago Migraine without aura and without status migrainosus, not intractable   [facility] Vigg, Avanti, MD   1 year ago Chronic nonintractable headache, unspecified headache type   [facility] Charlynne Cousins, MD

## 2022-09-08 ENCOUNTER — Ambulatory Visit
Admission: RE | Admit: 2022-09-08 | Discharge: 2022-09-08 | Disposition: A | Discharge status: Home / Self Care | Payer: 59 | Source: Ambulatory Visit | Attending: Physical Medicine and Rehabilitation | Admitting: Physical Medicine and Rehabilitation

## 2022-09-08 DIAGNOSIS — M5459 Other low back pain: Secondary | ICD-10-CM | POA: Insufficient documentation

## 2022-09-10 ENCOUNTER — Encounter: Payer: Self-pay | Admitting: Gastroenterology

## 2022-09-17 ENCOUNTER — Encounter
Admission: RE | Disposition: A | Discharge status: Home / Self Care | Payer: Self-pay | Source: Home / Self Care | Attending: Gastroenterology

## 2022-09-17 ENCOUNTER — Ambulatory Visit: Payer: 59 | Admitting: Anesthesiology

## 2022-09-17 ENCOUNTER — Encounter: Payer: Self-pay | Admitting: Gastroenterology

## 2022-09-17 ENCOUNTER — Other Ambulatory Visit: Payer: Self-pay

## 2022-09-17 ENCOUNTER — Ambulatory Visit
Admission: RE | Admit: 2022-09-17 | Discharge: 2022-09-17 | Disposition: A | Discharge status: Home / Self Care | Payer: 59 | Attending: Gastroenterology | Admitting: Gastroenterology

## 2022-09-17 DIAGNOSIS — K640 First degree hemorrhoids: Secondary | ICD-10-CM | POA: Insufficient documentation

## 2022-09-17 DIAGNOSIS — K219 Gastro-esophageal reflux disease without esophagitis: Secondary | ICD-10-CM | POA: Diagnosis not present

## 2022-09-17 DIAGNOSIS — Z1211 Encounter for screening for malignant neoplasm of colon: Secondary | ICD-10-CM

## 2022-09-17 DIAGNOSIS — R519 Headache, unspecified: Secondary | ICD-10-CM | POA: Diagnosis not present

## 2022-09-17 HISTORY — DX: Sleep apnea, unspecified: G47.30

## 2022-09-17 HISTORY — PX: COLONOSCOPY WITH PROPOFOL: SHX5780

## 2022-09-17 SURGERY — COLONOSCOPY WITH PROPOFOL
Anesthesia: General | Site: Rectum

## 2022-09-17 MED ORDER — LIDOCAINE HCL (CARDIAC) PF 100 MG/5ML IV SOSY
PREFILLED_SYRINGE | INTRAVENOUS | Status: DC | PRN
  Administered 2022-09-17: 100 mg via INTRAVENOUS

## 2022-09-17 MED ORDER — STERILE WATER FOR IRRIGATION IR SOLN
Status: DC | PRN
  Administered 2022-09-17: 150 mL

## 2022-09-17 MED ORDER — LACTATED RINGERS IV SOLN: INTRAVENOUS | Status: DC

## 2022-09-17 MED ORDER — PROPOFOL 10 MG/ML IV BOLUS
INTRAVENOUS | Status: DC | PRN
  Administered 2022-09-17: 50 mg via INTRAVENOUS
  Administered 2022-09-17: 200 ug/kg/min via INTRAVENOUS
  Administered 2022-09-17: 50 mg via INTRAVENOUS

## 2022-09-17 MED ORDER — SODIUM CHLORIDE 0.9 % IV SOLN: INTRAVENOUS | Status: DC

## 2022-09-17 SURGICAL SUPPLY — 21 items

## 2022-09-17 NOTE — Op Note (Signed)
[facility] Patient Name: Lance Walsh Procedure Date: 09/17/2022 10:05 AM MRN: [id number] Account #: [id number] Date of Birth: November 10, 1976 Admit Type: Outpatient Age: 46 Room: [facility] OR ROOM 01 Gender: Male Note Status: Finalized Instrument Name: [id number] Procedure:             Colonoscopy Indications:           Screening for colorectal malignant neoplasm Providers:             Lucilla Lame MD, MD Referring MD:          Charlynne Cousins (Referring MD) Medicines:             Propofol per Anesthesia Complications:         No immediate complications. Procedure:             Pre-Anesthesia Assessment:                        - Prior to the procedure, a History and Physical was                         performed, and patient medications and allergies were                         reviewed. The patient's tolerance of previous                         anesthesia was also reviewed. The risks and benefits                         of the procedure and the sedation options and risks                         were discussed with the patient. All questions were                         answered, and informed consent was obtained. Prior                         Anticoagulants: The patient has taken no previous                         anticoagulant or antiplatelet agents. ASA Grade                         Assessment: II - A patient with mild systemic disease.                         After reviewing the risks and benefits, the patient                         was deemed in satisfactory condition to undergo the                         procedure.                        After obtaining informed consent, the colonoscope was  passed under direct vision. Throughout the procedure,                         the patient's blood pressure, pulse, and oxygen                         saturations were monitored continuously. The                         Colonoscope was  introduced through the anus and                         advanced to the the cecum, identified by appendiceal                         orifice and ileocecal valve. The colonoscopy was                         performed without difficulty. The patient tolerated                         the procedure well. The quality of the bowel                         preparation was excellent. Findings:      The perianal and digital rectal examinations were normal.      Non-bleeding internal hemorrhoids were found. The hemorrhoids were Grade       I (internal hemorrhoids that do not prolapse).      The exam was otherwise without abnormality. Impression:            - Non-bleeding internal hemorrhoids.                        - The examination was otherwise normal.                        - No specimens collected. Recommendation:        - Discharge patient to home.                        - Resume previous diet.                        - Continue present medications.                        - Repeat colonoscopy in 10 years for screening                         purposes. Procedure Code(s):     --- Professional ---                        [phone], Colonoscopy, flexible; diagnostic, including                         collection of specimen(s) by brushing or washing, when                         performed (separate procedure) Diagnosis Code(s):     ---  Professional ---                        Z12.11, Encounter for screening for malignant neoplasm                         of colon CPT copyright 2019 American Medical Association. All rights reserved. The codes documented in this report are preliminary and upon coder review may  be revised to meet current compliance requirements. Lucilla Lame MD, MD 09/17/2022 10:32:47 AM This report has been signed electronically. Number of Addenda: 0 Note Initiated On: 09/17/2022 10:05 AM Scope Withdrawal Time: 0 hours 9 minutes 44 seconds  Total Procedure Duration: 0 hours 12 minutes  8 seconds  Estimated Blood Loss:  Estimated blood loss: none.      [facility]

## 2022-09-17 NOTE — H&P (Signed)
Lucilla Lame, MD [address] [place],  [address] Phone: [phone] Fax : [phone]  Primary Care Physician:  Charlynne Cousins, MD (Inactive) Primary Gastroenterologist:  Dr. Allen Norris  Pre-Procedure History & Physical: HPI:  Lance Walsh is a 45 y.o. male is here for a screening colonoscopy.   Past Medical History:  Diagnosis Date   Bipolar 1 disorder ([place])    Calculus of kidney    Depression    Fatty liver    GERD (gastroesophageal reflux disease)    Headache    migraine and sinus   History of kidney stones    Plantar fasciitis    Sleep apnea    uses CPAP   Wears contact lenses    sometimes    Past Surgical History:  Procedure Laterality Date   COLONOSCOPY     NASAL SEPTOPLASTY W/ TURBINOPLASTY Bilateral 06/30/2021   Procedure: NASAL SEPTOPLASTY WITH TURBINATE REDUCTION, SUBMUCOUS RESECTION;  Surgeon: Clyde Canterbury, MD;  Location: [place];  Service: ENT;  Laterality: Bilateral;   WISDOM TOOTH EXTRACTION      Prior to Admission medications   Medication Sig Start Date End Date Taking? Authorizing Provider  azelastine (OPTIVAR) 0.05 % ophthalmic solution INSTILL 1 DROP IN AFFECTED EYES TWICE A DAY. 02/08/17  Yes Johnson, Megan P, DO  Azelastine HCl 0.15 % SOLN Place into both nostrils. 01/09/21  Yes [provider]  clonazePAM (KLONOPIN) 1 MG tablet Take 1 tablet (1 mg total) by mouth daily. 05/25/22  Yes Kathrine Haddock, NP  Erenumab-aooe 140 MG/ML SOAJ Inject into the skin every 30 (thirty) days. Takes on the 12th   Yes [provider]  fluorometholone (FML) 0.1 % ophthalmic suspension  03/06/20  Yes [provider]  Meloxicam 10 MG CAPS Take by mouth.   Yes [provider]  Multiple Vitamins-Minerals (CENTRUM SILVER PO) Take by mouth daily.   Yes [provider]  omeprazole (PRILOSEC) 20 MG capsule Take 1 capsule (20 mg total) by mouth 2 (two) times daily before a meal. 05/25/22  Yes Kathrine Haddock,  NP  oxyCODONE-acetaminophen (PERCOCET) 10-325 MG tablet Take 1 tablet by mouth every 4 (four) hours as needed for pain.   Yes [provider]  SUMAtriptan (IMITREX) 100 MG tablet Take 1 tablet (100 mg total) by mouth daily as needed for migraine or headache. Maximum once a day only. Not to exceed 1 tab in 24 hrs. 05/25/22  Yes Kathrine Haddock, NP  tamsulosin (FLOMAX) 0.4 MG CAPS capsule Take 1 capsule (0.4 mg total) by mouth daily. 06/11/22  Yes Stoioff, Ronda Fairly, MD  VENTOLIN HFA 108 (90 Base) MCG/ACT inhaler  07/26/16  Yes [provider]  cyclobenzaprine (FLEXERIL) 10 MG tablet Take 1 tablet (10 mg total) by mouth 3 (three) times daily as needed for muscle spasms. Patient not taking: Reported on 09/10/2022 06/02/22   Kathrine Haddock, NP  EPINEPHrine (EPIPEN IJ) Inject as directed as needed.    [provider]  fluticasone (FLONASE) 50 MCG/ACT nasal spray Place 2 sprays into both nostrils daily. Patient not taking: Reported on 09/10/2022 11/13/18   Guadalupe Maple, MD    Allergies as of 07/26/2022 - Review Complete 07/26/2022  Allergen Reaction Noted   Shellfish-derived products Swelling 10/30/2015   Lactose Diarrhea 01/14/2020   Lactose intolerance (gi)  01/14/2020   Milk (cow)  07/25/2018    Family History  Problem Relation Age of Onset   Alcohol abuse Mother    Diabetes Mother  Cancer Mother    Diabetes Sister    Cancer Sister     Social History   Socioeconomic History   Marital status: Married    Spouse name: Not on file   Number of children: Not on file   Years of education: Not on file   Highest education level: Not on file  Occupational History   Not on file  Tobacco Use   Smoking status: Never   Smokeless tobacco: Never  Vaping Use   Vaping Use: Never used  Substance and Sexual Activity   Alcohol use: Not Currently    Alcohol/week: 2.0 standard drinks of alcohol    Types: 2 Shots of liquor per week    Comment: only on the weekends    Drug use: No   Sexual activity: Not on file  Other Topics Concern   Not on file  Social History Narrative   Not on file   Social Determinants of Health   Financial Resource Strain: Not on file  Food Insecurity: Not on file  Transportation Needs: Not on file  Physical Activity: Not on file  Stress: Not on file  Social Connections: Not on file  Intimate Partner Violence: Not on file    Review of Systems: See HPI, otherwise negative ROS  Physical Exam: BP 114/79   Temp 97.7 F (36.5 C) (Tympanic)   Resp 17   Ht 5\' 10"  (1.778 m)   Wt 85.6 kg   SpO2 97%   BMI 27.08 kg/m  General:   Alert,  pleasant and cooperative in NAD Head:  Normocephalic and atraumatic. Neck:  Supple; no masses or thyromegaly. Lungs:  Clear throughout to auscultation.    Heart:  Regular rate and rhythm. Abdomen:  Soft, nontender and nondistended. Normal bowel sounds, without guarding, and without rebound.   Neurologic:  Alert and  oriented x4;  grossly normal neurologically.  Impression/Plan: Lance Walsh is now here to undergo a screening colonoscopy.  Risks, benefits, and alternatives regarding colonoscopy have been reviewed with the patient.  Questions have been answered.  All parties agreeable.

## 2022-09-17 NOTE — Anesthesia Preprocedure Evaluation (Addendum)
Anesthesia Evaluation  Patient identified by MRN, date of birth, ID band Patient awake    History of Anesthesia Complications Negative for: history of anesthetic complications  Airway Mallampati: II  TM Distance: >3 FB Neck ROM: Full    Dental no notable dental hx.    Pulmonary sleep apnea ,    Pulmonary exam normal        Cardiovascular Exercise Tolerance: Good negative cardio ROS Normal cardiovascular exam     Neuro/Psych  Headaches, PSYCHIATRIC DISORDERS Bipolar Disorder    GI/Hepatic Neg liver ROS, GERD  ,  Endo/Other  negative endocrine ROS  Renal/GU negative Renal ROS     Musculoskeletal   Abdominal   Peds  Hematology negative hematology ROS (+)   Anesthesia Other Findings   Reproductive/Obstetrics                             Anesthesia Physical  Anesthesia Plan  ASA: 2  Anesthesia Plan: General   Post-op Pain Management: Minimal or no pain anticipated   Induction: Intravenous  PONV Risk Score and Plan: 2 and Treatment may vary due to age or medical condition  Airway Management Planned: Natural Airway  Additional Equipment:   Intra-op Plan:   Post-operative Plan:   Informed Consent: I have reviewed the patients History and Physical, chart, labs and discussed the procedure including the risks, benefits and alternatives for the proposed anesthesia with the patient or authorized representative who has indicated his/her understanding and acceptance.     Dental advisory given  Plan Discussed with: CRNA  Anesthesia Plan Comments:        Anesthesia Quick Evaluation

## 2022-09-17 NOTE — Transfer of Care (Signed)
Immediate Anesthesia Transfer of Care Note  Patient: Lance Walsh  Procedure(s) Performed: COLONOSCOPY WITH PROPOFOL (Rectum)  Patient Location: PACU  Anesthesia Type: General  Level of Consciousness: awake, alert  and patient cooperative  Airway and Oxygen Therapy: Patient Spontanous Breathing and Patient connected to supplemental oxygen  Post-op Assessment: Post-op Vital signs reviewed, Patient's Cardiovascular Status Stable, Respiratory Function Stable, Patent Airway and No signs of Nausea or vomiting  Post-op Vital Signs: Reviewed and stable  Complications: No notable events documented.

## 2022-09-17 NOTE — Anesthesia Postprocedure Evaluation (Signed)
Anesthesia Post Note  Patient: Lance Walsh  Procedure(s) Performed: COLONOSCOPY WITH PROPOFOL (Rectum)  Patient location during evaluation: Endoscopy Anesthesia Type: General Level of consciousness: awake and alert Pain management: pain level controlled Vital Signs Assessment: post-procedure vital signs reviewed and stable Respiratory status: spontaneous breathing, nonlabored ventilation and respiratory function stable Cardiovascular status: blood pressure returned to baseline and stable Postop Assessment: no apparent nausea or vomiting Anesthetic complications: no   There were no known notable events for this encounter.   Last Vitals:  Vitals:   09/17/22 1045 09/17/22 1050  BP: 107/77   Pulse: 77 75  Resp: 19 13  Temp:    SpO2: 96% 97%    Last Pain:  Vitals:   09/17/22 1050  TempSrc:   PainSc: 0-No pain                 [place] Ouch

## 2022-09-20 ENCOUNTER — Encounter: Payer: Self-pay | Admitting: Gastroenterology

## 2022-09-24 ENCOUNTER — Other Ambulatory Visit: Payer: Self-pay | Admitting: Family Medicine

## 2022-09-24 ENCOUNTER — Telehealth: Payer: Self-pay | Admitting: Nurse Practitioner

## 2022-09-24 NOTE — Telephone Encounter (Signed)
Medication was d/c's 06/11/2022. Requested Prescriptions  Pending Prescriptions Disp Refills  . meloxicam (MOBIC) 15 MG tablet [Pharmacy Med Name: MELOXICAM 15 MG TABLET] 30 tablet 0    Sig: Take 1 tablet (15 mg total) by mouth daily.     Analgesics:  COX2 Inhibitors Failed - 09/24/2022  3:26 PM      Failed - Manual Review: Labs are only required if the patient has taken medication for more than 8 weeks.      Passed - HGB in normal range and within 360 days    Hemoglobin  Date Value Ref Range Status  05/27/2022 15.9 13.0 - 17.7 g/dL Final         Passed - Cr in normal range and within 360 days    Creatinine  Date Value Ref Range Status  12/09/2020 143.5 20.0 - 300.0 mg/dL Final  10/14/2012 1.11 0.60 - 1.30 mg/dL Final   Creatinine, Ser  Date Value Ref Range Status  05/27/2022 0.95 0.76 - 1.27 mg/dL Final         Passed - HCT in normal range and within 360 days    Hematocrit  Date Value Ref Range Status  05/27/2022 47.0 37.5 - 51.0 % Final         Passed - AST in normal range and within 360 days    AST  Date Value Ref Range Status  05/27/2022 20 0 - 40 IU/L Final   SGOT(AST)  Date Value Ref Range Status  04/22/2012 42 (H) 15 - 37 Unit/L Final         Passed - ALT in normal range and within 360 days    ALT  Date Value Ref Range Status  05/27/2022 40 0 - 44 IU/L Final   SGPT (ALT)  Date Value Ref Range Status  04/22/2012 81 (H) U/L Final    Comment:    12-78 NOTE: NEW REFERENCE RANGE 10/22/2011          Passed - eGFR is 30 or above and within 360 days    EGFR (African American)  Date Value Ref Range Status  10/14/2012 >60  Final   GFR calc Af Amer  Date Value Ref Range Status  12/09/2020 96 >59 mL/min/1.73 Final    Comment:    **In accordance with recommendations from the NKF-ASN Task force,**   [facility] is in the process of updating its eGFR calculation to the   2021 CKD-EPI creatinine equation that estimates kidney function   without a race  variable.    EGFR (Non-African Amer.)  Date Value Ref Range Status  10/14/2012 >60  Final    Comment:    eGFR values <8m/min/1.73 m2 may be an indication of chronic kidney disease (CKD). Calculated eGFR is useful in patients with stable renal function. The eGFR calculation will not be reliable in acutely ill patients when serum creatinine is changing rapidly. It is not useful in  patients on dialysis. The eGFR calculation may not be applicable to patients at the low and high extremes of body sizes, pregnant women, and vegetarians.    GFR calc non Af Amer  Date Value Ref Range Status  12/09/2020 83 >59 mL/min/1.73 Final   eGFR  Date Value Ref Range Status  05/27/2022 100 >59 mL/min/1.73 Final         Passed - Patient is not pregnant      Passed - Valid encounter within last 12 months    Recent Outpatient Visits  2 months ago Prostate cancer screening   [facility] Kathrine Haddock, NP   3 months ago Bilateral sciatica   [facility] Kathrine Haddock, NP   4 months ago Chronic fatigue   [facility] [place], Malachy Mood, NP   1 year ago Migraine without aura and without status migrainosus, not intractable   [place], MD   1 year ago Chronic nonintractable headache, unspecified headache type   [place], MD      Future Appointments            In 2 weeks Jon Billings, NP [facility] [place]

## 2022-09-24 NOTE — Telephone Encounter (Signed)
PT came in requesting to speak with Lance Walsh says that he is out of his meds Meloxicam.  He has an appointment for November the 15th but he doesn't have enough pills to last him til then.  Concerned about going through withdrawals.  Please advise.

## 2022-09-27 NOTE — Telephone Encounter (Signed)
Meloxicam does not cause withdrawals.  This is not a medication that is meant to be taken daily as it can cause harm to the kidneys and raise blood pressure.

## 2022-09-27 NOTE — Telephone Encounter (Signed)
Attempted to call patient regarding meloxicam. Left detailed VM

## 2022-10-13 ENCOUNTER — Encounter: Payer: Self-pay | Admitting: Nurse Practitioner

## 2022-10-13 ENCOUNTER — Ambulatory Visit: Payer: 59 | Admitting: Nurse Practitioner

## 2022-10-13 ENCOUNTER — Telehealth: Payer: Self-pay | Admitting: Nurse Practitioner

## 2022-10-13 VITALS — BP 120/82 | HR 83 | Temp 98.6°F | Wt 198.2 lb

## 2022-10-13 DIAGNOSIS — G8929 Other chronic pain: Secondary | ICD-10-CM

## 2022-10-13 DIAGNOSIS — F3342 Major depressive disorder, recurrent, in full remission: Secondary | ICD-10-CM

## 2022-10-13 DIAGNOSIS — Z23 Encounter for immunization: Secondary | ICD-10-CM | POA: Diagnosis not present

## 2022-10-13 DIAGNOSIS — Z0000 Encounter for general adult medical examination without abnormal findings: Secondary | ICD-10-CM

## 2022-10-13 DIAGNOSIS — F419 Anxiety disorder, unspecified: Secondary | ICD-10-CM

## 2022-10-13 DIAGNOSIS — G4733 Obstructive sleep apnea (adult) (pediatric): Secondary | ICD-10-CM

## 2022-10-13 DIAGNOSIS — E7800 Pure hypercholesterolemia, unspecified: Secondary | ICD-10-CM

## 2022-10-13 DIAGNOSIS — M545 Low back pain, unspecified: Secondary | ICD-10-CM

## 2022-10-13 LAB — URINALYSIS, ROUTINE W REFLEX MICROSCOPIC
Bilirubin, UA: NEGATIVE
Glucose, UA: NEGATIVE
Ketones, UA: NEGATIVE
Leukocytes,UA: NEGATIVE
Nitrite, UA: NEGATIVE
Protein,UA: NEGATIVE
RBC, UA: NEGATIVE
Specific Gravity, UA: >1.03 — ABNORMAL HIGH (ref 1.005–1.030)
Urobilinogen, Ur: 0.2 mg/dL (ref 0.2–1.0)
pH, UA: 5.5 (ref 5.0–7.5)

## 2022-10-13 MED ORDER — OMEPRAZOLE 20 MG PO CPDR: 20.0000 mg | DELAYED_RELEASE_CAPSULE | Freq: Two times a day (BID) | ORAL | 4 refills | Status: DC

## 2022-10-13 MED ORDER — CLONAZEPAM 1 MG PO TABS: 1.0000 mg | ORAL_TABLET | Freq: Every day | ORAL | 0 refills | Status: DC

## 2022-10-13 MED ORDER — CYCLOBENZAPRINE HCL 10 MG PO TABS: 10.0000 mg | ORAL_TABLET | Freq: Three times a day (TID) | ORAL | 0 refills | Status: DC | PRN

## 2022-10-13 MED ORDER — MELOXICAM 10 MG PO CAPS: 30.0000 mg | ORAL_CAPSULE | Freq: Every day | ORAL | 1 refills | Status: DC

## 2022-10-13 NOTE — Assessment & Plan Note (Signed)
Endorses 100% use of CPAP.

## 2022-10-13 NOTE — Telephone Encounter (Signed)
Vernona Rieger from [facility] called and stated that Meloxicam 10 MG CAPS does not come in 10 MG anymore it only comes in 7.5 MG and 15 MG. She would like to know if you could send in an alternative.

## 2022-10-13 NOTE — Assessment & Plan Note (Signed)
Chronic. Not able to tolerate Gabapentin. Does not want to do injections at this time.  MRI relatively unremarkable.  Will refill Meloxicam and Flexeril.  Encouraged patient to use PRN versus daily.  Discussed long term risks of taking NSAID. Follow up in 6 months.  Call sooner if concerns arise.

## 2022-10-13 NOTE — Assessment & Plan Note (Signed)
Chronic.  Controlled.  Continue with current medication regimen.  Labs ordered today.  Return to clinic in 6 months for reevaluation.  Call sooner if concerns arise.  ? ?

## 2022-10-13 NOTE — Assessment & Plan Note (Signed)
Chronic.  Controlled.  Continue with current medication regimen of Klonopin. Refilled at visit today.  Labs ordered today.  Return to clinic in 6 months for reevaluation.  Call sooner if concerns arise.

## 2022-10-13 NOTE — Progress Notes (Signed)
BP 120/82   Pulse 83   Temp 98.6 F (37 C) (Oral)   Wt 198 lb 3.2 oz (89.9 kg)   SpO2 96%   BMI 28.44 kg/m    Subjective:    Patient ID: Lance Walsh, male    DOB: 06-24-1976, 46 y.o.   MRN: [id number]  HPI: Lance Walsh is a 46 y.o. male presenting on 10/13/2022 for comprehensive medical examination. Current medical complaints include:none  He currently lives with: Interim Problems from his last visit: no  BACK PAIN Patient states he has been having back pain.  Used to get back injections but doesn't want to do that as often.  States he hasn't been able to hold a hammer.  He feels like his back is swollen and there is a lot of pressure in his back.  Fingers are also back to hurting.  Was taking meloxicam and his pain was improved.  States he has more trouble when he comes off of it.      Depression Screen done today and results listed below:     10/13/2022    3:02 PM 07/22/2022    2:04 PM 05/25/2022    2:58 PM 03/19/2021    3:16 PM 02/18/2021    3:23 PM  Depression screen PHQ 2/9  Decreased Interest 0 1 0 0 3  Down, Depressed, Hopeless 0 0 0 0 1  PHQ - 2 Score 0 1 0 0 4  Altered sleeping 0 0 1 0 3  Tired, decreased energy 0 2 3 0 3  Change in appetite 0 1 1 0 0  Feeling bad or failure about yourself  0 0 0 0 0  Trouble concentrating 0 0 0 0 0  Moving slowly or fidgety/restless 0 0 0 0 0  Suicidal thoughts 0 0 0 0 0  PHQ-9 Score 0 4 5 0 10  Difficult doing work/chores Not difficult at all Somewhat difficult Very difficult  Somewhat difficult    The patient does not have a history of falls. I did complete a risk assessment for falls. A plan of care for falls was documented.   Past Medical History:  Past Medical History:  Diagnosis Date   Bipolar 1 disorder (HCC)    Calculus of kidney    Depression    Fatty liver    GERD (gastroesophageal reflux disease)    Headache    migraine and sinus   History of kidney stones    Plantar fasciitis    Sleep apnea     uses CPAP   Wears contact lenses    sometimes    Surgical History:  Past Surgical History:  Procedure Laterality Date   COLONOSCOPY     COLONOSCOPY WITH PROPOFOL N/A 09/17/2022   Procedure: COLONOSCOPY WITH PROPOFOL;  Surgeon: Midge MiniumWohl, Darren, MD;  [facility];  Service: Endoscopy;  Laterality: N/A;   NASAL SEPTOPLASTY W/ TURBINOPLASTY Bilateral 06/30/2021   Procedure: NASAL SEPTOPLASTY WITH TURBINATE REDUCTION, SUBMUCOUS RESECTION;  Surgeon: Geanie LoganBennett, Alistair, MD;  [facility];  Service: ENT;  Laterality: Bilateral;   REFRACTIVE SURGERY Bilateral 01/2022   WISDOM TOOTH EXTRACTION      Medications:  Current Outpatient Medications on File Prior to Visit  Medication Sig   Erenumab-aooe 140 MG/ML SOAJ Inject into the skin every 30 (thirty) days. Takes on the 12th   fluorometholone (FML) 0.1 % ophthalmic suspension    Multiple Vitamins-Minerals (CENTRUM SILVER PO) Take by mouth daily.   oxyCODONE-acetaminophen (PERCOCET) 10-325  MG tablet Take 1 tablet by mouth every 4 (four) hours as needed for pain.   SUMAtriptan (IMITREX) 100 MG tablet Take 1 tablet (100 mg total) by mouth daily as needed for migraine or headache. Maximum once a day only. Not to exceed 1 tab in 24 hrs.   VENTOLIN HFA 108 (90 Base) MCG/ACT inhaler    EPINEPHrine (EPIPEN IJ) Inject as directed as needed.   Current Facility-Administered Medications on File Prior to Visit  Medication   betamethasone acetate-betamethasone sodium phosphate (CELESTONE) injection 3 mg    Allergies:  Allergies  Allergen Reactions   Shellfish-Derived Products Swelling    Throat Closing   Lactose Diarrhea    Diarrhea   Lactose Intolerance (Gi)     Diarrhea    Milk (Cow)     Social History:  Social History   Socioeconomic History   Marital status: Married    Spouse name: Not on file   Number of children: Not on file   Years of education: Not on file   Highest education level: Not on file   Occupational History   Not on file  Tobacco Use   Smoking status: Never   Smokeless tobacco: Never  Vaping Use   Vaping Use: Never used  Substance and Sexual Activity   Alcohol use: Not Currently    Alcohol/week: 2.0 standard drinks of alcohol    Types: 2 Shots of liquor per week    Comment: only on the weekends   Drug use: No   Sexual activity: Not on file  Other Topics Concern   Not on file  Social History Narrative   Not on file   Social Determinants of Health   Financial Resource Strain: Not on file  Food Insecurity: Not on file  Transportation Needs: Not on file  Physical Activity: Not on file  Stress: Not on file  Social Connections: Not on file  Intimate Partner Violence: Not on file   Social History   Tobacco Use  Smoking Status Never  Smokeless Tobacco Never   Social History   Substance and Sexual Activity  Alcohol Use Not Currently   Alcohol/week: 2.0 standard drinks of alcohol   Types: 2 Shots of liquor per week   Comment: only on the weekends    Family History:  Family History  Problem Relation Age of Onset   Alcohol abuse Mother    Diabetes Mother    Cancer Mother    Diabetes Sister    Cancer Sister     Past medical history, surgical history, medications, allergies, family history and social history reviewed with patient today and changes made to appropriate areas of the chart.   Review of Systems  Musculoskeletal:  Positive for back pain.   All other ROS negative except what is listed above and in the HPI.      Objective:    BP 120/82   Pulse 83   Temp 98.6 F (37 C) (Oral)   Wt 198 lb 3.2 oz (89.9 kg)   SpO2 96%   BMI 28.44 kg/m   Wt Readings from Last 3 Encounters:  10/13/22 198 lb 3.2 oz (89.9 kg)  09/17/22 188 lb 11.2 oz (85.6 kg)  07/22/22 207 lb 1.6 oz (93.9 kg)    Physical Exam Vitals and nursing note reviewed.  Constitutional:      General: He is not in acute distress.    Appearance: Normal appearance. He is not  ill-appearing, toxic-appearing or diaphoretic.  HENT:  Head: Normocephalic.     Right Ear: Tympanic membrane, ear canal and external ear normal.     Left Ear: Tympanic membrane, ear canal and external ear normal.     Nose: Nose normal. No congestion or rhinorrhea.     Mouth/Throat:     Mouth: Mucous membranes are moist.  Eyes:     General:        Right eye: No discharge.        Left eye: No discharge.     Extraocular Movements: Extraocular movements intact.     Conjunctiva/sclera: Conjunctivae normal.     Pupils: Pupils are equal, round, and reactive to light.  Cardiovascular:     Rate and Rhythm: Normal rate and regular rhythm.     Heart sounds: No murmur heard. Pulmonary:     Effort: Pulmonary effort is normal. No respiratory distress.     Breath sounds: Normal breath sounds. No wheezing, rhonchi or rales.  Abdominal:     General: Abdomen is flat. Bowel sounds are normal. There is no distension.     Palpations: Abdomen is soft.     Tenderness: There is no abdominal tenderness. There is no guarding.  Musculoskeletal:     Cervical back: Normal range of motion and neck supple.  Skin:    General: Skin is warm and dry.     Capillary Refill: Capillary refill takes less than 2 seconds.  Neurological:     General: No focal deficit present.     Mental Status: He is alert and oriented to person, place, and time.     Cranial Nerves: No cranial nerve deficit.     Motor: No weakness.     Deep Tendon Reflexes: Reflexes normal.  Psychiatric:        Mood and Affect: Mood normal.        Behavior: Behavior normal.        Thought Content: Thought content normal.        Judgment: Judgment normal.     Results for orders placed or performed in visit on 07/22/22  Lipid Panel w/o Chol/HDL Ratio  Result Value Ref Range   Cholesterol, Total 241 (H) 100 - 199 mg/dL   Triglycerides 449 (H) 0 - 149 mg/dL   HDL 50 >20 mg/dL   VLDL Cholesterol Cal 50 (H) 5 - 40 mg/dL   LDL Chol Calc (NIH)  141 (H) 0 - 99 mg/dL  PSA  Result Value Ref Range   Prostate Specific Ag, Serum 0.4 0.0 - 4.0 ng/mL  Hepatitis C antibody  Result Value Ref Range   Hep C Virus Ab Non Reactive Non Reactive      Assessment & Plan:   Problem List Items Addressed This Visit       Respiratory   Sleep apnea    Endorses 100% use of CPAP.        Other   Depression    Chronic.  Controlled.  Continue with current medication regimen of Klonopin. Refilled at visit today.  Labs ordered today.  Return to clinic in 6 months for reevaluation.  Call sooner if concerns arise.        Elevated low density lipoprotein (LDL) cholesterol level    Chronic.  Controlled.  Continue with current medication regimen.  Labs ordered today.  Return to clinic in 6 months for reevaluation.  Call sooner if concerns arise.         Chronic back pain    Chronic. Not able to tolerate Gabapentin.  Does not want to do injections at this time.  MRI relatively unremarkable.  Will refill Meloxicam and Flexeril.  Encouraged patient to use PRN versus daily.  Discussed long term risks of taking NSAID. Follow up in 6 months.  Call sooner if concerns arise.       Relevant Medications   Meloxicam 10 MG CAPS   cyclobenzaprine (FLEXERIL) 10 MG tablet   clonazePAM (KLONOPIN) 1 MG tablet   Anxiety    Chronic.  Controlled.  Continue with current medication regimen of Klonopin. Refilled at visit today.  Labs ordered today.  Return to clinic in 6 months for reevaluation.  Call sooner if concerns arise.       Other Visit Diagnoses     Annual physical exam    -  Primary   Health maintenance reviewed during visit today.  Labs ordered today. Flu shot up to date.  Colonoscopy up to date.   Relevant Orders   TSH   CBC with Differential/Platelet   Comprehensive metabolic panel   Urinalysis, Routine w reflex microscopic   Need for influenza vaccination       Relevant Orders   Flu Vaccine QUAD 6+ mos PF IM (Fluarix Quad PF)         Discussed aspirin prophylaxis for myocardial infarction prevention and decision was it was not indicated  LABORATORY TESTING:  Health maintenance labs ordered today as discussed above.     IMMUNIZATIONS:   - Tdap: Tetanus vaccination status reviewed: last tetanus booster within 10 years. - Influenza: Administered today - Pneumovax: Not applicable - Prevnar: Not applicable - COVID: Not applicable - HPV: Not applicable - Shingrix vaccine: Not applicable  SCREENING: - Colonoscopy: Up to date  Discussed with patient purpose of the colonoscopy is to detect colon cancer at curable precancerous or early stages   - AAA Screening: Not applicable  -Hearing Test: Not applicable  -Spirometry: Not applicable   PATIENT COUNSELING:    Sexuality: Discussed sexually transmitted diseases, partner selection, use of condoms, avoidance of unintended pregnancy  and contraceptive alternatives.   Advised to avoid cigarette smoking.  I discussed with the patient that most people either abstain from alcohol or drink within safe limits (<=14/week and <=4 drinks/occasion for males, <=7/weeks and <= 3 drinks/occasion for females) and that the risk for alcohol disorders and other health effects rises proportionally with the number of drinks per week and how often a drinker exceeds daily limits.  Discussed cessation/primary prevention of drug use and availability of treatment for abuse.   Diet: Encouraged to adjust caloric intake to maintain  or achieve ideal body weight, to reduce intake of dietary saturated fat and total fat, to limit sodium intake by avoiding high sodium foods and not adding table salt, and to maintain adequate dietary potassium and calcium preferably from fresh fruits, vegetables, and low-fat dairy products.    stressed the importance of regular exercise  Injury prevention: Discussed safety belts, safety helmets, smoke detector, smoking near bedding or upholstery.   Dental health:  Discussed importance of regular tooth brushing, flossing, and dental visits.   Follow up plan: NEXT PREVENTATIVE PHYSICAL DUE IN 1 YEAR. Return in about 6 months (around 04/13/2023) for HTN, HLD, DM2 FU.

## 2022-10-13 NOTE — Assessment & Plan Note (Signed)
Chronic.  Controlled.  Continue with current medication regimen of Klonopin. Refilled at visit today.  Labs ordered today.  Return to clinic in 6 months for reevaluation.  Call sooner if concerns arise.  

## 2022-10-14 LAB — CBC WITH DIFFERENTIAL/PLATELET
Basophils Absolute: 0.1 10*3/uL (ref 0.0–0.2)
Basos: 1 %
EOS (ABSOLUTE): 0.2 10*3/uL (ref 0.0–0.4)
Eos: 2 %
Hematocrit: 48.3 % (ref 37.5–51.0)
Hemoglobin: 16.1 g/dL (ref 13.0–17.7)
Immature Grans (Abs): 0 10*3/uL (ref 0.0–0.1)
Immature Granulocytes: 0 %
Lymphocytes Absolute: 2 10*3/uL (ref 0.7–3.1)
Lymphs: 19 %
MCH: 29.8 pg (ref 26.6–33.0)
MCHC: 33.3 g/dL (ref 31.5–35.7)
MCV: 89 fL (ref 79–97)
Monocytes Absolute: 0.8 10*3/uL (ref 0.1–0.9)
Monocytes: 7 %
Neutrophils Absolute: 7.6 10*3/uL — ABNORMAL HIGH (ref 1.4–7.0)
Neutrophils: 71 %
Platelets: 281 10*3/uL (ref 150–450)
RBC: 5.4 x10E6/uL (ref 4.14–5.80)
RDW: 12.4 % (ref 11.6–15.4)
WBC: 10.7 10*3/uL (ref 3.4–10.8)

## 2022-10-14 LAB — COMPREHENSIVE METABOLIC PANEL
ALT: 26 IU/L (ref 0–44)
AST: 16 IU/L (ref 0–40)
Albumin/Globulin Ratio: 2.5 — ABNORMAL HIGH (ref 1.2–2.2)
Albumin: 4.8 g/dL (ref 4.1–5.1)
Alkaline Phosphatase: 56 IU/L (ref 44–121)
BUN/Creatinine Ratio: 21 — ABNORMAL HIGH (ref 9–20)
BUN: 19 mg/dL (ref 6–24)
Bilirubin Total: 0.4 mg/dL (ref 0.0–1.2)
CO2: 25 mmol/L (ref 20–29)
Calcium: 9.9 mg/dL (ref 8.7–10.2)
Chloride: 102 mmol/L (ref 96–106)
Creatinine, Ser: 0.9 mg/dL (ref 0.76–1.27)
Globulin, Total: 1.9 g/dL (ref 1.5–4.5)
Glucose: 100 mg/dL — ABNORMAL HIGH (ref 70–99)
Potassium: 3.7 mmol/L (ref 3.5–5.2)
Sodium: 140 mmol/L (ref 134–144)
Total Protein: 6.7 g/dL (ref 6.0–8.5)
eGFR: 107 mL/min/{1.73_m2} (ref >59)

## 2022-10-14 LAB — TSH: TSH: 0.772 u[IU]/mL (ref 0.450–4.500)

## 2022-10-14 MED ORDER — MELOXICAM 15 MG PO TABS: 30.0000 mg | ORAL_TABLET | Freq: Every day | ORAL | 1 refills | Status: DC

## 2022-10-14 NOTE — Telephone Encounter (Signed)
Dose adjusted and sent to the pharmacy.

## 2022-10-14 NOTE — Progress Notes (Signed)
Hi Lance Walsh. It was nice to meet you yesterday.  Your lab work looks good.  No concerns at this time. Continue with your current medication regimen.  Follow up as discussed.  Please let me know if you have any questions.

## 2022-10-14 NOTE — Telephone Encounter (Signed)
Patient has verbalized understanding.  

## 2022-11-26 ENCOUNTER — Ambulatory Visit: Payer: 59 | Admitting: Physician Assistant

## 2022-11-26 ENCOUNTER — Encounter: Payer: Self-pay | Admitting: Physician Assistant

## 2022-11-26 VITALS — BP 108/71 | HR 87 | Temp 99.9°F | Wt 197.9 lb

## 2022-11-26 DIAGNOSIS — R0981 Nasal congestion: Secondary | ICD-10-CM | POA: Diagnosis not present

## 2022-11-26 DIAGNOSIS — R110 Nausea: Secondary | ICD-10-CM | POA: Diagnosis not present

## 2022-11-26 DIAGNOSIS — J029 Acute pharyngitis, unspecified: Secondary | ICD-10-CM | POA: Diagnosis not present

## 2022-11-26 DIAGNOSIS — R051 Acute cough: Secondary | ICD-10-CM

## 2022-11-26 DIAGNOSIS — J0100 Acute maxillary sinusitis, unspecified: Secondary | ICD-10-CM

## 2022-11-26 MED ORDER — PROMETHAZINE-DM 6.25-15 MG/5ML PO SYRP: 5.0000 mL | ORAL_SOLUTION | Freq: Four times a day (QID) | ORAL | 0 refills | Status: DC | PRN

## 2022-11-26 MED ORDER — ONDANSETRON HCL 4 MG PO TABS: 4.0000 mg | ORAL_TABLET | Freq: Three times a day (TID) | ORAL | 0 refills | Status: DC | PRN

## 2022-11-26 MED ORDER — AMOXICILLIN-POT CLAVULANATE 875-125 MG PO TABS: 1.0000 | ORAL_TABLET | Freq: Two times a day (BID) | ORAL | 0 refills | Status: DC

## 2022-11-26 NOTE — Progress Notes (Signed)
Acute Office Visit   Patient: Lance Walsh   DOB: 09/13/1976   46 y.o. Male  MRN: [id number] Visit Date: 11/26/2022  Today's healthcare provider: Dani Gobble Taris Galindo, PA-C  Introduced myself to the patient as a Journalist, newspaper and provided education on APPs in clinical practice.    Chief Complaint  Patient presents with   URI   Subjective    URI  Associated symptoms include congestion, coughing, headaches, nausea, rhinorrhea, sinus pain, a sore throat and vomiting. Pertinent negatives include no wheezing.      Onset: gradual  Duration: started several weeks ago with waxing and waning severity - over the past few days has gotten worse  Associated symptoms: Congestion, rhinorrhea, productive cough, sore throat, sinus pressure and headaches Vomited yesterday and has been nauseous for the past two days  Reports he checked temp yesterday at work and was told he had a fever Interventions: Allegra D, Mucinex, nasal flushes  Sick contacts: several people have been sick at work and home with different dx  COVID testing at home: has not tested for COVID at home   Results: NA   Medications: Outpatient Medications Prior to Visit  Medication Sig   clonazePAM (KLONOPIN) 1 MG tablet Take 1 tablet (1 mg total) by mouth daily.   EPINEPHrine (EPIPEN IJ) Inject as directed as needed.   Erenumab-aooe 140 MG/ML SOAJ Inject into the skin every 30 (thirty) days. Takes on the 12th   fluorometholone (FML) 0.1 % ophthalmic suspension    meloxicam (MOBIC) 15 MG tablet Take 2 tablets (30 mg total) by mouth daily.   Multiple Vitamins-Minerals (CENTRUM SILVER PO) Take by mouth daily.   omeprazole (PRILOSEC) 20 MG capsule Take 1 capsule (20 mg total) by mouth 2 (two) times daily before a meal.   RESTASIS 0.05 % ophthalmic emulsion Place 1 drop into both eyes 4 (four) times daily.   SUMAtriptan (IMITREX) 100 MG tablet Take 1 tablet (100 mg total) by mouth daily as needed for migraine or headache. Maximum once a  day only. Not to exceed 1 tab in 24 hrs.   VENTOLIN HFA 108 (90 Base) MCG/ACT inhaler    [DISCONTINUED] oxyCODONE-acetaminophen (PERCOCET) 10-325 MG tablet Take 1 tablet by mouth every 4 (four) hours as needed for pain.   cyclobenzaprine (FLEXERIL) 10 MG tablet Take 1 tablet (10 mg total) by mouth 3 (three) times daily as needed for muscle spasms. (Patient not taking: Reported on 11/26/2022)   Facility-Administered Medications Prior to Visit  Medication Dose Route Frequency Provider   betamethasone acetate-betamethasone sodium phosphate (CELESTONE) injection 3 mg  3 mg Intra-articular Once Edrick Kins, DPM    Review of Systems  Constitutional:  Positive for diaphoresis and fatigue. Negative for chills and fever.  HENT:  Positive for congestion, rhinorrhea, sinus pressure, sinus pain and sore throat.   Respiratory:  Positive for cough. Negative for shortness of breath and wheezing.   Gastrointestinal:  Positive for nausea and vomiting.  Neurological:  Positive for headaches.  Hematological:  Positive for adenopathy.       Objective    BP 108/71   Pulse 87   Temp 99.9 F (37.7 C) (Oral)   Wt 197 lb 14.4 oz (89.8 kg)   SpO2 97%   BMI 28.40 kg/m    Physical Exam Vitals reviewed.  Constitutional:      General: He is awake.     Appearance: Normal appearance. He is well-developed and well-groomed. He  is ill-appearing.  HENT:     Head: Normocephalic and atraumatic.     Right Ear: Hearing, ear canal and external ear normal. Tympanic membrane is injected and erythematous. Tympanic membrane is not scarred, perforated, retracted or bulging.     Left Ear: Hearing, tympanic membrane, ear canal and external ear normal. Tympanic membrane is not injected, scarred, perforated, erythematous, retracted or bulging.     Nose: Nasal tenderness and congestion present. No nasal deformity, signs of injury, mucosal edema or rhinorrhea.     Right Sinus: No maxillary sinus tenderness or frontal sinus  tenderness.     Left Sinus: Maxillary sinus tenderness and frontal sinus tenderness present.     Mouth/Throat:     Lips: Pink.     Mouth: Mucous membranes are moist.     Pharynx: Uvula midline. No pharyngeal swelling, oropharyngeal exudate, posterior oropharyngeal erythema or uvula swelling.  Eyes:     General: Lids are normal. Gaze aligned appropriately. Allergic shiner present.     Extraocular Movements: Extraocular movements intact.     Conjunctiva/sclera: Conjunctivae normal.  Cardiovascular:     Rate and Rhythm: Normal rate and regular rhythm.     Heart sounds: Normal heart sounds. No murmur heard.    No friction rub. No gallop.  Pulmonary:     Effort: Pulmonary effort is normal.     Breath sounds: Normal breath sounds. No decreased air movement. No decreased breath sounds, wheezing, rhonchi or rales.  Neurological:     Mental Status: He is alert.  Psychiatric:        Behavior: Behavior is cooperative.       No results found for any visits on 11/26/22.  Assessment & Plan      No follow-ups on file.      Problem List Items Addressed This Visit   None Visit Diagnoses     Acute maxillary sinusitis, recurrence not specified    -  Primary Acute, new concern Reports sinus congestion, sinus pain and pressure, productive cough, sore throat, headaches for several weeks that is not resolving with home measures Negative for strep and flu a, B- reviewed results with patient during apt  PE was consistent with sinusitis and given duration of symptoms will start abx therapy Recommend Augmentin 875-125 mg PO BID x 7 days  Will also provide Zofran for acute nausea  Due to persistent cough will send in script for Promethazine-DM syrup  Reviewed using OTC medications for further symptomatic relief Follow up as needed for persistent or progressing symptoms    Relevant Medications   amoxicillin-clavulanate (AUGMENTIN) 875-125 MG tablet   promethazine-dextromethorphan  (PROMETHAZINE-DM) 6.25-15 MG/5ML syrup   Sore throat       Relevant Orders   Veritor Flu A/B Waived   Rapid Strep screen([facility])   Novel Coronavirus, NAA ([facility])   Congestion of nasal sinus       Relevant Orders   Veritor Flu A/B Waived   Novel Coronavirus, NAA ([facility])   Nausea       Relevant Medications   ondansetron (ZOFRAN) 4 MG tablet   promethazine-dextromethorphan (PROMETHAZINE-DM) 6.25-15 MG/5ML syrup   Acute cough       Relevant Medications   promethazine-dextromethorphan (PROMETHAZINE-DM) 6.25-15 MG/5ML syrup        No follow-ups on file.   I, Chia Mowers E Boneta Standre, PA-C, have reviewed all documentation for this visit. The documentation on 11/26/22 for the exam, diagnosis, procedures, and orders are all accurate and complete.   Chevi Lim, MHS, PA-C  [facility]

## 2022-11-26 NOTE — Patient Instructions (Signed)
Based on your symptoms and duration of illness, I believe you may have a bacterial sinus infection  These typically resolve with antibiotic therapy along with at-home comfort measures  Today I have sent in a prescription for Augmentin 875- 125 mg to be taken by mouth twice per day for 7 days FINISH THE ENTIRE COURSE unless you develop an allergic reaction or are instructed to discontinue.  It can take a few days for the antibiotic to kick in so I recommend symptomatic relief with over the counter medication such as the following: Dayquil/ Nyquil Theraflu Alkaseltzer  Coricidin - if you have high blood pressure even if it is well managed with medications  These medications typically have Tylenol in them already so you can take Ibuprofen as needed for further pain/ discomfort and fever management/ do not need to supplement with more outside of those medications  Stay well hydrated with at least 75 oz of water per day to help with recovery  If you notice any of the following please let us know: increased fever not responding to Tylenol or Ibuprofen, swelling around your nose or eyes, difficulty seeing,   

## 2022-11-28 LAB — NOVEL CORONAVIRUS, NAA: SARS-CoV-2, NAA: NOT DETECTED

## 2022-11-29 LAB — RAPID STREP SCREEN (MED CTR MEBANE ONLY): Strep Gp A Ag, IA W/Reflex: NEGATIVE

## 2022-11-29 LAB — CULTURE, GROUP A STREP: Strep A Culture: NEGATIVE

## 2022-11-29 LAB — VERITOR FLU A/B WAIVED
Influenza A: NEGATIVE
Influenza B: NEGATIVE

## 2022-12-03 ENCOUNTER — Other Ambulatory Visit: Payer: Self-pay | Admitting: Physician Assistant

## 2022-12-03 ENCOUNTER — Telehealth: Payer: Self-pay | Admitting: Nurse Practitioner

## 2022-12-03 DIAGNOSIS — J0100 Acute maxillary sinusitis, unspecified: Secondary | ICD-10-CM

## 2022-12-03 MED ORDER — PREDNISONE 20 MG PO TABS: ORAL_TABLET | ORAL | 0 refills | Status: DC

## 2022-12-03 MED ORDER — DOXYCYCLINE HYCLATE 100 MG PO TABS: 100.0000 mg | ORAL_TABLET | Freq: Two times a day (BID) | ORAL | 0 refills | Status: AC

## 2022-12-03 NOTE — Telephone Encounter (Signed)
Patient notified and verbalized understanding. 

## 2022-12-03 NOTE — Telephone Encounter (Signed)
Can we find out what symptoms he is still having? The antibiotic should have cleared most of  the nasal congestion up but he may have some lingering cough or headache for a bit as it fully resolves. If he has had no improvement at all we may need to try something else.

## 2022-12-03 NOTE — Telephone Encounter (Signed)
Copied from [place] [id number]. Topic: General - Inquiry >> Dec 03, 2022  9:06 AM Penni Bombard wrote: Reason for CRM: pt called saying he was in for sinus infection and was prescribed amoxil.  He has finished that and still has symptoms.  He wants to know if he needs to take another antibiotic.  Cough medication is not helping.  CB@  [phone]

## 2022-12-03 NOTE — Telephone Encounter (Signed)
Spoke with patient to gather more information. Patient says he is still coughing up and blowing out yellow phlegm. Patient says at night is it worse as he is having a sore throat from the coughing fit he has in the afternoon. Patient says today was his last day of the antibiotics. Please advise?

## 2022-12-21 ENCOUNTER — Ambulatory Visit (INDEPENDENT_AMBULATORY_CARE_PROVIDER_SITE_OTHER): Payer: 59

## 2022-12-21 ENCOUNTER — Other Ambulatory Visit: Payer: Self-pay | Admitting: Podiatry

## 2022-12-21 ENCOUNTER — Ambulatory Visit: Payer: 59 | Admitting: Podiatry

## 2022-12-21 DIAGNOSIS — M722 Plantar fascial fibromatosis: Secondary | ICD-10-CM

## 2023-01-03 DIAGNOSIS — M722 Plantar fascial fibromatosis: Secondary | ICD-10-CM | POA: Diagnosis not present

## 2023-01-03 MED ORDER — BETAMETHASONE SOD PHOS & ACET 6 (3-3) MG/ML IJ SUSP
3.0000 mg | Freq: Once | INTRAMUSCULAR | Status: AC
  Administered 2023-01-03: 3 mg via INTRA_ARTICULAR

## 2023-01-03 NOTE — Progress Notes (Signed)
Chief Complaint  Patient presents with   Plantar Fasciitis    Bilateral plantar fasciitis, rate of pain 10 out of 10, would like to talk about surgery     Subjective: 47 y.o. male presenting today for follow-up evaluation of chronic heel pain to the bilateral heels.  Patient suffers from bilateral plantar fasciitis for a few years now.  Last seen in the office 05/21/2022.  At that time we had arranged for surgery however he was not in a position with insurance and financially to pursue the surgery.  Patient states that since that visit he continues to have pain and tenderness.  The foreign body lesion to the great toe joint appears to have mostly resolved.  He presents today for surgical consultation and reauthorization to proceed with surgery at this time.  He states that he now has insurance to proceed with the surgery for the chronic plantar fasciitis   Past Medical History:  Diagnosis Date   Bipolar 1 disorder ([place])    Calculus of kidney    Depression    Fatty liver    GERD (gastroesophageal reflux disease)    Headache    migraine and sinus   History of kidney stones    Plantar fasciitis    Sleep apnea    uses CPAP   Wears contact lenses    sometimes   Past Surgical History:  Procedure Laterality Date   COLONOSCOPY     COLONOSCOPY WITH PROPOFOL N/A 09/17/2022   Procedure: COLONOSCOPY WITH PROPOFOL;  Surgeon: Lucilla Lame, MD;  Location: [facility];  Service: Endoscopy;  Laterality: N/A;   NASAL SEPTOPLASTY W/ TURBINOPLASTY Bilateral 06/30/2021   Procedure: NASAL SEPTOPLASTY WITH TURBINATE REDUCTION, SUBMUCOUS RESECTION;  Surgeon: Clyde Canterbury, MD;  Location: [facility];  Service: ENT;  Laterality: Bilateral;   REFRACTIVE SURGERY Bilateral 01/2022   WISDOM TOOTH EXTRACTION     Allergies  Allergen Reactions   Shellfish-Derived Products Swelling    Throat Closing   Lactose Diarrhea    Diarrhea   Lactose Intolerance (Gi)     Diarrhea    Milk ([place])      Objective: Physical Exam General: The patient is alert and oriented x3 in no acute distress.  Dermatology: Skin is warm, dry and supple bilateral lower extremities.  The lesion to the medial aspect of the left great toe appears mostly resolved.  Currently there is no concern for underlying abscess or foreign body that was noticed last visit.  Vascular: Dorsalis Pedis and Posterior Tibial pulses palpable bilateral.  Capillary fill time is immediate to all digits.  Neurological: Epicritic and protective threshold intact bilateral.   Musculoskeletal: There continues to be chronic tenderness to palpation to the plantar aspect of the bilateral heels along the plantar fascia. All other joints range of motion within normal limits bilateral. Strength 5/5 in all groups bilateral.    Assessment: 1. plantar fasciitis bilateral feet. RT > LT   Plan of Care:  1. Patient evaluated.   2. Injection of 0.5cc Celestone soluspan injected into the bilateral plantar fascia 3.  Patient states that his insurance has changed and now he is in a position to pursue surgery again. 4.  We discussed again today the conservative versus surgical management of the presenting pathology.  The patient has now had heel pain he states for a few years now intermittently.  The patient opts for surgical management. All possible complications and details of the procedure were explained. All patient questions were answered. No  guarantees were expressed or implied. 5. Authorization for surgery was re-initiated today. Surgery will consist of endoscopic plantar fasciotomy bilateral.   6.  Return to clinic 1 week postop   Edrick Kins, DPM [facility]  Dr. Edrick Kins, DPM    [address], [place] [address]                Office [phone]  Fax [phone]

## 2023-01-04 DIAGNOSIS — M79676 Pain in unspecified toe(s): Secondary | ICD-10-CM

## 2023-01-10 ENCOUNTER — Telehealth: Payer: Self-pay | Admitting: Urology

## 2023-01-10 NOTE — Telephone Encounter (Signed)
DOS - 01/27/23  EPF BILAT --- [id number] EXC NAIL PERM 4TH BILAT --- [place] [id number] AND [id number] HAS BEEN APPROVED, AUTH # [id number], GOOD FROM 2/299/24 - 04/27/23.

## 2023-01-27 ENCOUNTER — Other Ambulatory Visit: Payer: Self-pay | Admitting: Podiatry

## 2023-01-27 ENCOUNTER — Encounter: Payer: Self-pay | Admitting: Podiatry

## 2023-01-27 DIAGNOSIS — L600 Ingrowing nail: Secondary | ICD-10-CM

## 2023-01-27 DIAGNOSIS — M722 Plantar fascial fibromatosis: Secondary | ICD-10-CM

## 2023-01-27 MED ORDER — OXYCODONE-ACETAMINOPHEN 5-325 MG PO TABS: 1.0000 | ORAL_TABLET | ORAL | 0 refills | Status: DC | PRN

## 2023-01-27 MED ORDER — IBUPROFEN 800 MG PO TABS: 800.0000 mg | ORAL_TABLET | Freq: Three times a day (TID) | ORAL | 1 refills | Status: DC

## 2023-01-27 NOTE — Progress Notes (Signed)
PRN postop 

## 2023-01-28 ENCOUNTER — Telehealth: Payer: Self-pay

## 2023-01-28 NOTE — Telephone Encounter (Signed)
POST OP CALL-    1) General condition stated by the patient: patient is sore   2) Is the pt having pain? Some pain   3) Pain score: 4 out of 10   4) Has the pt taken Rx'd pain medication, regularly or PRN? PRN, patient is using the pain meds for night time  5) Is the pain medication giving relief? No, told patient to take every 4 hrs.  6) Any fever, chills, nausea, or vomiting, shortness of breath or tightness in calf? No   7) Is the bandage clean, dry and intact? Yes   8) Is there excessive tightness, bleeding or drainage coming through the bandage?no   9) Did you understand all of the post op instruction sheet given?yes   10) Any questions or concerns regarding post op care/recovery?not at this time     Confirmed POV appointment with patient

## 2023-02-01 ENCOUNTER — Other Ambulatory Visit: Payer: Self-pay | Admitting: Unknown Physician Specialty

## 2023-02-01 ENCOUNTER — Encounter: Payer: Self-pay | Admitting: Podiatry

## 2023-02-01 ENCOUNTER — Ambulatory Visit (INDEPENDENT_AMBULATORY_CARE_PROVIDER_SITE_OTHER): Payer: 59 | Admitting: Podiatry

## 2023-02-01 VITALS — BP 122/83 | HR 81

## 2023-02-01 DIAGNOSIS — Z9889 Other specified postprocedural states: Secondary | ICD-10-CM

## 2023-02-01 MED ORDER — OXYCODONE-ACETAMINOPHEN 5-325 MG PO TABS: 1.0000 | ORAL_TABLET | Freq: Four times a day (QID) | ORAL | 0 refills | Status: DC | PRN

## 2023-02-01 NOTE — Progress Notes (Signed)
   Chief Complaint  Patient presents with   Routine Post Op    "They're sore."    Subjective:  Patient presents today status post endoscopic plantar fasciotomy.  DOS: 01/27/2023.  Patient doing well.  He does have some soreness to the heels.  Dressings are clean dry and intact.  He is WBAT surgical shoes bilateral  Past Medical History:  Diagnosis Date   Bipolar 1 disorder ([place])    Calculus of kidney    Depression    Fatty liver    GERD (gastroesophageal reflux disease)    Headache    migraine and sinus   History of kidney stones    Plantar fasciitis    Sleep apnea    uses CPAP   Wears contact lenses    sometimes    Past Surgical History:  Procedure Laterality Date   COLONOSCOPY     COLONOSCOPY WITH PROPOFOL N/A 09/17/2022   Procedure: COLONOSCOPY WITH PROPOFOL;  Surgeon: Lucilla Lame, MD;  Location: [facility];  Service: Endoscopy;  Laterality: N/A;   NASAL SEPTOPLASTY W/ TURBINOPLASTY Bilateral 06/30/2021   Procedure: NASAL SEPTOPLASTY WITH TURBINATE REDUCTION, SUBMUCOUS RESECTION;  Surgeon: Clyde Canterbury, MD;  Location: [facility];  Service: ENT;  Laterality: Bilateral;   REFRACTIVE SURGERY Bilateral 01/2022   WISDOM TOOTH EXTRACTION      Allergies  Allergen Reactions   Shellfish-Derived Products Swelling    Throat Closing   Lactose Diarrhea    Diarrhea   Lactose Intolerance (Gi)     Diarrhea    Milk ([place])     Objective/Physical Exam Neurovascular status intact.  Incision well coapted with sutures intact. No sign of infectious process noted. No dehiscence. No active bleeding noted.  Minimal edema noted to the surgical extremity.  Overall well-healing surgical foot   Assessment: 1. s/p endoscopic plantar fasciotomy bilateral. DOS: 29 2024   -Patient evaluated -Dressings changed.  Recommend Band-Aid over the small skin incisions and Ace wrap daily.  Ace wrap provided -Refill prescription for Percocet 5/'3 2 5 '$ mg Q6H PRN -Continue WBAT  surgical shoes -Return to clinic 1 week suture removal.  Also dispensed compression ankle sleeves   Edrick Kins, DPM [facility]  Dr. Edrick Kins, DPM    [address], [place] [address]                Office [phone]  Fax [phone]

## 2023-02-01 NOTE — Telephone Encounter (Signed)
Medication D/C 09/10/22.

## 2023-02-08 ENCOUNTER — Encounter: Payer: Self-pay | Admitting: Podiatry

## 2023-02-08 ENCOUNTER — Ambulatory Visit (INDEPENDENT_AMBULATORY_CARE_PROVIDER_SITE_OTHER): Payer: 59 | Admitting: Podiatry

## 2023-02-08 VITALS — BP 135/67 | HR 76

## 2023-02-08 DIAGNOSIS — Z9889 Other specified postprocedural states: Secondary | ICD-10-CM

## 2023-02-08 NOTE — Progress Notes (Signed)
   Chief Complaint  Patient presents with   Routine Post Op    "The right foot stitch bleeds off and on.  The right foot is more sore than the left foot."    Subjective:  Patient presents today status post endoscopic plantar fasciotomy.  DOS: 01/27/2023.  Patient doing well.  WBAT surgical shoes bilateral.  No new complaints at this time  Past Medical History:  Diagnosis Date   Bipolar 1 disorder ([place])    Calculus of kidney    Depression    Fatty liver    GERD (gastroesophageal reflux disease)    Headache    migraine and sinus   History of kidney stones    Plantar fasciitis    Sleep apnea    uses CPAP   Wears contact lenses    sometimes    Past Surgical History:  Procedure Laterality Date   COLONOSCOPY     COLONOSCOPY WITH PROPOFOL N/A 09/17/2022   Procedure: COLONOSCOPY WITH PROPOFOL;  Surgeon: Lucilla Lame, MD;  Location: [facility];  Service: Endoscopy;  Laterality: N/A;   NASAL SEPTOPLASTY W/ TURBINOPLASTY Bilateral 06/30/2021   Procedure: NASAL SEPTOPLASTY WITH TURBINATE REDUCTION, SUBMUCOUS RESECTION;  Surgeon: Clyde Canterbury, MD;  Location: [facility];  Service: ENT;  Laterality: Bilateral;   REFRACTIVE SURGERY Bilateral 01/2022   WISDOM TOOTH EXTRACTION      Allergies  Allergen Reactions   Shellfish-Derived Products Swelling    Throat Closing   Lactose Diarrhea    Diarrhea   Lactose Intolerance (Gi)     Diarrhea    Milk ([place])     Objective/Physical Exam Neurovascular status intact.  Incision well coapted with sutures intact. No sign of infectious process noted. No dehiscence. No active bleeding noted.  Minimal edema noted to the surgical extremity.  Overall well-healing surgical foot   Assessment: 1. s/p endoscopic plantar fasciotomy bilateral. DOS: 29 2024   -Patient evaluated - Sutures removed -Compression ankle sleeve dispensed.  Wear daily -Continue WBAT surgical shoes.  Over the next few weeks the patient may slowly begin to  transition out of the surgical shoes into good supportive tennis shoes or sneakers -Return to clinic 4 weeks   Edrick Kins, DPM [facility]  Dr. Edrick Kins, DPM    [address], [place] [address]                Office [phone]  Fax [phone]

## 2023-02-12 ENCOUNTER — Other Ambulatory Visit: Payer: Self-pay | Admitting: Podiatry

## 2023-02-14 ENCOUNTER — Other Ambulatory Visit: Payer: Self-pay | Admitting: Podiatry

## 2023-02-14 ENCOUNTER — Telehealth: Payer: Self-pay

## 2023-02-14 NOTE — Telephone Encounter (Signed)
Refill sent. - Dr. Tanita Palinkas

## 2023-02-15 NOTE — Telephone Encounter (Signed)
Noted, thanks!

## 2023-02-22 ENCOUNTER — Encounter: Payer: 59 | Admitting: Podiatry

## 2023-02-24 ENCOUNTER — Encounter: Payer: Self-pay | Admitting: Podiatry

## 2023-02-28 ENCOUNTER — Encounter: Payer: Self-pay | Admitting: Otolaryngology

## 2023-03-02 NOTE — Anesthesia Preprocedure Evaluation (Addendum)
Anesthesia Evaluation  Patient identified by MRN, date of birth, ID band Patient awake    Reviewed: Allergy & Precautions, H&P , NPO status , Patient's Chart, lab work & pertinent test results  Airway Mallampati: II  TM Distance: >3 FB Neck ROM: Full    Dental no notable dental hx.    Pulmonary sleep apnea and Continuous Positive Airway Pressure Ventilation  Is presently unable to use CPAP, but hopes that this surgery will allow his nose to open and breathe with CPAP   Pulmonary exam normal breath sounds clear to auscultation       Cardiovascular negative cardio ROS Normal cardiovascular exam Rhythm:Regular Rate:Normal     Neuro/Psych  Headaches PSYCHIATRIC DISORDERS Anxiety Depression Bipolar Disorder    negative psych ROS   GI/Hepatic Neg liver ROS,GERD  Controlled and Medicated,,Fatty liver   Endo/Other  negative endocrine ROS    Renal/GU Kidney stones  negative genitourinary   Musculoskeletal negative musculoskeletal ROS (+)  Plantar fasciitis   Abdominal   Peds negative pediatric ROS (+)  Hematology negative hematology ROS (+)   Anesthesia Other Findings Bipolar 1 disorder  Depression Fatty liver  Calculus of kidney Plantar fasciitis  Headache GERD (gastroesophageal reflux disease)History of kidney stones Sleep apnea  Chronic fatigue Back pain Intractable migraines  Reproductive/Obstetrics negative OB ROS                             Anesthesia Physical Anesthesia Plan  ASA: 2  Anesthesia Plan:    Post-op Pain Management:    Induction:   PONV Risk Score and Plan:   Airway Management Planned:   Additional Equipment:   Intra-op Plan:   Post-operative Plan:   Informed Consent:   Plan Discussed with:   Anesthesia Plan Comments:        Anesthesia Quick Evaluation

## 2023-03-07 NOTE — Discharge Instructions (Signed)
[facility] ENDOSCOPIC SINUS SURGERY [facility]  What is Functional Endoscopic Sinus Surgery?  The Surgery involves making the natural openings of the sinuses larger by removing the bony partitions that separate the sinuses from the nasal cavity.  The natural sinus lining is preserved as much as possible to allow the sinuses to resume normal function after the surgery.  In some patients nasal polyps (excessively swollen lining of the sinuses) may be removed to relieve obstruction of the sinus openings.  The surgery is performed through the nose using lighted scopes, which eliminates the need for incisions on the face.  A septoplasty is a different procedure which is sometimes performed with sinus surgery.  It involves straightening the boy partition that separates the two sides of your nose.  A crooked or deviated septum may need repair if is obstructing the sinuses or nasal airflow.  Turbinate reduction is also often performed during sinus surgery.  The turbinates are bony proturberances from the side walls of the nose which swell and can obstruct the nose in patients with sinus and allergy problems.  Their size can be surgically reduced to help relieve nasal obstruction.  What Can Sinus Surgery Do For Me?  Sinus surgery can reduce the frequency of sinus infections requiring antibiotic treatment.  This can provide improvement in nasal congestion, post-nasal drainage, facial pressure and nasal obstruction.  Surgery will NOT prevent you from ever having an infection again, so it usually only for patients who get infections 4 or more times yearly requiring antibiotics, or for infections that do not clear with antibiotics.  It will not cure nasal allergies, so patients with allergies may still require medication to treat their allergies after surgery. Surgery may improve headaches related to sinusitis, however, some people will continue to  require medication to control sinus headaches related to allergies.  Surgery will do nothing for other forms of headache (migraine, tension or cluster).  What Are the Risks of Endoscopic Sinus Surgery?  Current techniques allow surgery to be performed safely with little risk, however, there are rare complications that patients should be aware of.  Because the sinuses are located around the eyes, there is risk of eye injury, including blindness, though again, this would be quite rare. This is usually a result of bleeding behind the eye during surgery, which can effect vision, though there are treatments to protect the vision and prevent permanent injury. More serious complications would include bleeding inside the brain cavity or damage to the brain.This happens when the fluid around the brain leaks out into the sinus cavity.  Again, all of these complications are uncommon, and spinal fluid leaks can be safely managed surgically if they occur.  The most common complication of sinus surgery is bleeding from the nose, which may require packing or cauterization of the nose.  Patients with polyps may experience recurrence of the polyps that would require revision surgery.  Alterations of sense of smell or injury to the tear ducts are also rare complications.   What is the Surgery Like, and what is the Recovery?  The Surgery usually takes a couple of hours to perform, and is usually performed under a general anesthetic (completely asleep).  Patients are usually discharged home after a couple of hours.  Sometimes during surgery it is necessary to pack the nose to control bleeding, and the packing is left in place for 24 - 48 hours, and removed by your surgeon.  If   a septoplasty was performed during the procedure, there is often a splint placed which must be removed after 5-7 days.   Discomfort: Pain is usually mild to moderate, and can be controlled by prescription pain medication or acetaminophen (Tylenol).   Aspirin, Ibuprofen (Advil, Motrin), or Naprosyn (Aleve) should be avoided, as they can cause increased bleeding.  Most patients feel sinus pressure like they have a bad head cold for several days.  Sleeping with your head elevated can help reduce swelling and facial pressure, as can ice packs over the face.  A humidifier may be helpful to keep the mucous and blood from drying in the nose.   Diet: There are no specific diet restrictions, however, you should generally start with clear liquids and a light diet of bland foods because the anesthetic can cause some nausea.  Advance your diet depending on how your stomach feels.  Taking your pain medication with food will often help reduce stomach upset which pain medications can cause.  Nasal Saline Irrigation: It is important to remove blood clots and dried mucous from the nose as it is healing.  This is done by having you irrigate the nose at least 3 - 4 times daily with a salt water solution.  We recommend using NeilMed Sinus Rinse (available at the drug store).  Fill the squeeze bottle with the solution, bend over a sink, and insert the tip of the squeeze bottle into the nose  of an inch.  Point the tip of the squeeze bottle towards the inside corner of the eye on the same side your irrigating.  Squeeze the bottle and gently irrigate the nose.  If you bend forward as you do this, most of the fluid will flow back out of the nose, instead of down your throat.   The solution should be warm, near body temperature, when you irrigate.   Each time you irrigate, you should use a full squeeze bottle.   Note that if you are instructed to use Nasal Steroid Sprays at any time after your surgery, irrigate with saline BEFORE using the steroid spray, so you do not wash it all out of the nose. Another product, Nasal Saline Gel (such as AYR Nasal Saline Gel) can be applied in each nostril 3 - 4 times daily to moisture the nose and reduce scabbing or crusting.  Bleeding:   Bloody drainage from the nose can be expected for several days, and patients are instructed to irrigate their nose frequently with salt water to help remove mucous and blood clots.  The drainage may be dark red or brown, though some fresh blood may be seen intermittently, especially after irrigation.  Do not blow you nose, as bleeding may occur. If you must sneeze, keep your mouth open to allow air to escape through your mouth.  If heavy bleeding occurs: Irrigate the nose with saline to rinse out clots, then spray the nose 3 - 4 times with Afrin Nasal Decongestant Spray.  The spray will constrict the blood vessels to slow bleeding.  Pinch the lower half of your nose shut to apply pressure, and lay down with your head elevated.  Ice packs over the nose may help as well. If bleeding persists despite these measures, you should notify your doctor.  Do not use the Afrin routinely to control nasal congestion after surgery, as it can result in worsening congestion and may affect healing.     Activity: Return to work varies among patients. Most patients will be out   of work at least 5 - 7 days to recover.  Patient may return to work after they are off of narcotic pain medication, and feeling well enough to perform the functions of their job.  Patients must avoid heavy lifting (over 10 pounds) or strenuous physical for 2 weeks after surgery, so your employer may need to assign you to light duty, or keep you out of work longer if light duty is not possible.  NOTE: you should not drive, operate dangerous machinery, do any mentally demanding tasks or make any important legal or financial decisions while on narcotic pain medication and recovering from the general anesthetic.    Call Your Doctor Immediately if You Have Any of the Following: Bleeding that you cannot control with the above measures Loss of vision, double vision, bulging of the eye or black eyes. Fever over 101 degrees Neck stiffness with severe headache,  fever, nausea and change in mental state. You are always encouraged to call anytime with concerns, however, please call with requests for pain medication refills during office hours.  Office Endoscopy: During follow-up visits your doctor will remove any packing or splints that may have been placed and evaluate and clean your sinuses endoscopically.  Topical anesthetic will be used to make this as comfortable as possible, though you may want to take your pain medication prior to the visit.  How often this will need to be done varies from patient to patient.  After complete recovery from the surgery, you may need follow-up endoscopy from time to time, particularly if there is concern of recurrent infection or nasal polyps.  

## 2023-03-08 ENCOUNTER — Other Ambulatory Visit: Payer: Self-pay

## 2023-03-08 ENCOUNTER — Encounter: Payer: Self-pay | Admitting: Otolaryngology

## 2023-03-08 ENCOUNTER — Ambulatory Visit: Payer: 59 | Admitting: Anesthesiology

## 2023-03-08 ENCOUNTER — Ambulatory Visit
Admission: RE | Admit: 2023-03-08 | Discharge: 2023-03-08 | Disposition: A | Discharge status: Home / Self Care | Payer: 59 | Attending: Otolaryngology | Admitting: Otolaryngology

## 2023-03-08 ENCOUNTER — Encounter
Admission: RE | Disposition: A | Discharge status: Home / Self Care | Payer: Self-pay | Source: Home / Self Care | Attending: Otolaryngology

## 2023-03-08 DIAGNOSIS — K219 Gastro-esophageal reflux disease without esophagitis: Secondary | ICD-10-CM | POA: Insufficient documentation

## 2023-03-08 DIAGNOSIS — J310 Chronic rhinitis: Secondary | ICD-10-CM | POA: Insufficient documentation

## 2023-03-08 DIAGNOSIS — F419 Anxiety disorder, unspecified: Secondary | ICD-10-CM | POA: Diagnosis not present

## 2023-03-08 DIAGNOSIS — F319 Bipolar disorder, unspecified: Secondary | ICD-10-CM | POA: Diagnosis not present

## 2023-03-08 DIAGNOSIS — J343 Hypertrophy of nasal turbinates: Secondary | ICD-10-CM | POA: Insufficient documentation

## 2023-03-08 DIAGNOSIS — G4730 Sleep apnea, unspecified: Secondary | ICD-10-CM | POA: Insufficient documentation

## 2023-03-08 DIAGNOSIS — G43919 Migraine, unspecified, intractable, without status migrainosus: Secondary | ICD-10-CM | POA: Insufficient documentation

## 2023-03-08 HISTORY — PX: TURBINATE REDUCTION: SHX6157

## 2023-03-08 SURGERY — REDUCTION, NASAL TURBINATE
Anesthesia: General | Laterality: Left

## 2023-03-08 MED ORDER — HYDROCODONE-ACETAMINOPHEN 5-325 MG PO TABS: 1.0000 | ORAL_TABLET | Freq: Four times a day (QID) | ORAL | 0 refills | Status: DC | PRN

## 2023-03-08 MED ORDER — DEXAMETHASONE SODIUM PHOSPHATE 4 MG/ML IJ SOLN
INTRAMUSCULAR | Status: DC | PRN
  Administered 2023-03-08: 12 mg via INTRAVENOUS

## 2023-03-08 MED ORDER — LIDOCAINE HCL (CARDIAC) PF 100 MG/5ML IV SOSY
PREFILLED_SYRINGE | INTRAVENOUS | Status: DC | PRN
  Administered 2023-03-08: 40 mg via INTRAVENOUS

## 2023-03-08 MED ORDER — LIDOCAINE-EPINEPHRINE 1 %-1:100000 IJ SOLN
INTRAMUSCULAR | Status: DC | PRN
  Administered 2023-03-08: 1 mL

## 2023-03-08 MED ORDER — OXYCODONE HCL 5 MG/5ML PO SOLN: 10.0000 mg | Freq: Once | ORAL | Status: DC

## 2023-03-08 MED ORDER — ONDANSETRON HCL 4 MG/2ML IJ SOLN
INTRAMUSCULAR | Status: DC | PRN
  Administered 2023-03-08: 4 mg via INTRAVENOUS

## 2023-03-08 MED ORDER — OXYMETAZOLINE HCL 0.05 % NA SOLN
NASAL | Status: DC | PRN
  Administered 2023-03-08: 1 via TOPICAL

## 2023-03-08 MED ORDER — STERILE WATER FOR IRRIGATION IR SOLN
Status: DC | PRN
  Administered 2023-03-08: 500 mL

## 2023-03-08 MED ORDER — LACTATED RINGERS IV SOLN: INTRAVENOUS | Status: DC

## 2023-03-08 MED ORDER — SUCCINYLCHOLINE CHLORIDE 200 MG/10ML IV SOSY
PREFILLED_SYRINGE | INTRAVENOUS | Status: DC | PRN
  Administered 2023-03-08: 100 mg via INTRAVENOUS

## 2023-03-08 MED ORDER — ACETAMINOPHEN 10 MG/ML IV SOLN
1000.0000 mg | Freq: Once | INTRAVENOUS | Status: AC
  Administered 2023-03-08: 1000 mg via INTRAVENOUS

## 2023-03-08 MED ORDER — PROPOFOL 10 MG/ML IV BOLUS
INTRAVENOUS | Status: DC | PRN
  Administered 2023-03-08: 200 mg via INTRAVENOUS

## 2023-03-08 MED ORDER — OXYCODONE HCL 5 MG PO TABS
10.0000 mg | ORAL_TABLET | Freq: Once | ORAL | Status: AC
  Administered 2023-03-08: 10 mg via ORAL

## 2023-03-08 MED ORDER — FENTANYL CITRATE (PF) 100 MCG/2ML IJ SOLN
INTRAMUSCULAR | Status: DC | PRN
  Administered 2023-03-08 (×2): 50 ug via INTRAVENOUS

## 2023-03-08 MED ORDER — ROCURONIUM BROMIDE 100 MG/10ML IV SOLN
INTRAVENOUS | Status: DC | PRN
  Administered 2023-03-08: 10 mg via INTRAVENOUS
  Administered 2023-03-08: 25 mg via INTRAVENOUS

## 2023-03-08 MED ORDER — MIDAZOLAM HCL 5 MG/5ML IJ SOLN
INTRAMUSCULAR | Status: DC | PRN
  Administered 2023-03-08: 2 mg via INTRAVENOUS

## 2023-03-08 MED ORDER — SUGAMMADEX SODIUM 200 MG/2ML IV SOLN
INTRAVENOUS | Status: DC | PRN
  Administered 2023-03-08: 200 mg via INTRAVENOUS

## 2023-03-08 SURGICAL SUPPLY — 21 items
CANISTER SUCT 1200ML W/VALVE (MISCELLANEOUS) ×1 IMPLANT
COAG SUCTION FOOTSWITCH 10FR (SUCTIONS) IMPLANT
COAGULATOR SUCT 8FR VV (MISCELLANEOUS) IMPLANT
DRESSING NASL FOAM PST OP SINU (MISCELLANEOUS) IMPLANT
DRSG NASAL FOAM POST OP SINU (MISCELLANEOUS)
ELECT REM PT RETURN 9FT ADLT (ELECTROSURGICAL) ×1
ELECTRODE REM PT RTRN 9FT ADLT (ELECTROSURGICAL) ×1 IMPLANT
GLOVE SURG ENC MOIS LTX SZ7.5 (GLOVE) ×2 IMPLANT
GOWN STRL REUS W/ TWL LRG LVL3 (GOWN DISPOSABLE) ×1 IMPLANT
GOWN STRL REUS W/TWL LRG LVL3 (GOWN DISPOSABLE) ×1
IV NS 500ML (IV SOLUTION) ×1
IV NS 500ML BAXH (IV SOLUTION) ×1 IMPLANT
KIT TURNOVER KIT A (KITS) ×1 IMPLANT
NS IRRIG 500ML POUR BTL (IV SOLUTION) ×1 IMPLANT
PACK ENT CUSTOM (PACKS) ×1 IMPLANT
PACKING NASAL EPIS 4X2.4 XEROG (MISCELLANEOUS) IMPLANT
PATTIES SURGICAL .5 X3 (DISPOSABLE) ×1 IMPLANT
SOL ANTI-FOG 6CC FOG-OUT (MISCELLANEOUS) ×1 IMPLANT
STRAP BODY AND KNEE 60X3 (MISCELLANEOUS) IMPLANT
SYR 10ML LL (SYRINGE) ×1 IMPLANT
WATER STERILE IRR 250ML POUR (IV SOLUTION) IMPLANT

## 2023-03-08 NOTE — Transfer of Care (Signed)
Immediate Anesthesia Transfer of Care Note  Patient: Lance Walsh  Procedure(s) Performed: SUBMUCUSAL MIDDLE TURBINATE REDUCTION (Left)  Patient Location: PACU  Anesthesia Type: No value filed.  Level of Consciousness: awake, alert  and patient cooperative  Airway and Oxygen Therapy: Patient Spontanous Breathing and Patient connected to supplemental oxygen  Post-op Assessment: Post-op Vital signs reviewed, Patient's Cardiovascular Status Stable, Respiratory Function Stable, Patent Airway and No signs of Nausea or vomiting  Post-op Vital Signs: Reviewed and stable  Complications:  Encounter Notable Events  Notable Event Outcome Phase Comment  Difficult to intubate - expected  Intraprocedure Filed from anesthesia note documentation.

## 2023-03-08 NOTE — H&P (Signed)
History and physical reviewed and will be scanned in later. No change in medical status reported by the patient or family, appears stable for surgery. All questions regarding the procedure answered, and patient (or family if a child) expressed understanding of the procedure. ? ?Lance Walsh ?@[email or web address]@ ?

## 2023-03-08 NOTE — Anesthesia Procedure Notes (Signed)
Procedure Name: Intubation Date/Time: 03/08/2023 12:12 PM  Performed by: Jeannene Patella, CRNAPre-anesthesia Checklist: Patient identified, Timeout performed, Emergency Drugs available, Suction available and Patient being monitored Patient Re-evaluated:Patient Re-evaluated prior to induction Oxygen Delivery Method: Circle system utilized Preoxygenation: Pre-oxygenation with 100% oxygen Induction Type: IV induction Ventilation: Mask ventilation with difficulty Laryngoscope Size: Mac and 4 Grade View: Grade III Tube type: Oral Tube size: 7.0 mm Number of attempts: 1 Airway Equipment and Method: Stylet Placement Confirmation: ETT inserted through vocal cords under direct vision, positive ETCO2 and breath sounds checked- equal and bilateral Secured at: 22 (teeth) cm Tube secured with: Tape Dental Injury: Teeth and Oropharynx as per pre-operative assessment  Difficulty Due To: Difficulty was anticipated Future Recommendations: Recommend- induction with short-acting agent, and alternative techniques readily available Comments: Known OSA MP 4

## 2023-03-08 NOTE — Op Note (Signed)
03/08/2023  12:48 PM    Lance Walsh  [id number]   Pre-Op Diagnosis:  Hypertrophy of left nasal inferior turbinate  Post-op Diagnosis: Hypertrophy of  left nasal inferior turbinate  Procedure: Left submucous resection of the inferior turbinate    Surgeon:  Sandi Mealy  Anesthesia:  General endotracheal  EBL:  25 cc  Complications:  None  Findings: Left inferior turbinate hypertrophy  Procedure: After the patient was identified in holding and the benefits of the procedure were reviewed as well as the consent and risks, the patient was taken to the operating room and with the patient in a comfortable supine position,  general orotracheal anesthesia was induced without difficulty.  A proper time-out was performed.    Next 1% Xylocaine with 1:100,000 epinephrine was infiltrated into the left inferior turbinate. Cottonoid pledgets soaked in Afrin were placed into the left nasal cavity and left while the patient was prepped and draped in the standard fashion.   On the left-hand side, a 15 blade was used to incise along the inferior edge of the inferior turbinate. A superior laterally based flap of the medial turbinate mucosa was then elevated. A portion of the underlying conchal bone and lateral mucosa was excised using Knight scissors. Further debridement of excessive turbinate tissue was then accomplished with thru-cutting forceps, using the endoscope for visualization. There was polypoid edema of the posterior turbinate, and this was resected. The flap was then laid back over the turbinate stump and the bleeding edge of the turbinate cauterized using suction cautery. The turbinate was out-fractured for further reduction.   The nose was suctioned and inspected. Bleeding appeared well controlled.   The patient was then returned to the anesthesiologist for awakening and taken to recovery room in good condition postoperatively.  Disposition:   PACU and d/c home  Plan: Ice, elevation,  narcotic analgesia. Begin nasal  irrigations with saline tomorrow, irrigating 3-4 times daily. Return to the office in 2-3 weeks.  Return to work in 7-10 days, no strenuous activities for two weeks.   Lance Walsh 03/08/2023 12:48 PM

## 2023-03-08 NOTE — Anesthesia Postprocedure Evaluation (Signed)
Anesthesia Post Note  Patient: MANFRED PREVATTE  Procedure(s) Performed: SUBMUCUSAL MIDDLE TURBINATE REDUCTION (Left)  Patient location during evaluation: PACU Anesthesia Type: General Level of consciousness: awake and alert Pain management: pain level controlled Vital Signs Assessment: post-procedure vital signs reviewed and stable Respiratory status: spontaneous breathing, nonlabored ventilation, respiratory function stable and patient connected to nasal cannula oxygen Cardiovascular status: blood pressure returned to baseline and stable Postop Assessment: no apparent nausea or vomiting Anesthetic complications: yes   Encounter Notable Events  Notable Event Outcome Phase Comment  Difficult to intubate - expected  Intraprocedure Filed from anesthesia note documentation.     Last Vitals:  Vitals:   03/08/23 1315 03/08/23 1322  BP: (!) 119/91 116/85  Pulse: 70 70  Resp: 11 11  Temp:  (!) 36.3 C  SpO2: 96% 95%    Last Pain:  Vitals:   03/08/23 1315  TempSrc:   PainSc: Asleep                 Thomes Burak C Jakie Debow

## 2023-03-09 ENCOUNTER — Encounter: Payer: Self-pay | Admitting: Otolaryngology

## 2023-03-10 LAB — SURGICAL PATHOLOGY

## 2023-03-15 ENCOUNTER — Encounter: Payer: Self-pay | Admitting: Podiatry

## 2023-03-15 ENCOUNTER — Ambulatory Visit (INDEPENDENT_AMBULATORY_CARE_PROVIDER_SITE_OTHER): Payer: 59 | Admitting: Podiatry

## 2023-03-15 VITALS — BP 116/73 | HR 76

## 2023-03-15 DIAGNOSIS — Z9889 Other specified postprocedural states: Secondary | ICD-10-CM

## 2023-03-15 NOTE — Progress Notes (Signed)
   Chief Complaint  Patient presents with   Routine Post Op    "The right one is hurting me but the left one is doing pretty darn good."    Subjective:  Patient presents today status post endoscopic plantar fasciotomy.  DOS: 01/27/2023.  Patient doing well.  He is WBAT in good supportive tennis shoes.  He has had some pain and tenderness onset about 1 week ago to the right Achilles tendon.  He says over the past week he has been resting the foot and ankle and it has improved.  Past Medical History:  Diagnosis Date   Bipolar 1 disorder    Calculus of kidney    Depression    Fatty liver    GERD (gastroesophageal reflux disease)    Headache    migraine and sinus   History of kidney stones    Plantar fasciitis    Sleep apnea    not using CPAP    Past Surgical History:  Procedure Laterality Date   COLONOSCOPY     COLONOSCOPY WITH PROPOFOL N/A 09/17/2022   Procedure: COLONOSCOPY WITH PROPOFOL;  Surgeon: Midge Minium, MD;  Location: [facility];  Service: Endoscopy;  Laterality: N/A;   NASAL SEPTOPLASTY W/ TURBINOPLASTY Bilateral 06/30/2021   Procedure: NASAL SEPTOPLASTY WITH TURBINATE REDUCTION, SUBMUCOUS RESECTION;  Surgeon: Geanie Logan, MD;  Location: [facility];  Service: ENT;  Laterality: Bilateral;   REFRACTIVE SURGERY Bilateral 01/2022   TURBINATE REDUCTION Left 03/08/2023   Procedure: SUBMUCUSAL MIDDLE TURBINATE REDUCTION;  Surgeon: Geanie Logan, MD;  Location: [facility];  Service: ENT;  Laterality: Left;   WISDOM TOOTH EXTRACTION      Allergies  Allergen Reactions   Shellfish-Derived Products Swelling    Throat Closing   Lactose Intolerance (Gi) Diarrhea         Milk (Cow)     Objective/Physical Exam Neurovascular status intact.  Incisions healed.  Minimal tenderness along palpation of the incision sites.  There is some slight tenderness along the insertion of the Achilles tendon right lower extremity  Assessment: 1. s/p endoscopic  plantar fasciotomy bilateral. DOS: 01/27/2023 2.  Mild Achilles tendinitis RLE x 1 week; improving  -Patient evaluated -Continue slowly increasing activity and good supportive tennis shoes and sneakers -Continue compression ankle sleeve -Refrain from work until next follow-up -Continue Motrin 800 mg BID  -Return to clinic 6 weeks  Felecia Shelling, DPM [facility]  Dr. Felecia Shelling, DPM    [address] [place], [place] [address]                Office [phone]  Fax [phone]

## 2023-03-18 ENCOUNTER — Telehealth: Payer: Self-pay | Admitting: *Deleted

## 2023-04-13 ENCOUNTER — Ambulatory Visit: Payer: 59 | Admitting: Nurse Practitioner

## 2023-04-13 ENCOUNTER — Encounter: Payer: Self-pay | Admitting: Nurse Practitioner

## 2023-04-13 VITALS — BP 108/69 | HR 73 | Temp 98.9°F | Wt 186.2 lb

## 2023-04-13 DIAGNOSIS — E7800 Pure hypercholesterolemia, unspecified: Secondary | ICD-10-CM | POA: Diagnosis not present

## 2023-04-13 DIAGNOSIS — G8929 Other chronic pain: Secondary | ICD-10-CM

## 2023-04-13 DIAGNOSIS — F419 Anxiety disorder, unspecified: Secondary | ICD-10-CM | POA: Diagnosis not present

## 2023-04-13 DIAGNOSIS — F3342 Major depressive disorder, recurrent, in full remission: Secondary | ICD-10-CM

## 2023-04-13 DIAGNOSIS — M545 Low back pain, unspecified: Secondary | ICD-10-CM | POA: Diagnosis not present

## 2023-04-13 MED ORDER — MELOXICAM 15 MG PO TABS: 15.0000 mg | ORAL_TABLET | Freq: Every day | ORAL | 2 refills | Status: DC

## 2023-04-13 MED ORDER — EPINEPHRINE 0.3 MG/0.3ML IJ SOAJ: 0.3000 mg | INTRAMUSCULAR | 1 refills | Status: AC | PRN

## 2023-04-13 NOTE — Progress Notes (Signed)
BP 108/69   Pulse 73   Temp 98.9 F (37.2 C) (Oral)   Wt 186 lb 3.2 oz (84.5 kg)   SpO2 100%   BMI 26.72 kg/m    Subjective:    Patient ID: Lance Walsh, male    DOB: 05-Jul-1976, 47 y.o.   MRN: [id number]  HPI: Lance Walsh is a 47 y.o. male  Chief Complaint  Patient presents with   Anxiety   Depression   HYPERLIPIDEMIA Hyperlipidemia status: excellent compliance Satisfied with current treatment?  yes Side effects:  no Medication compliance: excellent compliance Past cholesterol meds: none Supplements: none Aspirin:  no The 10-year ASCVD risk score (Arnett DK, et al., 2019) is: 2.5%   Values used to calculate the score:     Age: 61 years     Sex: Male     Is Non-Hispanic African American: No     Diabetic: No     Tobacco smoker: No     Systolic Blood Pressure: 108 mmHg     Is BP treated: No     HDL Cholesterol: 50 mg/dL     Total Cholesterol: 241 mg/dL Chest pain:  no Coronary artery disease:  no Family history CAD:  no Family history early CAD:  no  MOOD Patient he feels like his stress and anxiety has been worse due to recent surgeries.  He has had recent surgeries on his foot.  Denies SI.     Flowsheet Row Office Visit from 04/13/2023 in [facility]  PHQ-9 Total Score 2         04/13/2023    3:14 PM 10/13/2022    3:02 PM 07/22/2022    2:05 PM 05/25/2022    2:59 PM  GAD 7 : Generalized Anxiety Score  Nervous, Anxious, on Edge 0 0 1 2  Control/stop worrying 0 0 1 2  Worry too much - different things 1 0 1 2  Trouble relaxing 1 0 1 2  Restless 0 0 1 2  Easily annoyed or irritable 1 0 1 1  Afraid - awful might happen 1 0 1 2  Total GAD 7 Score 4 0 7 13  Anxiety Difficulty Somewhat difficult Not difficult at all Somewhat difficult Very difficult    Relevant past medical, surgical, family and social history reviewed and updated as indicated. Interim medical history since our last visit reviewed. Allergies and medications  reviewed and updated.  Review of Systems  Eyes:  Negative for visual disturbance.  Respiratory:  Negative for shortness of breath.   Cardiovascular:  Negative for chest pain and leg swelling.  Neurological:  Negative for light-headedness and headaches.  Psychiatric/Behavioral:  Positive for dysphoric mood. Negative for suicidal ideas. The patient is nervous/anxious.     Per HPI unless specifically indicated above     Objective:    BP 108/69   Pulse 73   Temp 98.9 F (37.2 C) (Oral)   Wt 186 lb 3.2 oz (84.5 kg)   SpO2 100%   BMI 26.72 kg/m   Wt Readings from Last 3 Encounters:  04/13/23 186 lb 3.2 oz (84.5 kg)  03/08/23 191 lb (86.6 kg)  11/26/22 197 lb 14.4 oz (89.8 kg)    Physical Exam Vitals and nursing note reviewed.  Constitutional:      General: He is not in acute distress.    Appearance: Normal appearance. He is not ill-appearing, toxic-appearing or diaphoretic.  HENT:     Head: Normocephalic.  Right Ear: External ear normal.     Left Ear: External ear normal.     Nose: Nose normal. No congestion or rhinorrhea.     Mouth/Throat:     Mouth: Mucous membranes are moist.  Eyes:     General:        Right eye: No discharge.        Left eye: No discharge.     Extraocular Movements: Extraocular movements intact.     Conjunctiva/sclera: Conjunctivae normal.     Pupils: Pupils are equal, round, and reactive to light.  Cardiovascular:     Rate and Rhythm: Normal rate and regular rhythm.     Heart sounds: No murmur heard. Pulmonary:     Effort: Pulmonary effort is normal. No respiratory distress.     Breath sounds: Normal breath sounds. No wheezing, rhonchi or rales.  Abdominal:     General: Abdomen is flat. Bowel sounds are normal.  Musculoskeletal:     Cervical back: Normal range of motion and neck supple.  Skin:    General: Skin is warm and dry.     Capillary Refill: Capillary refill takes less than 2 seconds.  Neurological:     General: No focal deficit  present.     Mental Status: He is alert and oriented to person, place, and time.  Psychiatric:        Mood and Affect: Mood normal.        Behavior: Behavior normal.        Thought Content: Thought content normal.        Judgment: Judgment normal.     Results for orders placed or performed during the hospital encounter of 03/08/23  Surgical pathology  Result Value Ref Range   SURGICAL PATHOLOGY      SURGICAL PATHOLOGY CASE: [id number] PATIENT: Deirdre Evener Surgical Pathology Report     Specimen Submitted: A. Nasal turbinates, left  Clinical History: Hypertrophy of nasal turbinates      DIAGNOSIS: A. NASAL TURBINATES, LEFT, RESECTION: - BENIGN SINONASAL MUCOSA WITH INCREASED EOSINOPHILS AND LYMPHOCYTES.   GROSS DESCRIPTION: A. Labeled: Left nasal turbinate's Received: Formalin Collection time: 12:22 PM on 03/08/2023 Placed into formalin time: 12:22 PM on 03/08/2023 Tissue fragment(s): 2 Size: Range from 0.6-1.5 cm Description: Received are 2 fragments of white-tan pink soft tissue. The larger fragment is bisected. Entirely submitted in 1 cassette.  RB 03/09/2023  Final Diagnosis performed by Redmond Pulling, MD.   Electronically signed 03/10/2023 9:07:30AM The electronic signature indicates that the named Attending Pathologist has evaluated the specimen Technical component performed at [facility], [address] [place], [place] [address] Lab: [phone] Dir: Jolene Schimke, MD, MMM  Professional component performed at [facility], [address] [place], [place], [place] [address] Lab: [phone] Dir: Beryle Quant, MD       Assessment & Plan:   Problem List Items Addressed This Visit       Other   Depression    Chronic.  Controlled.  Continue without medication.  Labs ordered today.  Return to clinic in 6 months for reevaluation.  Call sooner if concerns arise.        Relevant Orders   Comp Met (CMET)   Elevated low density  lipoprotein (LDL) cholesterol level    Labs ordered at visit today.  Will make recommendations based on lab results.        Relevant Orders   Lipid Profile   Chronic back pain    Has been on  Meloxicam.  Has tried to come off of it before but has a lot of pain without the medication.  Aware of the risks of taking long term NSAID.  Working with pain management to find alternative medication.       Relevant Medications   meloxicam (MOBIC) 15 MG tablet   Anxiety - Primary    Chronic.  Controlled.  Continue without medication.  Labs ordered today.  Return to clinic in 6 months for reevaluation.  Call sooner if concerns arise.       Relevant Orders   Comp Met (CMET)     Follow up plan: Return in about 6 months (around 10/14/2023) for Physical and Fasting labs (After November 15).

## 2023-04-13 NOTE — Assessment & Plan Note (Signed)
Chronic.  Controlled.  Continue without medication.  Labs ordered today.  Return to clinic in 6 months for reevaluation.  Call sooner if concerns arise.

## 2023-04-13 NOTE — Telephone Encounter (Signed)
"  I was seen by Dr. Logan Bores this week.  He extended my leave for six weeks.  Who do I need to speak to regarding this?"  (This may be an old message, Marylene Land.)

## 2023-04-13 NOTE — Assessment & Plan Note (Signed)
Labs ordered at visit today.  Will make recommendations based on lab results.   

## 2023-04-13 NOTE — Assessment & Plan Note (Signed)
Has been on Meloxicam.  Has tried to come off of it before but has a lot of pain without the medication.  Aware of the risks of taking long term NSAID.  Working with pain management to find alternative medication.

## 2023-04-13 NOTE — Assessment & Plan Note (Signed)
Chronic.  Controlled.  Continue without medication.  Labs ordered today.  Return to clinic in 6 months for reevaluation.  Call sooner if concerns arise.  

## 2023-04-14 LAB — COMPREHENSIVE METABOLIC PANEL
ALT: 20 IU/L (ref 0–44)
AST: 16 IU/L (ref 0–40)
Albumin/Globulin Ratio: 2.1 (ref 1.2–2.2)
Albumin: 4.5 g/dL (ref 4.1–5.1)
Alkaline Phosphatase: 71 IU/L (ref 44–121)
BUN/Creatinine Ratio: 17 (ref 9–20)
BUN: 15 mg/dL (ref 6–24)
Bilirubin Total: 0.6 mg/dL (ref 0.0–1.2)
CO2: 23 mmol/L (ref 20–29)
Calcium: 9.5 mg/dL (ref 8.7–10.2)
Chloride: 101 mmol/L (ref 96–106)
Creatinine, Ser: 0.88 mg/dL (ref 0.76–1.27)
Globulin, Total: 2.1 g/dL (ref 1.5–4.5)
Glucose: 103 mg/dL — ABNORMAL HIGH (ref 70–99)
Potassium: 3.9 mmol/L (ref 3.5–5.2)
Sodium: 138 mmol/L (ref 134–144)
Total Protein: 6.6 g/dL (ref 6.0–8.5)
eGFR: 107 mL/min/{1.73_m2} (ref >59)

## 2023-04-14 LAB — LIPID PANEL
Chol/HDL Ratio: 4.7 ratio (ref 0.0–5.0)
Cholesterol, Total: 220 mg/dL — ABNORMAL HIGH (ref 100–199)
HDL: 47 mg/dL (ref >39)
LDL Chol Calc (NIH): 152 mg/dL — ABNORMAL HIGH (ref 0–99)
Triglycerides: 115 mg/dL (ref 0–149)
VLDL Cholesterol Cal: 21 mg/dL (ref 5–40)

## 2023-04-14 NOTE — Progress Notes (Signed)
Hi Lance Walsh. It was nice to see you yesterday.  Your lab work looks good.  Your liver kidneys and electrolytes look good.  Your total cholesterol and Triglycerides have improved from prior.  Your LDL (bad cholesterol) is still elevated.  Continue with your diet changed.  No concerns at this time. Continue with your current medication regimen.  Follow up as discussed.  Please let me know if you have any questions.

## 2023-04-26 ENCOUNTER — Encounter: Payer: Self-pay | Admitting: *Deleted

## 2023-04-26 ENCOUNTER — Telehealth: Payer: Self-pay | Admitting: Urology

## 2023-04-26 ENCOUNTER — Ambulatory Visit (INDEPENDENT_AMBULATORY_CARE_PROVIDER_SITE_OTHER): Payer: 59 | Admitting: Podiatry

## 2023-04-26 ENCOUNTER — Encounter: Payer: Self-pay | Admitting: Nurse Practitioner

## 2023-04-26 ENCOUNTER — Telehealth: Payer: Self-pay | Admitting: *Deleted

## 2023-04-26 DIAGNOSIS — Z9889 Other specified postprocedural states: Secondary | ICD-10-CM

## 2023-04-26 NOTE — Telephone Encounter (Signed)
"  I was there to see Dr. Logan Bores this morning.  I got a note saying I could return to work on 05/02/2023.  I was told if I needed more information on the letter to call.  I want to have the date to return to work to be changed to tomorrow, 04/27/2023.  I also need it to say, no climbing stairs, I need to have the ability to rest, and no excessive heavy lifting."  I will send it via MyChart.  I am unable to send the fax via MyChart.  Do you have a fax number I can send it to?  "You can fax it to my job.  I'll call and get the fax number and call you back."  "I have the fax number for you.  It is [phone]."  Whose attention do I need to send it to?  "You can send it to [employer]."

## 2023-04-26 NOTE — Progress Notes (Signed)
   Chief Complaint  Patient presents with   Plantar Fasciitis    Patient came in today for bilateral foot surgery, patient states that the heel and arches are sore, rate of pain 5 out of 10, in the morning the pain is sore,     Subjective:  Patient presents today status post endoscopic plantar fasciotomy.  DOS: 01/27/2023.  Patient doing well.  He has been weightbearing in tennis shoes and slowly increasing his activity.  He says that he did try to wear a pair of steel toed boots since last visit but he was only able to wear them for about 1-2 hours and he began to experience some pain.  Presenting for follow-up  Past Medical History:  Diagnosis Date   Bipolar 1 disorder (HCC)    Calculus of kidney    Depression    Fatty liver    GERD (gastroesophageal reflux disease)    Headache    migraine and sinus   History of kidney stones    Plantar fasciitis    Sleep apnea    not using CPAP    Past Surgical History:  Procedure Laterality Date   COLONOSCOPY     COLONOSCOPY WITH PROPOFOL N/A 09/17/2022   Procedure: COLONOSCOPY WITH PROPOFOL;  Surgeon: Midge Minium, MD;  Location: [facility];  Service: Endoscopy;  Laterality: N/A;   NASAL SEPTOPLASTY W/ TURBINOPLASTY Bilateral 06/30/2021   Procedure: NASAL SEPTOPLASTY WITH TURBINATE REDUCTION, SUBMUCOUS RESECTION;  Surgeon: Geanie Logan, MD;  Location: [facility];  Service: ENT;  Laterality: Bilateral;   REFRACTIVE SURGERY Bilateral 01/2022   TURBINATE REDUCTION Left 03/08/2023   Procedure: SUBMUCUSAL MIDDLE TURBINATE REDUCTION;  Surgeon: Geanie Logan, MD;  Location: [facility];  Service: ENT;  Laterality: Left;   WISDOM TOOTH EXTRACTION      Allergies  Allergen Reactions   Shellfish-Derived Products Swelling    Throat Closing   Lactose Intolerance (Gi) Diarrhea         Milk (Cow)     Objective/Physical Exam Neurovascular status intact.  Incisions healed.  Minimal tenderness along palpation of the  incision sites.  Muscle strength 5/5 all compartments no appreciable tenderness along the insertion of the Achilles tendon right lower extremity  Assessment: 1. s/p endoscopic plantar fasciotomy bilateral. DOS: 01/27/2023 2.  Mild Achilles tendinitis RLE x 1 week; improved 3.  Random intermittent numbness with paresthesia right fourth and fifth toes  -Patient evaluated -Continue slowly increasing activity and good supportive tennis shoes and sneakers -Continue compression ankle sleeve Note for work was provided today.  Return to work 05/09/2023 light duty only x 1 month -Order placed for physical therapy at [facility] -Continue Motrin 800 mg BID PRN - OTC prefabricated PowerStep insoles were dispensed.  Continue to wear daily.  His old pair has worn out  -Return to clinic 6 weeks  Felecia Shelling, DPM [facility]  Dr. Felecia Shelling, DPM    [address] [place], [place] [address]                Office [phone]  Fax [phone]

## 2023-04-26 NOTE — Telephone Encounter (Signed)
Patient sent mychart request asking about having CT done to check growth of renal cyst. I called him to clarify and he said he thought these needed to be done yearly. Please advise patient.

## 2023-04-26 NOTE — Telephone Encounter (Signed)
I attempted to fax the letter as requested.  I'm not sure that it reached the destination.  So I have routed the letter to the patient.

## 2023-04-27 ENCOUNTER — Telehealth: Payer: Self-pay | Admitting: *Deleted

## 2023-04-27 NOTE — Telephone Encounter (Signed)
I called Lance Walsh to see if he received the letter yesterday.  He said they received it.  He said he was on the phone with his boss when it went through.

## 2023-06-04 ENCOUNTER — Other Ambulatory Visit: Payer: Self-pay

## 2023-06-04 ENCOUNTER — Ambulatory Visit: Admit: 2023-06-04 | Discharge status: Absent without leave | Payer: 59

## 2023-06-04 ENCOUNTER — Emergency Department
Admission: EM | Admit: 2023-06-04 | Discharge: 2023-06-04 | Disposition: A | Discharge status: Home / Self Care | Payer: 59 | Attending: Emergency Medicine | Admitting: Emergency Medicine

## 2023-06-04 ENCOUNTER — Encounter: Payer: Self-pay | Admitting: Emergency Medicine

## 2023-06-04 ENCOUNTER — Emergency Department: Payer: 59

## 2023-06-04 DIAGNOSIS — K625 Hemorrhage of anus and rectum: Secondary | ICD-10-CM

## 2023-06-04 DIAGNOSIS — K529 Noninfective gastroenteritis and colitis, unspecified: Secondary | ICD-10-CM | POA: Insufficient documentation

## 2023-06-04 LAB — BASIC METABOLIC PANEL
Anion gap: 8 (ref 5–15)
BUN: 24 mg/dL — ABNORMAL HIGH (ref 6–20)
CO2: 28 mmol/L (ref 22–32)
Calcium: 8.9 mg/dL (ref 8.9–10.3)
Chloride: 104 mmol/L (ref 98–111)
Creatinine, Ser: 1.15 mg/dL (ref 0.61–1.24)
GFR, Estimated: >60 mL/min (ref >60)
Glucose, Bld: 97 mg/dL (ref 70–99)
Potassium: 3.7 mmol/L (ref 3.5–5.1)
Sodium: 140 mmol/L (ref 135–145)

## 2023-06-04 LAB — CBC WITH DIFFERENTIAL/PLATELET
Abs Immature Granulocytes: 0.03 10*3/uL (ref 0.00–0.07)
Basophils Absolute: 0 10*3/uL (ref 0.0–0.1)
Basophils Relative: 0 %
Eosinophils Absolute: 0.3 10*3/uL (ref 0.0–0.5)
Eosinophils Relative: 3 %
HCT: 45.8 % (ref 39.0–52.0)
Hemoglobin: 15.3 g/dL (ref 13.0–17.0)
Immature Granulocytes: 0 %
Lymphocytes Relative: 19 %
Lymphs Abs: 1.9 10*3/uL (ref 0.7–4.0)
MCH: 29.3 pg (ref 26.0–34.0)
MCHC: 33.4 g/dL (ref 30.0–36.0)
MCV: 87.7 fL (ref 80.0–100.0)
Monocytes Absolute: 0.7 10*3/uL (ref 0.1–1.0)
Monocytes Relative: 7 %
Neutro Abs: 7.1 10*3/uL (ref 1.7–7.7)
Neutrophils Relative %: 71 %
Platelets: 247 10*3/uL (ref 150–400)
RBC: 5.22 MIL/uL (ref 4.22–5.81)
RDW: 12.3 % (ref 11.5–15.5)
WBC: 10 10*3/uL (ref 4.0–10.5)
nRBC: 0 % (ref 0.0–0.2)

## 2023-06-04 LAB — TYPE AND SCREEN
ABO/RH(D): O POS
Antibody Screen: NEGATIVE

## 2023-06-04 LAB — CBC
HCT: 44.3 % (ref 39.0–52.0)
Hemoglobin: 15 g/dL (ref 13.0–17.0)
MCH: 29.9 pg (ref 26.0–34.0)
MCHC: 33.9 g/dL (ref 30.0–36.0)
MCV: 88.2 fL (ref 80.0–100.0)
Platelets: 216 10*3/uL (ref 150–400)
RBC: 5.02 MIL/uL (ref 4.22–5.81)
RDW: 12.3 % (ref 11.5–15.5)
WBC: 9.9 10*3/uL (ref 4.0–10.5)
nRBC: 0 % (ref 0.0–0.2)

## 2023-06-04 LAB — HEPATIC FUNCTION PANEL
ALT: 18 U/L (ref 0–44)
AST: 15 U/L (ref 15–41)
Albumin: 3.7 g/dL (ref 3.5–5.0)
Alkaline Phosphatase: 54 U/L (ref 38–126)
Bilirubin, Direct: 0.1 mg/dL (ref 0.0–0.2)
Indirect Bilirubin: 0.7 mg/dL (ref 0.3–0.9)
Total Bilirubin: 0.8 mg/dL (ref 0.3–1.2)
Total Protein: 6.2 g/dL — ABNORMAL LOW (ref 6.5–8.1)

## 2023-06-04 LAB — LIPASE, BLOOD: Lipase: 38 U/L (ref 11–51)

## 2023-06-04 MED ORDER — HYDROCODONE-ACETAMINOPHEN 5-325 MG PO TABS: 1.0000 | ORAL_TABLET | Freq: Four times a day (QID) | ORAL | 0 refills | Status: DC | PRN

## 2023-06-04 MED ORDER — SODIUM CHLORIDE 0.9 % IV SOLN
2.0000 g | Freq: Once | INTRAVENOUS | Status: AC
  Administered 2023-06-04: 2 g via INTRAVENOUS
  Filled 2023-06-04: qty 20

## 2023-06-04 MED ORDER — ONDANSETRON 4 MG PO TBDP: 4.0000 mg | ORAL_TABLET | Freq: Three times a day (TID) | ORAL | 0 refills | Status: DC | PRN

## 2023-06-04 MED ORDER — METRONIDAZOLE 500 MG PO TABS
500.0000 mg | ORAL_TABLET | Freq: Once | ORAL | Status: AC
  Administered 2023-06-04: 500 mg via ORAL
  Filled 2023-06-04: qty 1

## 2023-06-04 MED ORDER — CIPROFLOXACIN HCL 500 MG PO TABS: 500.0000 mg | ORAL_TABLET | Freq: Two times a day (BID) | ORAL | 0 refills | Status: DC

## 2023-06-04 MED ORDER — MORPHINE SULFATE (PF) 4 MG/ML IV SOLN
4.0000 mg | Freq: Once | INTRAVENOUS | Status: AC
  Administered 2023-06-04: 4 mg via INTRAVENOUS
  Filled 2023-06-04: qty 1

## 2023-06-04 MED ORDER — IOHEXOL 300 MG/ML  SOLN
100.0000 mL | Freq: Once | INTRAMUSCULAR | Status: AC | PRN
  Administered 2023-06-04: 100 mL via INTRAVENOUS

## 2023-06-04 MED ORDER — HYDROCODONE-ACETAMINOPHEN 5-325 MG PO TABS: 1.0000 | ORAL_TABLET | Freq: Four times a day (QID) | ORAL | 0 refills | Status: AC | PRN

## 2023-06-04 MED ORDER — METRONIDAZOLE 500 MG PO TABS: 500.0000 mg | ORAL_TABLET | Freq: Three times a day (TID) | ORAL | 0 refills | Status: DC

## 2023-06-04 MED ORDER — CIPROFLOXACIN HCL 500 MG PO TABS: 500.0000 mg | ORAL_TABLET | Freq: Two times a day (BID) | ORAL | 0 refills | Status: AC

## 2023-06-04 MED ORDER — ONDANSETRON HCL 4 MG/2ML IJ SOLN
4.0000 mg | Freq: Once | INTRAMUSCULAR | Status: AC
  Administered 2023-06-04: 4 mg via INTRAVENOUS
  Filled 2023-06-04: qty 2

## 2023-06-04 MED ORDER — METRONIDAZOLE 500 MG PO TABS: 500.0000 mg | ORAL_TABLET | Freq: Three times a day (TID) | ORAL | 0 refills | Status: AC

## 2023-06-04 NOTE — Discharge Instructions (Addendum)
Take the antibiotics as prescribed  Eat a low-fat, lactose-free diet until follow-up  Avoid foods high in fat or spicy foods  Take the antibiotics as prescribed

## 2023-06-04 NOTE — ED Triage Notes (Signed)
Pt to ED via POV for rectal bleeding x 2 days. Pt states that bleeding was worse this morning. Pt states that the blood is in the toilet, in his stool, and on the tissue when he wipes. Pt reports he has had 10 episodes of diarrhea. Pt states that it is painful to have a bowel movement. Pt states that he had a colonoscopy this year and it was normal. Pt states that since he had the colonoscopy he has had rectal itching. Pt also having pain in the left side of his abdomen. Pt is in NAD.

## 2023-06-04 NOTE — ED Provider Notes (Signed)
[facility] Provider Note    Event Date/Time   First MD Initiated Contact with Patient 06/04/23 1008     (approximate)   History   Rectal Bleeding   HPI  Lance Walsh is a 47 y.o. male  here with rectal bleeding. Pt reports that over the past day he has had grossly bloody diarrhea. Started as non bloody diarrhea yesterday along with abdominal cramping and pain. He ate at a cookout and thought the food tasted a little off so he thought it was that. This morning, he began having grossly bloody BMs so presents for evaluation. No fevers. No anticoagulant use.       Physical Exam   Triage Vital Signs: ED Triage Vitals [06/04/23 0924]  Enc Vitals Group     BP 118/84     Pulse Rate 78     Resp 16     Temp 98.3 F (36.8 C)     Temp Source Oral     SpO2 99 %     Weight 180 lb (81.6 kg)     Height 5\' 10"  (1.778 m)     Head Circumference      Peak Flow      Pain Score 6     Pain Loc      Pain Edu?      Excl. in GC?     Most recent vital signs: Vitals:   06/04/23 1328 06/04/23 1330  BP: 114/78 111/81  Pulse: 67 64  Resp: 15   Temp:    SpO2: 100% 100%     General: Awake, no distress.  CV:  Good peripheral perfusion.  Resp:  Normal work of breathing.  Abd:  No distention. Mild LLQ TTP. No rebound or guarding. Grossly bloody stool noted in rectal vault Other:  MMM   ED Results / Procedures / Treatments   Labs (all labs ordered are listed, but only abnormal results are displayed) Labs Reviewed  BASIC METABOLIC PANEL - Abnormal; Notable for the following components:      Result Value   BUN 24 (*)    All other components within normal limits  HEPATIC FUNCTION PANEL - Abnormal; Notable for the following components:   Total Protein 6.2 (*)    All other components within normal limits  GASTROINTESTINAL PANEL BY PCR, STOOL (REPLACES STOOL CULTURE)  C DIFFICILE QUICK SCREEN W PCR REFLEX    CALPROTECTIN, FECAL  CBC WITH  DIFFERENTIAL/PLATELET  LIPASE, BLOOD  CBC  TYPE AND SCREEN     EKG    RADIOLOGY CT A/P: Transverse/descending colitis   I also independently reviewed and agree with radiologist interpretations.   PROCEDURES:  Critical Care performed: No   MEDICATIONS ORDERED IN ED: Medications  iohexol (OMNIPAQUE) 300 MG/ML solution 100 mL (100 mLs Intravenous Contrast Given 06/04/23 1239)  morphine (PF) 4 MG/ML injection 4 mg (4 mg Intravenous Given 06/04/23 1329)  ondansetron (ZOFRAN) injection 4 mg (4 mg Intravenous Given 06/04/23 1328)  cefTRIAXone (ROCEPHIN) 2 g in sodium chloride 0.9 % 100 mL IVPB (2 g Intravenous New Bag/Given 06/04/23 1405)  metroNIDAZOLE (FLAGYL) tablet 500 mg (500 mg Oral Given 06/04/23 1410)     IMPRESSION / MDM / ASSESSMENT AND PLAN / ED COURSE  I reviewed the triage vital signs and the nursing notes.                              Differential diagnosis  includes, but is not limited to, colitis, diverticulitis, food-borne illness, ischemic colitis, internal hemorrhoid bleeding  Patient's presentation is most consistent with acute presentation with potential threat to life or bodily function.  47 yo well appearing male here with diarrhea and now bloody stools. H/o internal hemorrhoids. On exam, pt is well appearing and in NAD though he does have grossly bloody stool. No leukocytosis noted. CMP unremarkable with normal LFTs and renal function. Lipase is normal. CT scan obtained, reviewed, shows colitis w/o perforation or complication. He has no pain, normal AG, no signs to suggest significant ischemic colitis or risk factors for this. Discussed case with Dr. Norma Fredrickson of GI. CBC repeated and pt has stable Hgb even with fluids in ED. Will place on empiric abx, have him f/u as outpt. Return precautions given. He is o/w low risk from a bleeding perspective and HDS.     FINAL CLINICAL IMPRESSION(S) / ED DIAGNOSES   Final diagnoses:  Colitis  Rectal bleeding     Rx / DC  Orders   ED Discharge Orders          Ordered    ciprofloxacin (CIPRO) 500 MG tablet  2 times daily,   Status:  Discontinued        06/04/23 1446    metroNIDAZOLE (FLAGYL) 500 MG tablet  3 times daily,   Status:  Discontinued        06/04/23 1446    HYDROcodone-acetaminophen (NORCO/VICODIN) 5-325 MG tablet  Every 6 hours PRN,   Status:  Discontinued        06/04/23 1446    ciprofloxacin (CIPRO) 500 MG tablet  2 times daily        06/04/23 1527    HYDROcodone-acetaminophen (NORCO/VICODIN) 5-325 MG tablet  Every 6 hours PRN        06/04/23 1527    metroNIDAZOLE (FLAGYL) 500 MG tablet  3 times daily        06/04/23 1527    ondansetron (ZOFRAN-ODT) 4 MG disintegrating tablet  Every 8 hours PRN        06/04/23 1527             Note:  This document was prepared using Dragon voice recognition software and may include unintentional dictation errors.   Shaune Pollack, MD 06/04/23 [phone]

## 2023-06-04 NOTE — ED Notes (Signed)
Pt. Up to toilet, unable to move his bowels. Pt. Acknowledges that when he has BM, he can call for RN and he will receive discharge.

## 2023-06-04 NOTE — ED Notes (Signed)
ED Provider at bedside. 

## 2023-06-04 NOTE — ED Notes (Signed)
Pt. Unable to have BM, notified this RN that he would like to be discharged. This RN notified provider. Ok'd pt. To be discharged without fecal testing. Pt. Will follow up with GI early this coming week.

## 2023-06-14 ENCOUNTER — Encounter: Payer: Self-pay | Admitting: Podiatry

## 2023-06-14 ENCOUNTER — Ambulatory Visit (INDEPENDENT_AMBULATORY_CARE_PROVIDER_SITE_OTHER): Payer: 59 | Admitting: Podiatry

## 2023-06-14 VITALS — BP 113/79 | HR 58

## 2023-06-14 DIAGNOSIS — M7751 Other enthesopathy of right foot: Secondary | ICD-10-CM | POA: Diagnosis not present

## 2023-06-14 DIAGNOSIS — M722 Plantar fascial fibromatosis: Secondary | ICD-10-CM | POA: Diagnosis not present

## 2023-06-14 MED ORDER — BETAMETHASONE SOD PHOS & ACET 6 (3-3) MG/ML IJ SUSP
3.0000 mg | Freq: Once | INTRAMUSCULAR | Status: AC
Start: 2023-06-14 — End: 2023-06-14
  Administered 2023-06-14: 3 mg via INTRA_ARTICULAR

## 2023-06-14 NOTE — Progress Notes (Signed)
   Chief Complaint  Patient presents with   Routine Post Op    "It's alright, it's getting better."    Subjective:  Patient presents today status post endoscopic plantar fasciotomy.  DOS: 01/27/2023.  Patient doing well.  He is back to work essentially full activity with no restrictions.  He has been going to physical therapy but he is now completed it.  He says that he did have some slight pain and tenderness associated to the lateral aspect of the right ankle joint throughout physical therapy.  The pain has not completely resolved.  Presenting for further treatment and evaluation  Past Medical History:  Diagnosis Date   Bipolar 1 disorder (HCC)    Calculus of kidney    Depression    Fatty liver    GERD (gastroesophageal reflux disease)    Headache    migraine and sinus   History of kidney stones    Plantar fasciitis    Sleep apnea    not using CPAP    Past Surgical History:  Procedure Laterality Date   COLONOSCOPY     COLONOSCOPY WITH PROPOFOL N/A 09/17/2022   Procedure: COLONOSCOPY WITH PROPOFOL;  Surgeon: Midge Minium, MD;  Location: [facility];  Service: Endoscopy;  Laterality: N/A;   NASAL SEPTOPLASTY W/ TURBINOPLASTY Bilateral 06/30/2021   Procedure: NASAL SEPTOPLASTY WITH TURBINATE REDUCTION, SUBMUCOUS RESECTION;  Surgeon: Geanie Logan, MD;  Location: [facility];  Service: ENT;  Laterality: Bilateral;   REFRACTIVE SURGERY Bilateral 01/2022   TURBINATE REDUCTION Left 03/08/2023   Procedure: SUBMUCUSAL MIDDLE TURBINATE REDUCTION;  Surgeon: Geanie Logan, MD;  Location: [facility];  Service: ENT;  Laterality: Left;   WISDOM TOOTH EXTRACTION      Allergies  Allergen Reactions   Shellfish-Derived Products Swelling    Throat Closing   Lactose Intolerance (Gi) Diarrhea         Milk (Cow)     Objective/Physical Exam Neurovascular status intact.  Incisions healed.  No tenderness to palpation along the plantar fascia bilateral.  There is some  slight tenderness to the anterolateral aspect of the right ankle.  Muscle strength 5/5 all compartments   Assessment: 1. s/p endoscopic plantar fasciotomy bilateral. DOS: 01/27/2023 2.  Capsulitis right ankle  -Patient evaluated -From a postsurgical standpoint the patient may resume full activity no restrictions.  His heels feel well and the numbness to the toes has resolved completely. -Throughout physical therapy he has complained of some pain and tenderness associated to the lateral aspect of the right ankle although there is no history of injury.  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 Felecia Shelling, DPM    [address] [place], [place] [address]                Office [phone]  Fax [phone]

## 2023-06-30 ENCOUNTER — Other Ambulatory Visit: Payer: Self-pay | Admitting: Nurse Practitioner

## 2023-07-01 ENCOUNTER — Ambulatory Visit: Payer: 59

## 2023-07-01 DIAGNOSIS — K640 First degree hemorrhoids: Secondary | ICD-10-CM | POA: Diagnosis not present

## 2023-07-01 DIAGNOSIS — K2970 Gastritis, unspecified, without bleeding: Secondary | ICD-10-CM | POA: Diagnosis not present

## 2023-07-01 DIAGNOSIS — R933 Abnormal findings on diagnostic imaging of other parts of digestive tract: Secondary | ICD-10-CM | POA: Diagnosis not present

## 2023-07-01 DIAGNOSIS — K209 Esophagitis, unspecified without bleeding: Secondary | ICD-10-CM | POA: Diagnosis not present

## 2023-07-01 DIAGNOSIS — K259 Gastric ulcer, unspecified as acute or chronic, without hemorrhage or perforation: Secondary | ICD-10-CM | POA: Diagnosis not present

## 2023-07-01 DIAGNOSIS — R197 Diarrhea, unspecified: Secondary | ICD-10-CM | POA: Diagnosis not present

## 2023-07-01 DIAGNOSIS — K921 Melena: Secondary | ICD-10-CM | POA: Diagnosis not present

## 2023-07-01 DIAGNOSIS — K2289 Other specified disease of esophagus: Secondary | ICD-10-CM | POA: Diagnosis not present

## 2023-07-01 NOTE — Telephone Encounter (Signed)
Requested medication (s) are due for refill today - no  Requested medication (s) are on the active medication list -no  Future visit scheduled -no  Last refill: 10/13/22  Notes to clinic: non delegated Rx, no longer listed on current medication list  Requested Prescriptions  Pending Prescriptions Disp Refills   clonazePAM (KLONOPIN) 1 MG tablet [Pharmacy Med Name: CLONAZEPAM 1 MG TABLET] 15 tablet 0    Sig: Take 1 tablet (1 mg total) by mouth daily.     Not Delegated - Psychiatry: Anxiolytics/Hypnotics 2 Failed - 06/30/2023  3:30 PM      Failed - This refill cannot be delegated      Failed - Urine Drug Screen completed in last 360 days      Passed - Patient is not pregnant      Passed - Valid encounter within last 6 months    Recent Outpatient Visits           2 months ago Anxiety   [facility] Larae Grooms, NP   7 months ago Acute maxillary sinusitis, recurrence not specified   [place] [facility] Mecum, Oswaldo Conroy, PA-C   8 months ago Annual physical exam   [place] [facility] Larae Grooms, NP   11 months ago Prostate cancer screening   [place] [facility] Gabriel Cirri, NP   1 year ago Bilateral sciatica   [facility] Gabriel Cirri, NP                 Requested Prescriptions  Pending Prescriptions Disp Refills   clonazePAM (KLONOPIN) 1 MG tablet [Pharmacy Med Name: CLONAZEPAM 1 MG TABLET] 15 tablet 0    Sig: Take 1 tablet (1 mg total) by mouth daily.     Not Delegated - Psychiatry: Anxiolytics/Hypnotics 2 Failed - 06/30/2023  3:30 PM      Failed - This refill cannot be delegated      Failed - Urine Drug Screen completed in last 360 days      Passed - Patient is not pregnant      Passed - Valid encounter within last 6 months    Recent Outpatient Visits           2 months ago Anxiety   [place] [facility] Larae Grooms, NP   7  months ago Acute maxillary sinusitis, recurrence not specified   [place] [facility] Mecum, Oswaldo Conroy, PA-C   8 months ago Annual physical exam   [place] [facility] Larae Grooms, NP   11 months ago Prostate cancer screening   [place] [facility] Gabriel Cirri, NP   1 year ago Bilateral sciatica   [place] [facility] Gabriel Cirri, NP

## 2023-08-09 ENCOUNTER — Ambulatory Visit: Payer: 59 | Admitting: Family Medicine

## 2023-08-09 VITALS — BP 112/79 | HR 75 | Temp 98.4°F | Wt 184.8 lb

## 2023-08-09 DIAGNOSIS — J0100 Acute maxillary sinusitis, unspecified: Secondary | ICD-10-CM | POA: Diagnosis not present

## 2023-08-09 MED ORDER — AMOXICILLIN-POT CLAVULANATE 875-125 MG PO TABS: 1.0000 | ORAL_TABLET | Freq: Two times a day (BID) | ORAL | 0 refills | Status: DC

## 2023-08-09 MED ORDER — PROMETHAZINE-DM 6.25-15 MG/5ML PO SYRP: 5.0000 mL | ORAL_SOLUTION | Freq: Four times a day (QID) | ORAL | 0 refills | Status: DC | PRN

## 2023-08-09 MED ORDER — AMOXICILLIN-POT CLAVULANATE 875-125 MG PO TABS: 1.0000 | ORAL_TABLET | Freq: Two times a day (BID) | ORAL | 0 refills | Status: AC

## 2023-08-09 NOTE — Patient Instructions (Addendum)
Recommend warm liquids such as hot tea and soup - Increased rest - Increasing Fluids - Acetaminophen as needed for fever/pain.  - Salt water gargling, chloraseptic spray and throat lozenges - Mucinex.  - Humidifying the air.

## 2023-08-09 NOTE — Assessment & Plan Note (Signed)
Acute, ongoing. Augmentin BID for 7 days, Promethazine-DM for cough as needed. Recommend  - Increased rest - Increasing Fluids and warm fluids (hot tea and soup) - Acetaminophen as needed for fever/pain.  - Salt water gargling, chloraseptic spray and throat lozenges - Mucinex.  - Humidifying the air.

## 2023-08-09 NOTE — Progress Notes (Signed)
BP 112/79   Pulse 75   Temp 98.4 F (36.9 C) (Oral)   Wt 184 lb 12.8 oz (83.8 kg)   SpO2 98%   BMI 26.52 kg/m    Subjective:    Patient ID: Lance Walsh, male    DOB: [birth year], 47 y.o.   MRN: [id number]  HPI: Lance Walsh is a 47 y.o. male  Chief Complaint  Patient presents with   sinus congestion   SINUSITIS Symptoms started 1 week ago, worsened at night, difficulty sleeping at night due to coughing, throat irritation, and chest congestion. He complains of multiple nights waking up in sweats. He feels his symptoms are worsening. Worst symptom: Head pain, throat, and congestion Fever: no Cough: yes with yellow mucus Shortness of breath: no Wheezing: no Chest pain:no Chest tightness: no Chest congestion: yes Nasal congestion: yes; green mucus Runny nose: no Post nasal drip: yes Sneezing: no Sore throat: yes Swollen glands: no  Sinus pressure: yes Headache: yes Face pain: yes Toothache: yes right jaw Ear pain: No Ear pressure: yes right sided Eyes red/itching:no Eye drainage/crusting: no  Vomiting: no Rash: no Fatigue: no Sick contacts: no Strep contacts: no  Context: better, worse, stable, and fluctuating Recurrent sinusitis: no Relief with OTC cold/cough medications: no  Treatments attempted: Mucinex, antihistamine, Nyquil and Dayquil, throat lozenges, uses saline rinse through nose x3 daily d/t sinus surgery  Relevant past medical, surgical, family and social history reviewed and updated as indicated. Interim medical history since our last visit reviewed. Allergies and medications reviewed and updated.  Review of Systems  Constitutional:  Negative for chills, fatigue and fever.  HENT:  Positive for congestion, postnasal drip, rhinorrhea, sinus pressure, sinus pain, sneezing and sore throat. Negative for ear pain.   Eyes:  Negative for discharge, redness and itching.  Respiratory:  Positive for cough. Negative for chest tightness, shortness of breath  and wheezing.   Cardiovascular:  Negative for chest pain.  Gastrointestinal:  Negative for vomiting.  Musculoskeletal:  Negative for arthralgias.  Skin:  Negative for rash.  Neurological:  Positive for headaches.    Per HPI unless specifically indicated above     Objective:    BP 112/79   Pulse 75   Temp 98.4 F (36.9 C) (Oral)   Wt 184 lb 12.8 oz (83.8 kg)   SpO2 98%   BMI 26.52 kg/m   Wt Readings from Last 3 Encounters:  08/09/23 184 lb 12.8 oz (83.8 kg)  06/04/23 180 lb (81.6 kg)  04/13/23 186 lb 3.2 oz (84.5 kg)    Physical Exam Vitals and nursing note reviewed.  Constitutional:      General: He is not in acute distress.    Appearance: Normal appearance. He is not ill-appearing, toxic-appearing or diaphoretic.  HENT:     Head: Normocephalic and atraumatic.     Right Ear: Tympanic membrane, ear canal and external ear normal. Tenderness present. There is no impacted cerumen.     Left Ear: Tympanic membrane, ear canal and external ear normal. No tenderness. There is no impacted cerumen.     Nose: Congestion and rhinorrhea present.     Right Turbinates: Enlarged and swollen.     Left Turbinates: Enlarged and swollen.     Right Sinus: Maxillary sinus tenderness present.     Left Sinus: Maxillary sinus tenderness present.     Mouth/Throat:     Mouth: Mucous membranes are moist.     Pharynx: Oropharynx is clear. Posterior oropharyngeal erythema and  postnasal drip present. No oropharyngeal exudate.  Eyes:     General: No scleral icterus.       Right eye: No discharge.        Left eye: No discharge.     Extraocular Movements: Extraocular movements intact.     Conjunctiva/sclera: Conjunctivae normal.     Pupils: Pupils are equal, round, and reactive to light.  Neck:     Vascular: No carotid bruit.  Cardiovascular:     Rate and Rhythm: Normal rate and regular rhythm.     Pulses: Normal pulses.          Radial pulses are 2+ on the right side and 2+ on the left side.        Posterior tibial pulses are 2+ on the right side and 2+ on the left side.     Heart sounds: Normal heart sounds. No murmur heard.    No friction rub. No gallop.  Pulmonary:     Effort: Pulmonary effort is normal. No respiratory distress.     Breath sounds: Normal breath sounds. No stridor. No wheezing, rhonchi or rales.  Chest:     Chest wall: No tenderness.  Musculoskeletal:        General: Normal range of motion.     Cervical back: Normal range of motion and neck supple. No rigidity. No muscular tenderness.  Lymphadenopathy:     Cervical: No cervical adenopathy.  Skin:    General: Skin is warm and dry.     Capillary Refill: Capillary refill takes less than 2 seconds.     Coloration: Skin is not jaundiced or pale.     Findings: No bruising, erythema, lesion or rash.  Neurological:     General: No focal deficit present.     Mental Status: He is alert and oriented to person, place, and time. Mental status is at baseline.     Cranial Nerves: No cranial nerve deficit.     Sensory: No sensory deficit.     Motor: No weakness.     Coordination: Coordination normal.     Gait: Gait normal.     Deep Tendon Reflexes: Reflexes normal.  Psychiatric:        Mood and Affect: Mood normal.        Behavior: Behavior normal.        Thought Content: Thought content normal.        Judgment: Judgment normal.     Results for orders placed or performed during the hospital encounter of 06/04/23  CBC with Differential  Result Value Ref Range   WBC 10.0 4.0 - 10.5 K/uL   RBC 5.22 4.22 - 5.81 MIL/uL   Hemoglobin 15.3 13.0 - 17.0 g/dL   HCT 78.2 95.6 - 21.3 %   MCV 87.7 80.0 - 100.0 fL   MCH 29.3 26.0 - 34.0 pg   MCHC 33.4 30.0 - 36.0 g/dL   RDW 08.6 57.8 - 46.9 %   Platelets 247 150 - 400 K/uL   nRBC 0.0 0.0 - 0.2 %   Neutrophils Relative % 71 %   Neutro Abs 7.1 1.7 - 7.7 K/uL   Lymphocytes Relative 19 %   Lymphs Abs 1.9 0.7 - 4.0 K/uL   Monocytes Relative 7 %   Monocytes Absolute 0.7  0.1 - 1.0 K/uL   Eosinophils Relative 3 %   Eosinophils Absolute 0.3 0.0 - 0.5 K/uL   Basophils Relative 0 %   Basophils Absolute 0.0 0.0 - 0.1 K/uL  Immature Granulocytes 0 %   Abs Immature Granulocytes 0.03 0.00 - 0.07 K/uL  Basic metabolic panel  Result Value Ref Range   Sodium 140 135 - 145 mmol/L   Potassium 3.7 3.5 - 5.1 mmol/L   Chloride 104 98 - 111 mmol/L   CO2 28 22 - 32 mmol/L   Glucose, Bld 97 70 - 99 mg/dL   BUN 24 (H) 6 - 20 mg/dL   Creatinine, Ser 6.04 0.61 - 1.24 mg/dL   Calcium 8.9 8.9 - 54.0 mg/dL   GFR, Estimated >98 >11 mL/min   Anion gap 8 5 - 15  Hepatic function panel  Result Value Ref Range   Total Protein 6.2 (L) 6.5 - 8.1 g/dL   Albumin 3.7 3.5 - 5.0 g/dL   AST 15 15 - 41 U/L   ALT 18 0 - 44 U/L   Alkaline Phosphatase 54 38 - 126 U/L   Total Bilirubin 0.8 0.3 - 1.2 mg/dL   Bilirubin, Direct 0.1 0.0 - 0.2 mg/dL   Indirect Bilirubin 0.7 0.3 - 0.9 mg/dL  Lipase, blood  Result Value Ref Range   Lipase 38 11 - 51 U/L  CBC  Result Value Ref Range   WBC 9.9 4.0 - 10.5 K/uL   RBC 5.02 4.22 - 5.81 MIL/uL   Hemoglobin 15.0 13.0 - 17.0 g/dL   HCT 91.4 78.2 - 95.6 %   MCV 88.2 80.0 - 100.0 fL   MCH 29.9 26.0 - 34.0 pg   MCHC 33.9 30.0 - 36.0 g/dL   RDW 21.3 08.6 - 57.8 %   Platelets 216 150 - 400 K/uL   nRBC 0.0 0.0 - 0.2 %  Type and screen [facility]  Result Value Ref Range   ABO/RH(D) O POS    Antibody Screen NEG    Sample Expiration      06/07/2023,2359 Performed at [facility] : [facility], [address] [place], [place] [address]       Assessment & Plan:   Problem List Items Addressed This Visit     Acute maxillary sinusitis - Primary    Acute, ongoing. Augmentin BID for 7 days, Promethazine-DM for cough as needed. Recommend  - Increased rest - Increasing Fluids and warm fluids (hot tea and soup) - Acetaminophen as needed for fever/pain.  - Salt water gargling, chloraseptic spray and throat lozenges - Mucinex.   - Humidifying the air.      Relevant Medications   promethazine-dextromethorphan (PROMETHAZINE-DM) 6.25-15 MG/5ML syrup   amoxicillin-clavulanate (AUGMENTIN) 875-125 MG tablet     Follow up plan: Return in about 2 months (around 10/09/2023), or if symptoms worsen or fail to improve, for physical.

## 2023-09-14 ENCOUNTER — Encounter: Payer: 59 | Admitting: Nurse Practitioner

## 2023-09-30 ENCOUNTER — Telehealth: Payer: Self-pay

## 2023-09-30 NOTE — Telephone Encounter (Signed)
Patient called and left a message on voicemail at 9:22am and states he is having some abdominal pain and wants to make a appointment. Patient saw [facility] on 06/30/2023 and is a establish patient with them and needs to call them to make a appointment. He states he though he called them to schedule informed him no but gave him the number so he could call there office

## 2023-10-12 ENCOUNTER — Ambulatory Visit: Payer: 59 | Admitting: Podiatry

## 2023-10-14 ENCOUNTER — Ambulatory Visit (INDEPENDENT_AMBULATORY_CARE_PROVIDER_SITE_OTHER): Payer: 59 | Admitting: Podiatry

## 2023-10-14 ENCOUNTER — Encounter: Payer: Self-pay | Admitting: Podiatry

## 2023-10-14 VITALS — Ht 70.0 in | Wt 184.8 lb

## 2023-10-14 DIAGNOSIS — M722 Plantar fascial fibromatosis: Secondary | ICD-10-CM

## 2023-10-14 DIAGNOSIS — M7751 Other enthesopathy of right foot: Secondary | ICD-10-CM

## 2023-10-14 DIAGNOSIS — Z9889 Other specified postprocedural states: Secondary | ICD-10-CM | POA: Diagnosis not present

## 2023-10-14 MED ORDER — METHYLPREDNISOLONE 4 MG PO TBPK: ORAL_TABLET | ORAL | 0 refills | Status: DC

## 2023-10-14 NOTE — Progress Notes (Signed)
Chief Complaint  Patient presents with   Foot Pain    Pt is here due to sore feet, states he is on his feet a lot due to work. Pain has been going on for a few months. Here for injections.    Subjective:  Patient h/o endoscopic plantar fasciotomy.  DOS: 01/27/2023 presenting for evaluation of pain and tenderness to the bilateral heels.  Patient states that he has been working double shifts at work.  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  He has noticed increased pain and tenderness to the bilateral heels.  He says that overall there is improvement with the plantar fasciotomy surgery but he is having pain simply because he is on his feet for such a prolonged period of time.  Past Medical History:  Diagnosis Date   Bipolar 1 disorder (HCC)    Calculus of kidney    Depression    Fatty liver    GERD (gastroesophageal reflux disease)    Headache    migraine and sinus   History of kidney stones    Plantar fasciitis    Sleep apnea    not using CPAP    Past Surgical History:  Procedure Laterality Date   COLONOSCOPY     COLONOSCOPY WITH PROPOFOL N/A 09/17/2022   Procedure: COLONOSCOPY WITH PROPOFOL;  Surgeon: Midge Minium, MD;  Location: [facility];  Service: Endoscopy;  Laterality: N/A;   NASAL SEPTOPLASTY W/ TURBINOPLASTY Bilateral 06/30/2021   Procedure: NASAL SEPTOPLASTY WITH TURBINATE REDUCTION, SUBMUCOUS RESECTION;  Surgeon: Geanie Logan, MD;  Location: [facility];  Service: ENT;  Laterality: Bilateral;   REFRACTIVE SURGERY Bilateral 01/2022   TURBINATE REDUCTION Left 03/08/2023   Procedure: SUBMUCUSAL MIDDLE TURBINATE REDUCTION;  Surgeon: Geanie Logan, MD;  Location: [facility];  Service: ENT;  Laterality: Left;   WISDOM TOOTH EXTRACTION      Allergies  Allergen Reactions   Shellfish-Derived Products Swelling    Throat Closing   Lactose Intolerance (Gi) Diarrhea         Milk (Cow)     Objective/Physical Exam Physical Exam General:  The patient is alert and oriented x3 in no acute distress.  Dermatology: Skin is cool, dry and supple bilateral lower extremities. Negative for open lesions or macerations.  Vascular: Palpable pedal pulses bilaterally. No edema or erythema noted. Capillary refill within normal limits.  Neurological: Grossly intact via light touch  Musculoskeletal Exam: Tenderness with palpation along the plantar medial aspect of the bilateral heels.  Consistent with recalcitrant plantar fasciitis   Assessment: 1. H/o endoscopic plantar fasciotomy bilateral. DOS: 01/27/2023 2.  Residual plantar fasciitis bilateral  -Patient evaluated -Injection of 0.5 cc Celestone Soluspan injected into the bilateral plantar fascia -Prescription for Medrol Dosepak, then resume meloxicam as needed -Today the patient was molded for custom molded orthotics.  This should help support the medial longitudinal arch of his foot and alleviate heel pain and pressure.  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 Felecia Shelling, DPM    [address]  [place], [place] [address]                Office [phone]  Fax [phone]

## 2023-10-17 NOTE — Progress Notes (Signed)
Patient impressions taken and scanned for L3000 UCB orthotics  Items to be fit when in  Patient was seen, measured / scanned for custom molded foot orthotics.  Patient will benefit from CFO's as they will help provide total contact to MLA's helping to better distribute body weight across BIL feet greater reducing plantar pressure and pain and to also encourage FF and RF alignment.  Patient was scanned items to be ordered and fit when in  [employer], CFo, CFm

## 2023-10-18 ENCOUNTER — Encounter: Payer: 59 | Admitting: Nurse Practitioner

## 2023-10-24 ENCOUNTER — Ambulatory Visit (INDEPENDENT_AMBULATORY_CARE_PROVIDER_SITE_OTHER): Payer: 59 | Admitting: Nurse Practitioner

## 2023-10-24 ENCOUNTER — Encounter: Payer: Self-pay | Admitting: Nurse Practitioner

## 2023-10-24 VITALS — BP 120/68 | HR 82 | Temp 98.6°F | Ht 69.5 in | Wt 182.2 lb

## 2023-10-24 DIAGNOSIS — Z23 Encounter for immunization: Secondary | ICD-10-CM | POA: Diagnosis not present

## 2023-10-24 DIAGNOSIS — F419 Anxiety disorder, unspecified: Secondary | ICD-10-CM | POA: Diagnosis not present

## 2023-10-24 DIAGNOSIS — E7800 Pure hypercholesterolemia, unspecified: Secondary | ICD-10-CM

## 2023-10-24 DIAGNOSIS — Z0000 Encounter for general adult medical examination without abnormal findings: Secondary | ICD-10-CM | POA: Diagnosis not present

## 2023-10-24 LAB — URINALYSIS, ROUTINE W REFLEX MICROSCOPIC
Bilirubin, UA: NEGATIVE
Glucose, UA: NEGATIVE
Ketones, UA: NEGATIVE
Leukocytes,UA: NEGATIVE
Nitrite, UA: NEGATIVE
Protein,UA: NEGATIVE
RBC, UA: NEGATIVE
Specific Gravity, UA: 1.02 (ref 1.005–1.030)
Urobilinogen, Ur: 0.2 mg/dL (ref 0.2–1.0)
pH, UA: 7 (ref 5.0–7.5)

## 2023-10-24 NOTE — Assessment & Plan Note (Signed)
New referral placed for patient to see psychiatry. Patient does take Clonazepam PRN for anxiety.

## 2023-10-24 NOTE — Progress Notes (Signed)
BP 120/68 (BP Location: Left Arm, Patient Position: Sitting, Cuff Size: Normal)   Pulse 82   Temp 98.6 F (37 C) (Oral)   Ht 5' 9.5" (1.765 m)   Wt 182 lb 3.2 oz (82.6 kg)   SpO2 98%   BMI 26.52 kg/m    Subjective:    Patient ID: Lance Walsh, male    DOB: May 05, 1976, 47 y.o.   MRN: [id number]  HPI: Lance Walsh is a 47 y.o. male presenting on 10/24/2023 for comprehensive medical examination. Current medical complaints include:none  He currently lives with: Interim Problems from his last visit: no   Depression Screen done today and results listed below:     10/24/2023    9:02 AM 04/13/2023    3:13 PM 10/13/2022    3:02 PM 07/22/2022    2:04 PM 05/25/2022    2:58 PM  Depression screen PHQ 2/9  Decreased Interest 0 0 0 1 0  Down, Depressed, Hopeless 0 0 0 0 0  PHQ - 2 Score 0 0 0 1 0  Altered sleeping 0 1 0 0 1  Tired, decreased energy 0 1 0 2 3  Change in appetite 0 0 0 1 1  Feeling bad or failure about yourself  0 0 0 0 0  Trouble concentrating 0 0 0 0 0  Moving slowly or fidgety/restless 0 0 0 0 0  Suicidal thoughts 0 0 0 0 0  PHQ-9 Score 0 2 0 4 5  Difficult doing work/chores  Somewhat difficult Not difficult at all Somewhat difficult Very difficult    The patient does not have a history of falls. I did complete a risk assessment for falls. A plan of care for falls was documented.   Past Medical History:  Past Medical History:  Diagnosis Date   Bipolar 1 disorder (HCC)    Calculus of kidney    Depression    Fatty liver    GERD (gastroesophageal reflux disease)    Headache    migraine and sinus   History of kidney stones    Plantar fasciitis    Sleep apnea    not using CPAP    Surgical History:  Past Surgical History:  Procedure Laterality Date   COLONOSCOPY     COLONOSCOPY WITH PROPOFOL N/A 09/17/2022   Procedure: COLONOSCOPY WITH PROPOFOL;  Surgeon: Midge Minium, MD;  [facility];  Service: Endoscopy;  Laterality: N/A;   NASAL  SEPTOPLASTY W/ TURBINOPLASTY Bilateral 06/30/2021   Procedure: NASAL SEPTOPLASTY WITH TURBINATE REDUCTION, SUBMUCOUS RESECTION;  Surgeon: Geanie Logan, MD;  Location: [facility];  Service: ENT;  Laterality: Bilateral;   REFRACTIVE SURGERY Bilateral 01/2022   TURBINATE REDUCTION Left 03/08/2023   Procedure: SUBMUCUSAL MIDDLE TURBINATE REDUCTION;  Surgeon: Geanie Logan, MD;  Location: [facility];  Service: ENT;  Laterality: Left;   WISDOM TOOTH EXTRACTION      Medications:  Current Outpatient Medications on File Prior to Visit  Medication Sig   EPINEPHrine 0.3 mg/0.3 mL IJ SOAJ injection Inject 0.3 mg into the muscle as needed for anaphylaxis.   Erenumab-aooe 140 MG/ML SOAJ Inject into the skin every 30 (thirty) days. Takes on the 12th   fluorometholone (FML) 0.1 % ophthalmic suspension    HYDROcodone-acetaminophen (NORCO/VICODIN) 5-325 MG tablet Take 1 tablet by mouth every 6 (six) hours as needed for moderate pain or severe pain.   meloxicam (MOBIC) 15 MG tablet Take 1 tablet (15 mg total) by mouth daily.   ondansetron (  ZOFRAN-ODT) 4 MG disintegrating tablet Take 1 tablet (4 mg total) by mouth every 8 (eight) hours as needed for nausea or vomiting.   Perfluorohexyloctane (MIEBO OP) Apply to eye 2 (two) times daily.   RESTASIS 0.05 % ophthalmic emulsion Place 1 drop into both eyes 4 (four) times daily.   SUMAtriptan (IMITREX) 100 MG tablet Take 1 tablet (100 mg total) by mouth daily as needed for migraine or headache. Maximum once a day only. Not to exceed 1 tab in 24 hrs.   No current facility-administered medications on file prior to visit.    Allergies:  Allergies  Allergen Reactions   Shellfish-Derived Products Swelling    Throat Closing   Lactose Intolerance (Gi) Diarrhea         Milk (Cow)     Social History:  Social History   Socioeconomic History   Marital status: Married    Spouse name: Not on file   Number of children: Not on file   Years of  education: Not on file   Highest education level: Not on file  Occupational History   Not on file  Tobacco Use   Smoking status: Never   Smokeless tobacco: Never  Vaping Use   Vaping status: Never Used  Substance and Sexual Activity   Alcohol use: Not Currently    Alcohol/week: 2.0 standard drinks of alcohol    Types: 2 Shots of liquor per week    Comment: only on the weekends   Drug use: No   Sexual activity: Not on file  Other Topics Concern   Not on file  Social History Narrative   Not on file   Social Determinants of Health   Financial Resource Strain: Not on file  Food Insecurity: Not on file  Transportation Needs: Not on file  Physical Activity: Not on file  Stress: Not on file  Social Connections: Not on file  Intimate Partner Violence: Not on file   Social History   Tobacco Use  Smoking Status Never  Smokeless Tobacco Never   Social History   Substance and Sexual Activity  Alcohol Use Not Currently   Alcohol/week: 2.0 standard drinks of alcohol   Types: 2 Shots of liquor per week   Comment: only on the weekends    Family History:  Family History  Problem Relation Age of Onset   Alcohol abuse Mother    Diabetes Mother    Cancer Mother    Diabetes Sister    Cancer Sister     Past medical history, surgical history, medications, allergies, family history and social history reviewed with patient today and changes made to appropriate areas of the chart.   Review of Systems  Psychiatric/Behavioral:  The patient is nervous/anxious.    All other ROS negative except what is listed above and in the HPI.      Objective:    BP 120/68 (BP Location: Left Arm, Patient Position: Sitting, Cuff Size: Normal)   Pulse 82   Temp 98.6 F (37 C) (Oral)   Ht 5' 9.5" (1.765 m)   Wt 182 lb 3.2 oz (82.6 kg)   SpO2 98%   BMI 26.52 kg/m   Wt Readings from Last 3 Encounters:  10/24/23 182 lb 3.2 oz (82.6 kg)  10/14/23 184 lb 12.8 oz (83.8 kg)  08/09/23 184 lb  12.8 oz (83.8 kg)    Physical Exam Vitals and nursing note reviewed.  Constitutional:      General: He is not in acute distress.  Appearance: Normal appearance. He is not ill-appearing, toxic-appearing or diaphoretic.  HENT:     Head: Normocephalic.     Right Ear: Tympanic membrane, ear canal and external ear normal.     Left Ear: Tympanic membrane, ear canal and external ear normal.     Nose: Nose normal. No congestion or rhinorrhea.     Mouth/Throat:     Mouth: Mucous membranes are moist.  Eyes:     General:        Right eye: No discharge.        Left eye: No discharge.     Extraocular Movements: Extraocular movements intact.     Conjunctiva/sclera: Conjunctivae normal.     Pupils: Pupils are equal, round, and reactive to light.  Cardiovascular:     Rate and Rhythm: Normal rate and regular rhythm.     Heart sounds: No murmur heard. Pulmonary:     Effort: Pulmonary effort is normal. No respiratory distress.     Breath sounds: Normal breath sounds. No wheezing, rhonchi or rales.  Abdominal:     General: Abdomen is flat. Bowel sounds are normal. There is no distension.     Palpations: Abdomen is soft.     Tenderness: There is no abdominal tenderness. There is no guarding.  Musculoskeletal:     Cervical back: Normal range of motion and neck supple.  Skin:    General: Skin is warm and dry.     Capillary Refill: Capillary refill takes less than 2 seconds.  Neurological:     General: No focal deficit present.     Mental Status: He is alert and oriented to person, place, and time.     Cranial Nerves: No cranial nerve deficit.     Motor: No weakness.     Deep Tendon Reflexes: Reflexes normal.  Psychiatric:        Mood and Affect: Mood normal.        Behavior: Behavior normal.        Thought Content: Thought content normal.        Judgment: Judgment normal.     Results for orders placed or performed during the hospital encounter of 06/04/23  CBC with Differential  Result  Value Ref Range   WBC 10.0 4.0 - 10.5 K/uL   RBC 5.22 4.22 - 5.81 MIL/uL   Hemoglobin 15.3 13.0 - 17.0 g/dL   HCT 82.9 56.2 - 13.0 %   MCV 87.7 80.0 - 100.0 fL   MCH 29.3 26.0 - 34.0 pg   MCHC 33.4 30.0 - 36.0 g/dL   RDW 86.5 78.4 - 69.6 %   Platelets 247 150 - 400 K/uL   nRBC 0.0 0.0 - 0.2 %   Neutrophils Relative % 71 %   Neutro Abs 7.1 1.7 - 7.7 K/uL   Lymphocytes Relative 19 %   Lymphs Abs 1.9 0.7 - 4.0 K/uL   Monocytes Relative 7 %   Monocytes Absolute 0.7 0.1 - 1.0 K/uL   Eosinophils Relative 3 %   Eosinophils Absolute 0.3 0.0 - 0.5 K/uL   Basophils Relative 0 %   Basophils Absolute 0.0 0.0 - 0.1 K/uL   Immature Granulocytes 0 %   Abs Immature Granulocytes 0.03 0.00 - 0.07 K/uL  Basic metabolic panel  Result Value Ref Range   Sodium 140 135 - 145 mmol/L   Potassium 3.7 3.5 - 5.1 mmol/L   Chloride 104 98 - 111 mmol/L   CO2 28 22 - 32 mmol/L   Glucose, Bld  97 70 - 99 mg/dL   BUN 24 (H) 6 - 20 mg/dL   Creatinine, Ser 9.67 0.61 - 1.24 mg/dL   Calcium 8.9 8.9 - 89.3 mg/dL   GFR, Estimated >81 >01 mL/min   Anion gap 8 5 - 15  Hepatic function panel  Result Value Ref Range   Total Protein 6.2 (L) 6.5 - 8.1 g/dL   Albumin 3.7 3.5 - 5.0 g/dL   AST 15 15 - 41 U/L   ALT 18 0 - 44 U/L   Alkaline Phosphatase 54 38 - 126 U/L   Total Bilirubin 0.8 0.3 - 1.2 mg/dL   Bilirubin, Direct 0.1 0.0 - 0.2 mg/dL   Indirect Bilirubin 0.7 0.3 - 0.9 mg/dL  Lipase, blood  Result Value Ref Range   Lipase 38 11 - 51 U/L  CBC  Result Value Ref Range   WBC 9.9 4.0 - 10.5 K/uL   RBC 5.02 4.22 - 5.81 MIL/uL   Hemoglobin 15.0 13.0 - 17.0 g/dL   HCT 75.1 02.5 - 85.2 %   MCV 88.2 80.0 - 100.0 fL   MCH 29.9 26.0 - 34.0 pg   MCHC 33.9 30.0 - 36.0 g/dL   RDW 77.8 24.2 - 35.3 %   Platelets 216 150 - 400 K/uL   nRBC 0.0 0.0 - 0.2 %  Type and screen [facility]  Result Value Ref Range   ABO/RH(D) O POS    Antibody Screen NEG    Sample Expiration       06/07/2023,2359 Performed at [facility], [address] [place], [place] [address]       Assessment & Plan:   Problem List Items Addressed This Visit       Other   Elevated low density lipoprotein (LDL) cholesterol level    Labs ordered at visit today.  Will make recommendations based on lab results.        Relevant Orders   Lipid panel   Anxiety    New referral placed for patient to see psychiatry. Patient does take Clonazepam PRN for anxiety.       Relevant Orders   Ambulatory referral to Psychiatry   Other Visit Diagnoses     Annual physical exam    -  Primary   Health maintenance reviewed during visit today.  Labs ordered. Vaccines up to date.  Colonoscopy up to date.   Relevant Orders   TSH   Lipid panel   CBC with Differential/Platelet   Comprehensive metabolic panel   Urinalysis, Routine w reflex microscopic   PSA   Need for influenza vaccination       Relevant Orders   Flu vaccine trivalent PF, 6mos and older(Flulaval,Afluria,Fluarix,Fluzone)        Discussed aspirin prophylaxis for myocardial infarction prevention and decision was it was not indicated  LABORATORY TESTING:  Health maintenance labs ordered today as discussed above.     IMMUNIZATIONS:   - Tdap: Tetanus vaccination status reviewed: last tetanus booster within 10 years. - Influenza: Administered today - Pneumovax: Not applicable - Prevnar: Not applicable - COVID: Not applicable - HPV: Not applicable - Shingrix vaccine: Not applicable  SCREENING: - Colonoscopy: Up to date  Discussed with patient purpose of the colonoscopy is to detect colon cancer at curable precancerous or early stages   - AAA Screening: Not applicable  -Hearing Test: Not applicable  -Spirometry: Not applicable   PATIENT COUNSELING:    Sexuality: Discussed sexually transmitted diseases, partner selection, use of  condoms, avoidance of unintended pregnancy  and contraceptive alternatives.   Advised  to avoid cigarette smoking.  I discussed with the patient that most people either abstain from alcohol or drink within safe limits (<=14/week and <=4 drinks/occasion for males, <=7/weeks and <= 3 drinks/occasion for females) and that the risk for alcohol disorders and other health effects rises proportionally with the number of drinks per week and how often a drinker exceeds daily limits.  Discussed cessation/primary prevention of drug use and availability of treatment for abuse.   Diet: Encouraged to adjust caloric intake to maintain  or achieve ideal body weight, to reduce intake of dietary saturated fat and total fat, to limit sodium intake by avoiding high sodium foods and not adding table salt, and to maintain adequate dietary potassium and calcium preferably from fresh fruits, vegetables, and low-fat dairy products.    stressed the importance of regular exercise  Injury prevention: Discussed safety belts, safety helmets, smoke detector, smoking near bedding or upholstery.   Dental health: Discussed importance of regular tooth brushing, flossing, and dental visits.   Follow up plan: NEXT PREVENTATIVE PHYSICAL DUE IN 1 YEAR. Return in about 1 year (around 10/23/2024) for Physical and Fasting labs.

## 2023-10-24 NOTE — Assessment & Plan Note (Signed)
Labs ordered at visit today.  Will make recommendations based on lab results.   

## 2023-10-25 LAB — CBC WITH DIFFERENTIAL/PLATELET
Basophils Absolute: 0.1 10*3/uL (ref 0.0–0.2)
Basos: 1 %
EOS (ABSOLUTE): 0.5 10*3/uL — ABNORMAL HIGH (ref 0.0–0.4)
Eos: 6 %
Hematocrit: 48.6 % (ref 37.5–51.0)
Hemoglobin: 16.2 g/dL (ref 13.0–17.7)
Immature Grans (Abs): 0 10*3/uL (ref 0.0–0.1)
Immature Granulocytes: 0 %
Lymphocytes Absolute: 2.1 10*3/uL (ref 0.7–3.1)
Lymphs: 29 %
MCH: 30.9 pg (ref 26.6–33.0)
MCHC: 33.3 g/dL (ref 31.5–35.7)
MCV: 93 fL (ref 79–97)
Monocytes Absolute: 0.5 10*3/uL (ref 0.1–0.9)
Monocytes: 7 %
Neutrophils Absolute: 4.3 10*3/uL (ref 1.4–7.0)
Neutrophils: 57 %
Platelets: 295 10*3/uL (ref 150–450)
RBC: 5.24 x10E6/uL (ref 4.14–5.80)
RDW: 12.6 % (ref 11.6–15.4)
WBC: 7.5 10*3/uL (ref 3.4–10.8)

## 2023-10-25 LAB — COMPREHENSIVE METABOLIC PANEL
ALT: 25 [IU]/L (ref 0–44)
AST: 18 [IU]/L (ref 0–40)
Albumin: 4.3 g/dL (ref 4.1–5.1)
Alkaline Phosphatase: 61 [IU]/L (ref 44–121)
BUN/Creatinine Ratio: 17 (ref 9–20)
BUN: 17 mg/dL (ref 6–24)
Bilirubin Total: 0.8 mg/dL (ref 0.0–1.2)
CO2: 24 mmol/L (ref 20–29)
Calcium: 9.4 mg/dL (ref 8.7–10.2)
Chloride: 102 mmol/L (ref 96–106)
Creatinine, Ser: 1 mg/dL (ref 0.76–1.27)
Globulin, Total: 1.9 g/dL (ref 1.5–4.5)
Glucose: 98 mg/dL (ref 70–99)
Potassium: 4.2 mmol/L (ref 3.5–5.2)
Sodium: 140 mmol/L (ref 134–144)
Total Protein: 6.2 g/dL (ref 6.0–8.5)
eGFR: 93 mL/min/{1.73_m2} (ref >59)

## 2023-10-25 LAB — LIPID PANEL
Chol/HDL Ratio: 4.1 {ratio} (ref 0.0–5.0)
Cholesterol, Total: 248 mg/dL — ABNORMAL HIGH (ref 100–199)
HDL: 61 mg/dL (ref >39)
LDL Chol Calc (NIH): 153 mg/dL — ABNORMAL HIGH (ref 0–99)
Triglycerides: 187 mg/dL — ABNORMAL HIGH (ref 0–149)
VLDL Cholesterol Cal: 34 mg/dL (ref 5–40)

## 2023-10-25 LAB — TSH: TSH: 0.547 u[IU]/mL (ref 0.450–4.500)

## 2023-10-25 LAB — PSA: Prostate Specific Ag, Serum: 0.4 ng/mL (ref 0.0–4.0)

## 2023-10-31 ENCOUNTER — Telehealth: Payer: 59 | Admitting: Physician Assistant

## 2023-10-31 ENCOUNTER — Telehealth: Payer: Self-pay

## 2023-10-31 ENCOUNTER — Ambulatory Visit: Payer: Self-pay

## 2023-10-31 DIAGNOSIS — M722 Plantar fascial fibromatosis: Secondary | ICD-10-CM | POA: Diagnosis not present

## 2023-10-31 DIAGNOSIS — R300 Dysuria: Secondary | ICD-10-CM

## 2023-10-31 MED ORDER — BETAMETHASONE SOD PHOS & ACET 6 (3-3) MG/ML IJ SUSP
3.0000 mg | Freq: Once | INTRAMUSCULAR | Status: AC
Start: 2023-10-31 — End: 2023-10-31
  Administered 2023-10-31: 3 mg via INTRA_ARTICULAR

## 2023-10-31 NOTE — Patient Instructions (Signed)
[name], thank you for joining [name], PA-C for today's virtual visit.  While this provider is not your primary care provider (PCP), if your PCP is located in our provider database this encounter information will be shared with them immediately following your visit.   A [place] MyChart account gives you access to today's visit and all your visits, tests, and labs performed at [facility] " click here if you don't have a [place] [facility] MyChart account or go to [email or web address]  Consent: (Patient) Lance Walsh provided verbal consent for this virtual visit at the beginning of the encounter.  Current Medications:  Current Outpatient Medications:    EPINEPHrine 0.3 mg/0.3 mL IJ SOAJ injection, Inject 0.3 mg into the muscle as needed for anaphylaxis., Disp: 1 each, Rfl: 1   Erenumab-aooe 140 MG/ML SOAJ, Inject into the skin every 30 (thirty) days. Takes on the 12th, Disp: , Rfl:    fluorometholone (FML) 0.1 % ophthalmic suspension, , Disp: , Rfl:    HYDROcodone-acetaminophen (NORCO/VICODIN) 5-325 MG tablet, Take 1 tablet by mouth every 6 (six) hours as needed for moderate pain or severe pain., Disp: 8 tablet, Rfl: 0   meloxicam (MOBIC) 15 MG tablet, Take 1 tablet (15 mg total) by mouth daily., Disp: 60 tablet, Rfl: 2   ondansetron (ZOFRAN-ODT) 4 MG disintegrating tablet, Take 1 tablet (4 mg total) by mouth every 8 (eight) hours as needed for nausea or vomiting., Disp: 20 tablet, Rfl: 0   Perfluorohexyloctane (MIEBO OP), Apply to eye 2 (two) times daily., Disp: , Rfl:    RESTASIS 0.05 % ophthalmic emulsion, Place 1 drop into both eyes 4 (four) times daily., Disp: , Rfl:    SUMAtriptan (IMITREX) 100 MG tablet, Take 1 tablet (100 mg total) by mouth daily as needed for migraine or headache. Maximum once a day only. Not to exceed 1 tab in 24 hrs., Disp: 30 tablet, Rfl: 0   Medications ordered in this encounter:  No orders of the defined types were placed in this  encounter.    *If you need refills on other medications prior to your next appointment, please contact your pharmacy*  Follow-Up: Call back or seek an in-person evaluation if the symptoms worsen or if the condition fails to improve as anticipated.  [facility] [phone]  Other Instructions   Based on what you shared with me, I feel your condition warrants further evaluation and I recommend that you be seen for a face to face office visit.  Male bladder infections are not very common.  We worry about prostate or kidney conditions.  The standard of care is to examine the abdomen and kidneys, and to do a urine and blood test to make sure that something more serious is not going on.  We recommend that you see a provider today.  If your doctor's office is closed [place] has the following Urgent Cares:   For an urgent face to face visit, [place] has six urgent care centers for your convenience:     [facility] Health Urgent Care Center at [facility] Directions [phone] [address] [place], [place] [address]    [facility] Health Urgent Care Center [facility]) Get Driving Directions [phone] [address] [place], [place] [address]  [facility] Health Urgent Care Center [facility]) Get Driving Directions [phone] [address] [place],  [place]  [address]  [facility] Health Urgent Care at [facility] Get Driving Directions  [phone] [address], [address] [place], [place] [address]   [facility] Health Urgent Care at [facility] Get Driving Directions  [phone] [address] [place], [place] [address]   [facility] Health Urgent Care at [facility] Directions [phone] [address], [address] [place], [place] [address]    If you have been instructed to have an in-person evaluation today at a local Urgent Care facility, please use the link below. It will take you to a  list of all of our available [place] Urgent Cares, including address, phone number and hours of operation. Please do not delay care.  [facility]  If you or a family member do not have a primary care provider, use the link below to schedule a visit and establish care. When you choose a [place] primary care physician or advanced practice provider, you gain a long-term partner in health. Find a Primary Care Provider  Learn more about [place]'s in-office and virtual care options: [facility] - Get Care Now

## 2023-10-31 NOTE — Telephone Encounter (Signed)
  Chief Complaint: urination pain Symptoms: painful urination every time voids Frequency: last week Pertinent Negatives: Patient denies fever, abd pain, flank pain, penis discharge or scrotal pain Disposition: [] ED /[] Urgent Care (no appt availability in office) / [] Appointment(In office/virtual)/ [x]  [facility]/ [] Home Care/ [] Refused Recommended Disposition /[] [facility]/ []  Follow-up with PCP Additional Notes: no appts until Thursday  Reason for Disposition  All other males with painful urination  Answer Assessment - Initial Assessment Questions 1. SEVERITY: "How bad is the pain?"  (e.g., Scale 1-10; mild, moderate, or severe)   - MILD (1-3): Complains slightly about urination hurting.   - MODERATE (4-7): Interferes with normal activities.     - SEVERE (8-10): Excruciating, unwilling or unable to urinate because of the pain.      mild 2. FREQUENCY: "How many times have you had painful urination today?"      Every time  3. PATTERN: "Is pain present every time you urinate or just sometimes?"      yes 4. ONSET: "When did the painful urination start?"      Last week 5. FEVER: "Do you have a fever?" If Yes, ask: "What is your temperature, how was it measured, and when did it start?"     no 8. OTHER SYMPTOMS: "Do you have any other symptoms?" (e.g., flank pain, penis discharge, scrotal pain, blood in urine)     no  Protocols used: Urination Pain - Male-A-AH

## 2023-10-31 NOTE — Telephone Encounter (Signed)
Pt given lab results per notes of K. Holdsworth NP on 10/31/23. Pt verbalized understanding.

## 2023-10-31 NOTE — Progress Notes (Signed)
Virtual Visit Consent   Lance Walsh, you are scheduled for a virtual visit with a [facility] provider today. Just as with appointments in the office, your consent must be obtained to participate. Your consent will be active for this visit and any virtual visit you may have with one of our providers in the next 365 days. If you have a MyChart account, a copy of this consent can be sent to you electronically.  As this is a virtual visit, video technology does not allow for your provider to perform a traditional examination. This may limit your provider's ability to fully assess your condition. If your provider identifies any concerns that need to be evaluated in person or the need to arrange testing (such as labs, EKG, etc.), we will make arrangements to do so. Although advances in technology are sophisticated, we cannot ensure that it will always work on either your end or our end. If the connection with a video visit is poor, the visit may have to be switched to a telephone visit. With either a video or telephone visit, we are not always able to ensure that we have a secure connection.  By engaging in this virtual visit, you consent to the provision of healthcare and authorize for your insurance to be billed (if applicable) for the services provided during this visit. Depending on your insurance coverage, you may receive a charge related to this service.  I need to obtain your verbal consent now. Are you willing to proceed with your visit today? LEOCADIO Walsh has provided verbal consent on 10/31/2023 for a virtual visit (video or telephone). Lance Walsh, [place]  Date: 10/31/2023 4:27 PM  Virtual Visit via Video Note   I, Lance Walsh, connected with  KASEM HAFELE  ([id number], May 26, 1976) on 10/31/23 at  4:15 PM EST by a video-enabled telemedicine application and verified that I am speaking with the correct person using two identifiers.  Location: Patient: Virtual Visit Location  Patient: Home Provider: Virtual Visit Location Provider: Home Office   I discussed the limitations of evaluation and management by telemedicine and the availability of in person appointments. The patient expressed understanding and agreed to proceed.    History of Present Illness: Lance Walsh is a 47 y.o. who identifies as a male who was assigned male at birth, and is being seen today for urinary urgency, frequency and dysuria with penile pain. Denies fever, chills, nausea or vomiting. Some low back pain but notes this is not out of the ordinary for him. Denies hematuria. Denies penile swelling or discharge. No concern for STI. Denies recent trauma or injury. Was just in with his PCP with routine labs but not symptomatic at that time.   HPI: HPI  Problems:  Patient Active Problem List   Diagnosis Date Noted   Acute maxillary sinusitis 08/09/2023   Sleep apnea 07/22/2022   Anxiety 05/25/2022   Eczema 05/25/2022   Chronic back pain 12/09/2020   Gastroesophageal reflux disease 12/09/2020   Long-term current use of benzodiazepine 12/09/2020   Elevated low density lipoprotein (LDL) cholesterol level 12/06/2020   Allergic rhinitis 02/13/2019   Insomnia 11/13/2018   Migraine headache 12/29/2016   Depression 11/04/2015    Allergies:  Allergies  Allergen Reactions   Shellfish-Derived Products Swelling    Throat Closing   Lactose Intolerance (Gi) Diarrhea         Milk (Cow)    Medications:  Current Outpatient Medications:    EPINEPHrine 0.3 mg/0.3  mL IJ SOAJ injection, Inject 0.3 mg into the muscle as needed for anaphylaxis., Disp: 1 each, Rfl: 1   Erenumab-aooe 140 MG/ML SOAJ, Inject into the skin every 30 (thirty) days. Takes on the 12th, Disp: , Rfl:    fluorometholone (FML) 0.1 % ophthalmic suspension, , Disp: , Rfl:    HYDROcodone-acetaminophen (NORCO/VICODIN) 5-325 MG tablet, Take 1 tablet by mouth every 6 (six) hours as needed for moderate pain or severe pain., Disp: 8 tablet,  Rfl: 0   meloxicam (MOBIC) 15 MG tablet, Take 1 tablet (15 mg total) by mouth daily., Disp: 60 tablet, Rfl: 2   ondansetron (ZOFRAN-ODT) 4 MG disintegrating tablet, Take 1 tablet (4 mg total) by mouth every 8 (eight) hours as needed for nausea or vomiting., Disp: 20 tablet, Rfl: 0   Perfluorohexyloctane (MIEBO OP), Apply to eye 2 (two) times daily., Disp: , Rfl:    RESTASIS 0.05 % ophthalmic emulsion, Place 1 drop into both eyes 4 (four) times daily., Disp: , Rfl:    SUMAtriptan (IMITREX) 100 MG tablet, Take 1 tablet (100 mg total) by mouth daily as needed for migraine or headache. Maximum once a day only. Not to exceed 1 tab in 24 hrs., Disp: 30 tablet, Rfl: 0  Observations/Objective: Patient is well-developed, well-nourished in no acute distress.  Resting comfortably at home.  Head is normocephalic, atraumatic.  No labored breathing. Speech is clear and coherent with logical content.  Patient is alert and oriented at baseline.   Assessment and Plan: 1. Dysuria  In male. No alarm signs or symptoms present. Low risk for STI.  UA from CPE on 11/25 shows cloudiness of urine but no other findings. PSA normal at 0.4 and unchanged from prior checks. Discussed he needs exam and urine culture due to UTI in men being at higher risk for complication. PCP office cannot see him until Thursday. Will have him evaluated at in person Urgent Care. Resources sent.   Follow Up Instructions: I discussed the assessment and treatment plan with the patient. The patient was provided an opportunity to ask questions and all were answered. The patient agreed with the plan and demonstrated an understanding of the instructions.  A copy of instructions were sent to the patient via MyChart unless otherwise noted below.   The patient was advised to call back or seek an in-person evaluation if the symptoms worsen or if the condition fails to improve as anticipated.    Lance Climes, PA-C

## 2023-11-01 ENCOUNTER — Ambulatory Visit
Admission: RE | Admit: 2023-11-01 | Discharge: 2023-11-01 | Disposition: A | Discharge status: Home / Self Care | Payer: 59 | Source: Ambulatory Visit | Attending: Emergency Medicine | Admitting: Emergency Medicine

## 2023-11-01 VITALS — BP 104/75 | HR 75 | Temp 98.8°F | Resp 16 | Ht 69.5 in | Wt 182.2 lb

## 2023-11-01 DIAGNOSIS — R350 Frequency of micturition: Secondary | ICD-10-CM | POA: Diagnosis not present

## 2023-11-01 LAB — URINALYSIS, W/ REFLEX TO CULTURE (INFECTION SUSPECTED)
Bilirubin Urine: NEGATIVE
Glucose, UA: NEGATIVE mg/dL
Hgb urine dipstick: NEGATIVE
Ketones, ur: NEGATIVE mg/dL
Leukocytes,Ua: NEGATIVE
Nitrite: NEGATIVE
Protein, ur: NEGATIVE mg/dL
Specific Gravity, Urine: 1.02 (ref 1.005–1.030)
Squamous Epithelial / HPF: NONE SEEN /[HPF] (ref 0–5)
pH: 7 (ref 5.0–8.0)

## 2023-11-01 MED ORDER — PHENAZOPYRIDINE HCL 200 MG PO TABS: 200.0000 mg | ORAL_TABLET | Freq: Three times a day (TID) | ORAL | 0 refills | Status: DC

## 2023-11-01 NOTE — ED Triage Notes (Signed)
Pt c/o urinary freq x2 wks. States had a virtual call with doc.Marland Kitchen Recommend I get tested for urinary tract infection. - Entered by patient

## 2023-11-01 NOTE — ED Provider Notes (Signed)
[facility]    CSN: [id number] Arrival date & time: 11/01/23  1331      History   Chief Complaint Chief Complaint  Patient presents with   Urinary Frequency    Appt    HPI Lance Walsh is a 47 y.o. male.   47 year old male patient, Lance Walsh, presents to urgent care for evaluation of urinary frequency x 2 weeks.  Patient had virtual visit and was advised to have urine tested for UTI, patient is here for urinalysis.  Patient denies any concerns for STI  The history is provided by the patient. No language interpreter was used.    Past Medical History:  Diagnosis Date   Bipolar 1 disorder (HCC)    Calculus of kidney    Depression    Fatty liver    GERD (gastroesophageal reflux disease)    Headache    migraine and sinus   History of kidney stones    Plantar fasciitis    Sleep apnea    not using CPAP    Patient Active Problem List   Diagnosis Date Noted   Urinary frequency 11/01/2023   Acute maxillary sinusitis 08/09/2023   Sleep apnea 07/22/2022   Anxiety 05/25/2022   Eczema 05/25/2022   Chronic back pain 12/09/2020   Gastroesophageal reflux disease 12/09/2020   Long-term current use of benzodiazepine 12/09/2020   Elevated low density lipoprotein (LDL) cholesterol level 12/06/2020   Allergic rhinitis 02/13/2019   Insomnia 11/13/2018   Migraine headache 12/29/2016   Depression 11/04/2015    Past Surgical History:  Procedure Laterality Date   COLONOSCOPY     COLONOSCOPY WITH PROPOFOL N/A 09/17/2022   Procedure: COLONOSCOPY WITH PROPOFOL;  Surgeon: Midge Minium, MD;  Location: [facility];  Service: Endoscopy;  Laterality: N/A;   NASAL SEPTOPLASTY W/ TURBINOPLASTY Bilateral 06/30/2021   Procedure: NASAL SEPTOPLASTY WITH TURBINATE REDUCTION, SUBMUCOUS RESECTION;  Surgeon: Geanie Logan, MD;  Location: [facility];  Service: ENT;  Laterality: Bilateral;   REFRACTIVE SURGERY Bilateral 01/2022   TURBINATE REDUCTION Left 03/08/2023    Procedure: SUBMUCUSAL MIDDLE TURBINATE REDUCTION;  Surgeon: Geanie Logan, MD;  Location: [facility];  Service: ENT;  Laterality: Left;   WISDOM TOOTH EXTRACTION         Home Medications    Prior to Admission medications   Medication Sig Start Date End Date Taking? Authorizing Provider  EPINEPHrine 0.3 mg/0.3 mL IJ SOAJ injection Inject 0.3 mg into the muscle as needed for anaphylaxis. 04/13/23  Yes Larae Grooms, NP  Erenumab-aooe 140 MG/ML SOAJ Inject into the skin every 30 (thirty) days. Takes on the 12th   Yes [provider]  fluorometholone (FML) 0.1 % ophthalmic suspension  03/06/20  Yes [provider]  HYDROcodone-acetaminophen (NORCO/VICODIN) 5-325 MG tablet Take 1 tablet by mouth every 6 (six) hours as needed for moderate pain or severe pain. 06/04/23 06/03/24 Yes Shaune Pollack, MD  meloxicam (MOBIC) 15 MG tablet Take 1 tablet (15 mg total) by mouth daily. 04/13/23  Yes Larae Grooms, NP  ondansetron (ZOFRAN-ODT) 4 MG disintegrating tablet Take 1 tablet (4 mg total) by mouth every 8 (eight) hours as needed for nausea or vomiting. 06/04/23  Yes Shaune Pollack, MD  Perfluorohexyloctane (MIEBO OP) Apply to eye 2 (two) times daily.   Yes [provider]  phenazopyridine (PYRIDIUM) 200 MG tablet Take 1 tablet (200 mg total) by mouth 3 (three) times daily. 11/01/23  Yes Stephanie Littman, Para March, NP  RESTASIS 0.05 % ophthalmic emulsion Place 1  drop into both eyes 4 (four) times daily. 06/28/22  Yes [provider]  SUMAtriptan (IMITREX) 100 MG tablet Take 1 tablet (100 mg total) by mouth daily as needed for migraine or headache. Maximum once a day only. Not to exceed 1 tab in 24 hrs. 05/25/22  Yes Gabriel Cirri, NP    Family History Family History  Problem Relation Age of Onset   Alcohol abuse Mother    Diabetes Mother    Cancer Mother    Diabetes Sister    Cancer Sister     Social History Social History   Tobacco Use   Smoking status:  Never   Smokeless tobacco: Never  Vaping Use   Vaping status: Never Used  Substance Use Topics   Alcohol use: Not Currently    Alcohol/week: 2.0 standard drinks of alcohol    Types: 2 Shots of liquor per week    Comment: only on the weekends   Drug use: No     Allergies   Shellfish-derived products, Lactose intolerance (gi), and Milk (cow)   Review of Systems Review of Systems  Constitutional:  Negative for fever.  Gastrointestinal:  Negative for abdominal pain, nausea and vomiting.  Genitourinary:  Positive for dysuria and frequency. Negative for difficulty urinating, hematuria, scrotal swelling, testicular pain and urgency.  Musculoskeletal:  Negative for back pain.  All other systems reviewed and are negative.    Physical Exam Triage Vital Signs ED Triage Vitals  Encounter Vitals Group     BP 11/01/23 1339 104/75     Systolic BP Percentile --      Diastolic BP Percentile --      Pulse Rate 11/01/23 1339 75     Resp 11/01/23 1339 16     Temp 11/01/23 1339 98.8 F (37.1 C)     Temp Source 11/01/23 1339 Oral     SpO2 11/01/23 1339 99 %     Weight 11/01/23 1338 182 lb 3.2 oz (82.6 kg)     Height 11/01/23 1338 5' 9.5" (1.765 m)     Head Circumference --      Peak Flow --      Pain Score 11/01/23 1342 0     Pain Loc --      Pain Education --      Exclude from Growth Chart --    No data found.  Updated Vital Signs BP 104/75 (BP Location: Left Arm)   Pulse 75   Temp 98.8 F (37.1 C) (Oral)   Resp 16   Ht 5' 9.5" (1.765 m)   Wt 182 lb 3.2 oz (82.6 kg)   SpO2 99%   BMI 26.52 kg/m   Visual Acuity Right Eye Distance:   Left Eye Distance:   Bilateral Distance:    Right Eye Near:   Left Eye Near:    Bilateral Near:     Physical Exam Vitals and nursing note reviewed.  Constitutional:      General: He is not in acute distress.    Appearance: He is well-developed and well-groomed.  HENT:     Head: Normocephalic and atraumatic.  Eyes:      Conjunctiva/sclera: Conjunctivae normal.  Cardiovascular:     Rate and Rhythm: Normal rate and regular rhythm.     Pulses: Normal pulses.     Heart sounds: Normal heart sounds. No murmur heard. Pulmonary:     Effort: Pulmonary effort is normal. No respiratory distress.     Breath sounds: Normal breath sounds and air  entry.  Abdominal:     Palpations: Abdomen is soft.     Tenderness: There is no abdominal tenderness.  Musculoskeletal:        General: No swelling.     Cervical back: Neck supple.  Skin:    General: Skin is warm and dry.     Capillary Refill: Capillary refill takes less than 2 seconds.  Neurological:     General: No focal deficit present.     Mental Status: He is alert and oriented to person, place, and time.     GCS: GCS eye subscore is 4. GCS verbal subscore is 5. GCS motor subscore is 6.     Cranial Nerves: No cranial nerve deficit.     Sensory: No sensory deficit.  Psychiatric:        Attention and Perception: Attention normal.        Mood and Affect: Mood normal.        Speech: Speech normal.        Behavior: Behavior is cooperative.        Thought Content: Thought content normal.      UC Treatments / Results  Labs (all labs ordered are listed, but only abnormal results are displayed) Labs Reviewed  URINALYSIS, W/ REFLEX TO CULTURE (INFECTION SUSPECTED) - Abnormal; Notable for the following components:      Result Value   APPearance HAZY (*)    Bacteria, UA RARE (*)    All other components within normal limits  URINE CULTURE    EKG   Radiology No results found.  Procedures Procedures (including critical care time)  Medications Ordered in UC Medications - No data to display  Initial Impression / Assessment and Plan / UC Course  I have reviewed the triage vital signs and the nursing notes.  Pertinent labs & imaging results that were available during my care of the patient were reviewed by me and considered in my medical decision making (see  chart for details).  Clinical Course as of 11/01/23 1638  Tue Nov 01, 2023  1440 Will culture urine and treat if UC is positive.  [JD]    Clinical Course User Index [JD] Colby Catanese, Para March, NP  Discussed exam findings and plan of care with patient, strict go to ER precautions given.   Patient verbalized understanding to this provider.  Ddx: Urinary frequency, kidney stone, prostatitis, UTI, STI Final Clinical Impressions(s) / UC Diagnoses   Final diagnoses:  Urinary frequency     Discharge Instructions      Your urine was negative for UTI. Drink more water, push fluids, take pyridium as directed,will turn urine orange. Follow up with PCP.   If you develop difficulty urinating, abdominal pain, fever, or worsening symptoms go to ER for further evaluation.      ED Prescriptions     Medication Sig Dispense Auth. Provider   phenazopyridine (PYRIDIUM) 200 MG tablet Take 1 tablet (200 mg total) by mouth 3 (three) times daily. 6 tablet Kamala Kolton, Para March, NP      PDMP not reviewed this encounter.   Clancy Gourd, NP 11/01/23 [phone]

## 2023-11-01 NOTE — Discharge Instructions (Addendum)
Your urine was negative for UTI. Drink more water, push fluids, take pyridium as directed,will turn urine orange. Follow up with PCP.   If you develop difficulty urinating, abdominal pain, fever, or worsening symptoms go to ER for further evaluation.

## 2023-11-02 LAB — URINE CULTURE
Culture: NO GROWTH
Special Requests: NORMAL

## 2023-11-09 ENCOUNTER — Encounter: Payer: Self-pay | Admitting: Nurse Practitioner

## 2023-11-09 ENCOUNTER — Telehealth (INDEPENDENT_AMBULATORY_CARE_PROVIDER_SITE_OTHER): Payer: 59 | Admitting: Nurse Practitioner

## 2023-11-09 DIAGNOSIS — J0110 Acute frontal sinusitis, unspecified: Secondary | ICD-10-CM | POA: Diagnosis not present

## 2023-11-09 DIAGNOSIS — B9789 Other viral agents as the cause of diseases classified elsewhere: Secondary | ICD-10-CM | POA: Diagnosis not present

## 2023-11-09 DIAGNOSIS — J019 Acute sinusitis, unspecified: Secondary | ICD-10-CM | POA: Insufficient documentation

## 2023-11-09 MED ORDER — AMOXICILLIN-POT CLAVULANATE 875-125 MG PO TABS: 1.0000 | ORAL_TABLET | Freq: Two times a day (BID) | ORAL | 0 refills | Status: AC

## 2023-11-09 MED ORDER — BENZONATATE 100 MG PO CAPS: 100.0000 mg | ORAL_CAPSULE | Freq: Three times a day (TID) | ORAL | 0 refills | Status: DC | PRN

## 2023-11-09 MED ORDER — LIDOCAINE VISCOUS HCL 2 % MT SOLN: 15.0000 mL | OROMUCOSAL | 0 refills | Status: AC | PRN

## 2023-11-09 MED ORDER — PREDNISONE 20 MG PO TABS: 40.0000 mg | ORAL_TABLET | Freq: Every day | ORAL | 0 refills | Status: AC

## 2023-11-09 NOTE — Progress Notes (Signed)
There were no vitals taken for this visit.   Subjective:    Patient ID: Lance Walsh, male    DOB: 02-Nov-1976, 47 y.o.   MRN: [id number]  HPI: Lance Walsh is a 47 y.o. male  Chief Complaint  Patient presents with   URI    Patient states he has been having a cough, sinus congestion, sinus pressure, headache, and sore throat. States symptoms started about a week ago. States he has been doing sinus rinses to try to help but they have not helped much.    Virtual Visit via Video Note  I connected with [name] on 11/09/23 at  2:00 PM EST by a video enabled telemedicine application and verified that I am speaking with the correct person using two identifiers.  Location: Patient: home Provider: work   I discussed the limitations of evaluation and management by telemedicine and the availability of in person appointments. The patient expressed understanding and agreed to proceed.  I discussed the assessment and treatment plan with the patient. The patient was provided an opportunity to ask questions and all were answered. The patient agreed with the plan and demonstrated an understanding of the instructions.   The patient was advised to call back or seek an in-person evaluation if the symptoms worsen or if the condition fails to improve as anticipated.  I provided 21 minutes of non-face-to-face time during this encounter.   Marjie Skiff, NP   UPPER RESPIRATORY TRACT INFECTION Started a little over a week ago and has progressed.  Started out with sniffles, during physical.  Has been doing sinus rinses, passing some drainage.  No Covid testing.  Was treated with Amoxicillin last in September with benefit.  History of sinus surgery with Dr. Willeen Cass. Fever: no Cough: yes Shortness of breath: no Wheezing: no Chest pain: no Chest tightness: no Chest congestion: no Nasal congestion: yes Runny nose: yes Post nasal drip: yes Sneezing: no Sore throat: yes Swollen glands:  no Sinus pressure: yes Headache: yes Face pain: no Toothache: yes Ear pain: none Ear pressure: none Eyes red/itching: a little bit Eye drainage/crusting: no  Vomiting: no Rash: no Fatigue: yes Sick contacts: no Strep contacts: no  Context: worse Recurrent sinusitis: no Relief with OTC cold/cough medications: a little bit  Treatments attempted: Mucinex, nasal spray, and allergy medication     Relevant past medical, surgical, family and social history reviewed and updated as indicated. Interim medical history since our last visit reviewed. Allergies and medications reviewed and updated.  Review of Systems  Constitutional:  Positive for chills and fatigue. Negative for activity change, appetite change, diaphoresis and fever.  HENT:  Positive for postnasal drip, rhinorrhea, sinus pressure, sinus pain and sore throat. Negative for congestion, ear discharge, ear pain, sneezing and voice change.   Respiratory:  Positive for cough. Negative for chest tightness, shortness of breath and wheezing.   Cardiovascular:  Negative for chest pain, palpitations and leg swelling.  Gastrointestinal: Negative.   Neurological:  Positive for headaches.  Psychiatric/Behavioral: Negative.      Per HPI unless specifically indicated above     Objective:    There were no vitals taken for this visit.  Wt Readings from Last 3 Encounters:  11/01/23 182 lb 3.2 oz (82.6 kg)  10/24/23 182 lb 3.2 oz (82.6 kg)  10/14/23 184 lb 12.8 oz (83.8 kg)    Physical Exam Vitals and nursing note reviewed.  Constitutional:      General: He is awake.  He is not in acute distress.    Appearance: He is well-developed. He is not ill-appearing.  HENT:     Head: Normocephalic.     Right Ear: Hearing normal. No drainage.     Left Ear: Hearing normal. No drainage.     Nose:     Right Sinus: Frontal sinus tenderness present.     Left Sinus: Frontal sinus tenderness present.     Comments: Frontal sinus pain on self  assessment. Congested sounding. Eyes:     General: Lids are normal.        Right eye: No discharge.        Left eye: No discharge.     Conjunctiva/sclera: Conjunctivae normal.  Pulmonary:     Effort: Pulmonary effort is normal. No accessory muscle usage or respiratory distress.  Musculoskeletal:     Cervical back: Normal range of motion.  Neurological:     Mental Status: He is alert and oriented to person, place, and time.  Psychiatric:        Mood and Affect: Mood normal.        Behavior: Behavior normal. Behavior is cooperative.        Thought Content: Thought content normal.        Judgment: Judgment normal.     Results for orders placed or performed during the hospital encounter of 11/01/23  Urine Culture   Specimen: Urine, Clean Catch  Result Value Ref Range   Specimen Description      URINE, CLEAN CATCH Performed at [facility], [address], [place], [place] [address]    Special Requests      Normal Performed at [facility], [address], [place], [place] [address]    Culture      NO GROWTH Performed at [facility], [address], [place], [place] [address]    Report Status 11/02/2023 FINAL   Urinalysis, w/ Reflex to Culture (Infection Suspected) -Urine, Clean Catch  Result Value Ref Range   Specimen Source URINE, CLEAN CATCH    Color, Urine YELLOW YELLOW   APPearance HAZY (A) CLEAR   Specific Gravity, Urine 1.020 1.005 - 1.030   pH 7.0 5.0 - 8.0   Glucose, UA NEGATIVE NEGATIVE mg/dL   Hgb urine dipstick NEGATIVE NEGATIVE   Bilirubin Urine NEGATIVE NEGATIVE   Ketones, ur NEGATIVE NEGATIVE mg/dL   Protein, ur NEGATIVE NEGATIVE mg/dL   Nitrite NEGATIVE NEGATIVE   Leukocytes,Ua NEGATIVE NEGATIVE   Squamous Epithelial / HPF NONE SEEN 0 - 5 /HPF   WBC, UA 0-5 0 - 5 WBC/hpf   RBC / HPF 0-5 0 - 5 RBC/hpf   Bacteria, UA RARE (A) NONE SEEN   Amorphous Crystal PRESENT       Assessment & Plan:   Problem List Items  Addressed This Visit       Respiratory   Acute non-recurrent frontal sinusitis - Primary    Acute for over one week of symptoms, history of similar in September + history of sinus surgery in past.  Will start Augmentin BID for 7 days + Prednisone 40 MG daily for 5 days.  Viscous Lidocaine and Tessalon as needed.  Recommend: - Increased rest - Increasing Fluids - Acetaminophen as needed for fever/pain.  - Salt water gargling, chloraseptic spray and throat lozenges - Mucinex.  - Saline sinus flushes or a neti pot.  - Humidifying the air.       Relevant Medications   amoxicillin-clavulanate (AUGMENTIN)  875-125 MG tablet   predniSONE (DELTASONE) 20 MG tablet   benzonatate (TESSALON PERLES) 100 MG capsule     Follow up plan: Return if symptoms worsen or fail to improve.

## 2023-11-09 NOTE — Assessment & Plan Note (Signed)
Acute for over one week of symptoms, history of similar in September + history of sinus surgery in past.  Will start Augmentin BID for 7 days + Prednisone 40 MG daily for 5 days.  Viscous Lidocaine and Tessalon as needed.  Recommend: - Increased rest - Increasing Fluids - Acetaminophen as needed for fever/pain.  - Salt water gargling, chloraseptic spray and throat lozenges - Mucinex.  - Saline sinus flushes or a neti pot.  - Humidifying the air.

## 2023-11-09 NOTE — Patient Instructions (Signed)

## 2023-11-11 ENCOUNTER — Ambulatory Visit (INDEPENDENT_AMBULATORY_CARE_PROVIDER_SITE_OTHER): Payer: 59 | Admitting: Podiatry

## 2023-11-11 ENCOUNTER — Encounter: Payer: Self-pay | Admitting: Podiatry

## 2023-11-11 DIAGNOSIS — M722 Plantar fascial fibromatosis: Secondary | ICD-10-CM

## 2023-11-11 MED ORDER — BETAMETHASONE SOD PHOS & ACET 6 (3-3) MG/ML IJ SUSP
3.0000 mg | Freq: Once | INTRAMUSCULAR | Status: AC
  Administered 2023-11-11: 3 mg via INTRA_ARTICULAR

## 2023-11-11 NOTE — Progress Notes (Signed)
   Chief Complaint  Patient presents with   Foot Pain    Patient is here for bilateral foot pain F/U    Subjective:  Patient h/o endoscopic plantar fasciotomy.  DOS: 01/27/2023 presenting for follow-up evaluation of recalcitrant plantar fasciitis bilateral heels.  He says that his right heel is no longer hurting.  He was able to take 2 weeks off of work and he feels significantly better.  Seems to have some residual tenderness to the left heel.  Last visit patient was molded for orthotics and they are pending dispensable.  Past Medical History:  Diagnosis Date   Bipolar 1 disorder (HCC)    Calculus of kidney    Depression    Fatty liver    GERD (gastroesophageal reflux disease)    Headache    migraine and sinus   History of kidney stones    Plantar fasciitis    Sleep apnea    not using CPAP    Past Surgical History:  Procedure Laterality Date   COLONOSCOPY     COLONOSCOPY WITH PROPOFOL N/A 09/17/2022   Procedure: COLONOSCOPY WITH PROPOFOL;  Surgeon: Midge Minium, MD;  Location: [facility];  Service: Endoscopy;  Laterality: N/A;   NASAL SEPTOPLASTY W/ TURBINOPLASTY Bilateral 06/30/2021   Procedure: NASAL SEPTOPLASTY WITH TURBINATE REDUCTION, SUBMUCOUS RESECTION;  Surgeon: Geanie Logan, MD;  Location: [facility];  Service: ENT;  Laterality: Bilateral;   REFRACTIVE SURGERY Bilateral 01/2022   TURBINATE REDUCTION Left 03/08/2023   Procedure: SUBMUCUSAL MIDDLE TURBINATE REDUCTION;  Surgeon: Geanie Logan, MD;  Location: [facility];  Service: ENT;  Laterality: Left;   WISDOM TOOTH EXTRACTION      Allergies  Allergen Reactions   Shellfish-Derived Products Swelling    Throat Closing   Lactose Intolerance (Gi) Diarrhea         Milk (Cow)     Objective/Physical Exam Physical Exam General: The patient is alert and oriented x3 in no acute distress.  Dermatology: Skin is cool, dry and supple bilateral lower extremities. Negative for open lesions or  macerations.  Vascular: Palpable pedal pulses bilaterally. No edema or erythema noted. Capillary refill within normal limits.  Neurological: Grossly intact via light touch  Musculoskeletal Exam: There continues to be some tenderness with palpation along the plantar medial aspect of the left heel.  Right heel is asymptomatic.  Consistent with recalcitrant plantar fasciitis   Assessment: 1. H/o endoscopic plantar fasciotomy bilateral. DOS: 01/27/2023 2.  Residual plantar fasciitis bilateral  -Patient evaluated -Injection of 0.5 cc Celestone Soluspan injected into the plantar fascia left - Continue meloxicam 15 mg daily as needed - Orthotics dispensable pending.  He was molded for custom orthotics last visit -Return to clinic for orthotics pickup.  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 Felecia Shelling, DPM    [address] [place], [place] [address]                Office [phone]  Fax [phone]

## 2023-11-16 ENCOUNTER — Ambulatory Visit: Payer: Self-pay | Admitting: *Deleted

## 2023-11-16 DIAGNOSIS — R051 Acute cough: Secondary | ICD-10-CM

## 2023-11-16 NOTE — Telephone Encounter (Signed)
I have ordered a chest xray for evaluation.  Patient should walk into [place] to obtain imaging.

## 2023-11-16 NOTE — Telephone Encounter (Signed)
  Chief Complaint: Had a video visit with Larae Grooms, NP on 11/09/2023.   Symptoms: Sinusitis, sore throat, coughing.    Antibiotic has helped but he feels like it has moved into his chest.   His chest is sore as a result of coughing so much.   Mucus is clear/yellow now instead of green.   Still having sinus headaches.   Frequency: Better after antibiotic but now sore in chest from coughing so much.  Pertinent Negatives: Patient denies having green mucus any more. Disposition: [] ED /[] Urgent Care (no appt availability in office) / [] Appointment(In office/virtual)/ []  [facility]/ [] Home Care/ [] Refused Recommended Disposition /[] [facility]/ [x]  Follow-up with PCP Additional Notes: Message sent to Larae Grooms, NP.    Pt. Agreeable to someone calling him back with further recommendations for his symptoms.

## 2023-11-16 NOTE — Addendum Note (Signed)
Addended by: Larae Grooms on: 11/16/2023 12:30 PM   Modules accepted: Orders

## 2023-11-16 NOTE — Telephone Encounter (Signed)
Message from Chesaning E sent at 11/16/2023  8:12 AM EST  Summary: Residual symptoms following visit   Pt says he still has symptoms following his recent visit, no improvement. Congestion and chest discomfort.  [phone]          Call History  Contact Date/Time Type Contact Phone/Fax By  11/16/2023 08:12 AM EST Phone (Incoming) Raeqwon, Mccullar (Self) [phone] Rexene Edison) Randol Kern   Reason for Disposition  Lots of coughing    VV done with Larae Grooms, NP on 12/11 for sinusitis.  Answer Assessment - Initial Assessment Questions 1. LOCATION: "Where does it hurt?"     Did a video visit with Larae Grooms, NP on 12/11.   My sinuses are much better after taking the antibiotic.  I'm still coughing.   My chest is getting sore from coughing so much.   I have clear/yellow mucus now.  The middle of my chest is sore from coughing so much.   The green mucus is gone.    I'm breaking out in sweats randomly.   I feel like it's moved into my chest.  I'm still having the headaches.   2. ONSET: "When did the sinus pain start?"  (e.g., hours, days)      Got better after taking the antibiotic but now it's moved into my chest. 3. SEVERITY: "How bad is the pain?"   (Scale 1-10; mild, moderate or severe)   - MILD (1-3): doesn't interfere with normal activities    - MODERATE (4-7): interferes with normal activities (e.g., work or school) or awakens from sleep   - SEVERE (8-10): excruciating pain and patient unable to do any normal activities        Moderate due to the coughing. 4. RECURRENT SYMPTOM: "Have you ever had sinus problems before?" If Yes, ask: "When was the last time?" and "What happened that time?"      Not asked 5. NASAL CONGESTION: "Is the nose blocked?" If Yes, ask: "Can you open it or must you breathe through your mouth?"     Yes but not as bad.   Still having the sinus headaches. 6. NASAL DISCHARGE: "Do you have discharge from your nose?" If so ask, "What color?"      Yes 7. FEVER: "Do you have a fever?" If Yes, ask: "What is it, how was it measured, and when did it start?"      Randomly breaking out in sweats. 8. OTHER SYMPTOMS: "Do you have any other symptoms?" (e.g., sore throat, cough, earache, difficulty breathing)     I'm using the spray for the sore throat and the Tessalon for the coughing and Mucinex but concerned since I'm coughing so much and it is sore in my chest as a result. 9. PREGNANCY: "Is there any chance you are pregnant?" "When was your last menstrual period?"     N/A  Protocols used: Sinus Pain or Congestion-A-AH

## 2023-11-18 ENCOUNTER — Ambulatory Visit: Payer: 59 | Admitting: Podiatry

## 2023-11-25 ENCOUNTER — Ambulatory Visit: Payer: 59 | Admitting: Nurse Practitioner

## 2023-11-25 ENCOUNTER — Ambulatory Visit: Payer: 59

## 2023-11-25 ENCOUNTER — Ambulatory Visit (INDEPENDENT_AMBULATORY_CARE_PROVIDER_SITE_OTHER): Payer: 59 | Admitting: Urology

## 2023-11-25 ENCOUNTER — Encounter: Payer: Self-pay | Admitting: Urology

## 2023-11-25 ENCOUNTER — Telehealth: Payer: 59 | Admitting: Physician Assistant

## 2023-11-25 ENCOUNTER — Telehealth: Payer: Self-pay

## 2023-11-25 VITALS — BP 105/71 | HR 76 | Ht 69.0 in | Wt 191.0 lb

## 2023-11-25 DIAGNOSIS — R1031 Right lower quadrant pain: Secondary | ICD-10-CM

## 2023-11-25 DIAGNOSIS — J019 Acute sinusitis, unspecified: Secondary | ICD-10-CM

## 2023-11-25 DIAGNOSIS — N4889 Other specified disorders of penis: Secondary | ICD-10-CM | POA: Diagnosis not present

## 2023-11-25 DIAGNOSIS — R051 Acute cough: Secondary | ICD-10-CM | POA: Diagnosis not present

## 2023-11-25 DIAGNOSIS — B9689 Other specified bacterial agents as the cause of diseases classified elsewhere: Secondary | ICD-10-CM | POA: Diagnosis not present

## 2023-11-25 DIAGNOSIS — M545 Low back pain, unspecified: Secondary | ICD-10-CM

## 2023-11-25 DIAGNOSIS — R399 Unspecified symptoms and signs involving the genitourinary system: Secondary | ICD-10-CM | POA: Diagnosis not present

## 2023-11-25 MED ORDER — TAMSULOSIN HCL 0.4 MG PO CAPS: 0.4000 mg | ORAL_CAPSULE | Freq: Every day | ORAL | 0 refills | Status: DC

## 2023-11-25 MED ORDER — DOXYCYCLINE HYCLATE 100 MG PO TABS: 100.0000 mg | ORAL_TABLET | Freq: Two times a day (BID) | ORAL | 0 refills | Status: DC

## 2023-11-25 MED ORDER — PROMETHAZINE-DM 6.25-15 MG/5ML PO SYRP: 5.0000 mL | ORAL_SOLUTION | Freq: Four times a day (QID) | ORAL | 0 refills | Status: DC | PRN

## 2023-11-25 NOTE — Progress Notes (Signed)
I, Lance Walsh, acting as a scribe for Lance Altes, MD., have documented all relevant documentation on the behalf of Lance Altes, MD, as directed by Lance Altes, MD while in the presence of Lance Altes, MD.  11/25/2023 11:13 AM   Lance Walsh 1976/04/08 [id number]  Referring provider: Larae Grooms, NP [address] [place],  [place] [address]  Chief Complaint  Patient presents with   Other    HPI: Lance Walsh is a 47 y.o. male called for an appointment for evaluation of lower urinary tract symptoms, back pain and pelvic pain.   Seen [facility] 11/01/23 with a 2 week history of urinary frequency. He was also complaining of urinary hesitancy, weak urinary stream. He has had mid-right back pain, but also having lower abdominal discomfort, right groin pain, and penile pain from the shaft to the glands, which is worse after ejaculation. Urgent care evaluation remarkable for urinalysis, which showed no evidence of infection. There were amorphous crystals present and he was given a prescription for Pyridium.  Denies fever, chills, or gross hematuria.  Does have a prior history of stone disease, but current symptoms are not typical of his stone symptoms.   PMH: Past Medical History:  Diagnosis Date   Bipolar 1 disorder (HCC)    Calculus of kidney    Depression    Fatty liver    GERD (gastroesophageal reflux disease)    Headache    migraine and sinus   History of kidney stones    Plantar fasciitis    Sleep apnea    not using CPAP    Surgical History: Past Surgical History:  Procedure Laterality Date   COLONOSCOPY     COLONOSCOPY WITH PROPOFOL N/A 09/17/2022   Procedure: COLONOSCOPY WITH PROPOFOL;  Surgeon: Midge Minium, MD;  Location: [facility];  Service: Endoscopy;  Laterality: N/A;   NASAL SEPTOPLASTY W/ TURBINOPLASTY Bilateral 06/30/2021   Procedure: NASAL SEPTOPLASTY WITH TURBINATE REDUCTION, SUBMUCOUS RESECTION;  Surgeon:  Geanie Logan, MD;  Location: [facility];  Service: ENT;  Laterality: Bilateral;   REFRACTIVE SURGERY Bilateral 01/2022   TURBINATE REDUCTION Left 03/08/2023   Procedure: SUBMUCUSAL MIDDLE TURBINATE REDUCTION;  Surgeon: Geanie Logan, MD;  Location: [facility];  Service: ENT;  Laterality: Left;   WISDOM TOOTH EXTRACTION      Home Medications:  Allergies as of 11/25/2023       Reactions   Shellfish-derived Products Swelling   Throat Closing   Lactose Intolerance (gi) Diarrhea       Milk (cow)         Medication List        Accurate as of November 25, 2023 11:13 AM. If you have any questions, ask your nurse or doctor.          benzonatate 100 MG capsule Commonly known as: Tessalon Perles Take 1 capsule (100 mg total) by mouth 3 (three) times daily as needed.   EPINEPHrine 0.3 mg/0.3 mL Soaj injection Commonly known as: EPI-PEN Inject 0.3 mg into the muscle as needed for anaphylaxis.   Erenumab-aooe 140 MG/ML Soaj Inject into the skin every 30 (thirty) days. Takes on the 12th   fluorometholone 0.1 % ophthalmic suspension Commonly known as: FML   HYDROcodone-acetaminophen 5-325 MG tablet Commonly known as: NORCO/VICODIN Take 1 tablet by mouth every 6 (six) hours as needed for moderate pain or severe pain.   lidocaine 2 % solution Commonly known as: XYLOCAINE Use  as directed 15 mLs in the mouth or throat as needed for mouth pain.   MIEBO OP Apply to eye 2 (two) times daily.   ondansetron 4 MG disintegrating tablet Commonly known as: ZOFRAN-ODT Take 1 tablet (4 mg total) by mouth every 8 (eight) hours as needed for nausea or vomiting.   Restasis 0.05 % ophthalmic emulsion Generic drug: cycloSPORINE Place 1 drop into both eyes 4 (four) times daily.   SUMAtriptan 100 MG tablet Commonly known as: IMITREX Take 1 tablet (100 mg total) by mouth daily as needed for migraine or headache. Maximum once a day only. Not to exceed 1 tab in 24 hrs.    tamsulosin 0.4 MG Caps capsule Commonly known as: FLOMAX Take 1 capsule (0.4 mg total) by mouth daily. Started by: Lance Walsh        Allergies:  Allergies  Allergen Reactions   Shellfish-Derived Products Swelling    Throat Closing   Lactose Intolerance (Gi) Diarrhea         Milk (Cow)     Family History: Family History  Problem Relation Age of Onset   Alcohol abuse Mother    Diabetes Mother    Cancer Mother    Diabetes Sister    Cancer Sister     Social History:  reports that he has never smoked. He has never used smokeless tobacco. He reports that he does not currently use alcohol after a past usage of about 2.0 standard drinks of alcohol per week. He reports that he does not use drugs.   Physical Exam: BP 105/71   Pulse 76   Ht 5\' 9"  (1.753 m)   Wt 191 lb (86.6 kg)   BMI 28.21 kg/m   Constitutional:  Alert and oriented, No acute distress. HEENT: [place] AT Respiratory: Normal respiratory effort, no increased work of breathing. Psychiatric: Normal mood and affect.   Assessment & Plan:    47 year old male with a constellation of symptoms including back pain, lower abdominal and right groin pain, lower urinary tract symptoms, and penile pain.  We discussed possibilities of ureteral calculus and inflammatory prostatitis.  Have initially recommended a trial of tamsulosin 0.4 mg a Q day x1 month. He was instructed to call back regarding efficacy of this treatment. If having persistent symptoms will schedule a renal stone CT.   I have reviewed the above documentation for accuracy and completeness, and I agree with the above.   Lance Altes, MD  [facility] [address], [address] [place], [place] [address] [phone]

## 2023-11-25 NOTE — Progress Notes (Signed)
Patient presents today to pick up custom molded foot orthotics, diagnosed with PF by Dr. Logan Bores .   Orthotics were dispensed and fit was satisfactory. Reviewed instructions for break-in and wear. Written instructions given to patient.  Patient will follow up as needed.  Lance Walsh CPed, CFo, CFm

## 2023-11-25 NOTE — Progress Notes (Signed)
Virtual Visit Consent   [name], you are scheduled for a virtual visit with a [facility] provider today. Just as with appointments in the office, your consent must be obtained to participate. Your consent will be active for this visit and any virtual visit you may have with one of our providers in the next 365 days. If you have a MyChart account, a copy of this consent can be sent to you electronically.  As this is a virtual visit, video technology does not allow for your provider to perform a traditional examination. This may limit your provider's ability to fully assess your condition. If your provider identifies any concerns that need to be evaluated in person or the need to arrange testing (such as labs, EKG, etc.), we will make arrangements to do so. Although advances in technology are sophisticated, we cannot ensure that it will always work on either your end or our end. If the connection with a video visit is poor, the visit may have to be switched to a telephone visit. With either a video or telephone visit, we are not always able to ensure that we have a secure connection.  By engaging in this virtual visit, you consent to the provision of healthcare and authorize for your insurance to be billed (if applicable) for the services provided during this visit. Depending on your insurance coverage, you may receive a charge related to this service.  I need to obtain your verbal consent now. Are you willing to proceed with your visit today? Lance Walsh has provided verbal consent on 11/25/2023 for a virtual visit (video or telephone). [name], PA-C  Date: 11/25/2023 1:49 PM  Virtual Visit via Video Note   I, [name], connected with  Lance Walsh  ([id number], [birth year]) on 11/25/23 at  1:45 PM EST by a video-enabled telemedicine application and verified that I am speaking with the correct person using two identifiers.  Location: Patient: Virtual Visit Location  Patient: Home Provider: Virtual Visit Location Provider: Home Office   I discussed the limitations of evaluation and management by telemedicine and the availability of in person appointments. The patient expressed understanding and agreed to proceed.    History of Present Illness: Lance Walsh is a 47 y.o. who identifies as a male who was assigned male at birth, and is being seen today for continued sinus symptoms and cough.  HPI: Sinusitis This is a recurrent problem. The current episode started 1 to 4 weeks ago (seen virtually by PCP on 11/09/23 and prescribed Augmentin x 7 days, Prednisone 40mg  x 5 days, and given tessalon perles and lidocaine for symptom management. Called back 12/18 advised some improvement in sinus congestion but worsening chest congestion.). The problem has been gradually worsening since onset. There has been no fever. The pain is moderate. Associated symptoms include congestion, coughing, diaphoresis, headaches, a hoarse voice, sinus pressure (L>R), a sore throat and swollen glands. Pertinent negatives include no chills, ear pain, neck pain, shortness of breath or sneezing. Past treatments include antibiotics and lying down (mucinex d, cough drops). The treatment provided no relief.  CXR ordered but not completed.   Has seen ENT in the past and had sinus surgery.   Problems:  Patient Active Problem List   Diagnosis Date Noted   Acute non-recurrent frontal sinusitis 11/09/2023   Urinary frequency 11/01/2023   Sleep apnea 07/22/2022   Anxiety 05/25/2022   Eczema 05/25/2022   Chronic back pain 12/09/2020   Gastroesophageal  reflux disease 12/09/2020   Long-term current use of benzodiazepine 12/09/2020   Elevated low density lipoprotein (LDL) cholesterol level 12/06/2020   Allergic rhinitis 02/13/2019   Insomnia 11/13/2018   Migraine headache 12/29/2016   Depression 11/04/2015    Allergies:  Allergies  Allergen Reactions   Shellfish-Derived Products Swelling     Throat Closing   Lactose Intolerance (Gi) Diarrhea         Milk (Cow)    Medications:  Current Outpatient Medications:    doxycycline (VIBRA-TABS) 100 MG tablet, Take 1 tablet (100 mg total) by mouth 2 (two) times daily., Disp: 20 tablet, Rfl: 0   promethazine-dextromethorphan (PROMETHAZINE-DM) 6.25-15 MG/5ML syrup, Take 5 mLs by mouth 4 (four) times daily as needed., Disp: 118 mL, Rfl: 0   benzonatate (TESSALON PERLES) 100 MG capsule, Take 1 capsule (100 mg total) by mouth 3 (three) times daily as needed., Disp: 45 capsule, Rfl: 0   EPINEPHrine 0.3 mg/0.3 mL IJ SOAJ injection, Inject 0.3 mg into the muscle as needed for anaphylaxis., Disp: 1 each, Rfl: 1   Erenumab-aooe 140 MG/ML SOAJ, Inject into the skin every 30 (thirty) days. Takes on the 12th, Disp: , Rfl:    fluorometholone (FML) 0.1 % ophthalmic suspension, , Disp: , Rfl:    HYDROcodone-acetaminophen (NORCO/VICODIN) 5-325 MG tablet, Take 1 tablet by mouth every 6 (six) hours as needed for moderate pain or severe pain., Disp: 8 tablet, Rfl: 0   lidocaine (XYLOCAINE) 2 % solution, Use as directed 15 mLs in the mouth or throat as needed for mouth pain., Disp: 100 mL, Rfl: 0   ondansetron (ZOFRAN-ODT) 4 MG disintegrating tablet, Take 1 tablet (4 mg total) by mouth every 8 (eight) hours as needed for nausea or vomiting., Disp: 20 tablet, Rfl: 0   Perfluorohexyloctane (MIEBO OP), Apply to eye 2 (two) times daily., Disp: , Rfl:    RESTASIS 0.05 % ophthalmic emulsion, Place 1 drop into both eyes 4 (four) times daily., Disp: , Rfl:    SUMAtriptan (IMITREX) 100 MG tablet, Take 1 tablet (100 mg total) by mouth daily as needed for migraine or headache. Maximum once a day only. Not to exceed 1 tab in 24 hrs., Disp: 30 tablet, Rfl: 0   tamsulosin (FLOMAX) 0.4 MG CAPS capsule, Take 1 capsule (0.4 mg total) by mouth daily., Disp: 30 capsule, Rfl: 0  Observations/Objective: Patient is well-developed, well-nourished in no acute distress.  Resting  comfortably at home.  Head is normocephalic, atraumatic.  No labored breathing.  Speech is clear and coherent with logical content.  Patient is alert and oriented at baseline.    Assessment and Plan: 1. Acute bacterial sinusitis (Primary) - doxycycline (VIBRA-TABS) 100 MG tablet; Take 1 tablet (100 mg total) by mouth 2 (two) times daily.  Dispense: 20 tablet; Refill: 0  2. Acute cough - promethazine-dextromethorphan (PROMETHAZINE-DM) 6.25-15 MG/5ML syrup; Take 5 mLs by mouth 4 (four) times daily as needed.  Dispense: 118 mL; Refill: 0  - Worsening symptoms that have not responded to OTC medications.  - Will give Doxycycline - Promethazine DM for cough - Continue Mucinex - Continue allergy medications.  - Steam and humidifier can help - Stay well hydrated and get plenty of rest.  - Seek in person evaluation if no symptom improvement or if symptoms worsen   Follow Up Instructions: I discussed the assessment and treatment plan with the patient. The patient was provided an opportunity to ask questions and all were answered. The patient agreed with the plan and  demonstrated an understanding of the instructions.  A copy of instructions were sent to the patient via MyChart unless otherwise noted below.    The patient was advised to call back or seek an in-person evaluation if the symptoms worsen or if the condition fails to improve as anticipated.    [name], PA-C

## 2023-11-25 NOTE — Telephone Encounter (Signed)
Inserts are in BTON

## 2023-11-25 NOTE — Patient Instructions (Signed)
Lance Walsh, thank you for joining Lance Loveless, PA-C for today's virtual visit.  While this provider is not your primary care provider (PCP), if your PCP is located in our provider database this encounter information will be shared with them immediately following your visit.   A [facility] MyChart account gives you access to today's visit and all your visits, tests, and labs performed at [facility] " click here if you don't have a [facility] MyChart account or go to [email or web address]  Consent: (Patient) Lance Walsh provided verbal consent for this virtual visit at the beginning of the encounter.  Current Medications:  Current Outpatient Medications:    doxycycline (VIBRA-TABS) 100 MG tablet, Take 1 tablet (100 mg total) by mouth 2 (two) times daily., Disp: 20 tablet, Rfl: 0   promethazine-dextromethorphan (PROMETHAZINE-DM) 6.25-15 MG/5ML syrup, Take 5 mLs by mouth 4 (four) times daily as needed., Disp: 118 mL, Rfl: 0   benzonatate (TESSALON PERLES) 100 MG capsule, Take 1 capsule (100 mg total) by mouth 3 (three) times daily as needed., Disp: 45 capsule, Rfl: 0   EPINEPHrine 0.3 mg/0.3 mL IJ SOAJ injection, Inject 0.3 mg into the muscle as needed for anaphylaxis., Disp: 1 each, Rfl: 1   Erenumab-aooe 140 MG/ML SOAJ, Inject into the skin every 30 (thirty) days. Takes on the 12th, Disp: , Rfl:    fluorometholone (FML) 0.1 % ophthalmic suspension, , Disp: , Rfl:    HYDROcodone-acetaminophen (NORCO/VICODIN) 5-325 MG tablet, Take 1 tablet by mouth every 6 (six) hours as needed for moderate pain or severe pain., Disp: 8 tablet, Rfl: 0   lidocaine (XYLOCAINE) 2 % solution, Use as directed 15 mLs in the mouth or throat as needed for mouth pain., Disp: 100 mL, Rfl: 0   ondansetron (ZOFRAN-ODT) 4 MG disintegrating tablet, Take 1 tablet (4 mg total) by mouth every 8 (eight) hours as needed for nausea or vomiting., Disp: 20 tablet, Rfl: 0   Perfluorohexyloctane (MIEBO OP),  Apply to eye 2 (two) times daily., Disp: , Rfl:    RESTASIS 0.05 % ophthalmic emulsion, Place 1 drop into both eyes 4 (four) times daily., Disp: , Rfl:    SUMAtriptan (IMITREX) 100 MG tablet, Take 1 tablet (100 mg total) by mouth daily as needed for migraine or headache. Maximum once a day only. Not to exceed 1 tab in 24 hrs., Disp: 30 tablet, Rfl: 0   tamsulosin (FLOMAX) 0.4 MG CAPS capsule, Take 1 capsule (0.4 mg total) by mouth daily., Disp: 30 capsule, Rfl: 0   Medications ordered in this encounter:  Meds ordered this encounter  Medications   doxycycline (VIBRA-TABS) 100 MG tablet    Sig: Take 1 tablet (100 mg total) by mouth 2 (two) times daily.    Dispense:  20 tablet    Refill:  0    Supervising Provider:   Merrilee Jansky [[id number]]   promethazine-dextromethorphan (PROMETHAZINE-DM) 6.25-15 MG/5ML syrup    Sig: Take 5 mLs by mouth 4 (four) times daily as needed.    Dispense:  118 mL    Refill:  0    Supervising Provider:   Merrilee Jansky [[id number]]     *If you need refills on other medications prior to your next appointment, please contact your pharmacy*  Follow-Up: Call back or seek an in-person evaluation if the symptoms worsen or if the condition fails to improve as anticipated.  [facility] [phone]  Other Instructions Sinus Infection, Adult A sinus infection, also  called sinusitis, is inflammation of your sinuses. Sinuses are hollow spaces in the bones around your face. Your sinuses are located: Around your eyes. In the middle of your forehead. Behind your nose. In your cheekbones. Mucus normally drains out of your sinuses. When your nasal tissues become inflamed or swollen, mucus can become trapped or blocked. This allows bacteria, viruses, and fungi to grow, which leads to infection. Most infections of the sinuses are caused by a virus. A sinus infection can develop quickly. It can last for up to 4 weeks (acute) or for more than 12 weeks  (chronic). A sinus infection often develops after a cold. What are the causes? This condition is caused by anything that creates swelling in the sinuses or stops mucus from draining. This includes: Allergies. Asthma. Infection from bacteria or viruses. Deformities or blockages in your nose or sinuses. Abnormal growths in the nose (nasal polyps). Pollutants, such as chemicals or irritants in the air. Infection from fungi. This is rare. What increases the risk? You are more likely to develop this condition if you: Have a weak body defense system (immune system). Do a lot of swimming or diving. Overuse nasal sprays. Smoke. What are the signs or symptoms? The main symptoms of this condition are pain and a feeling of pressure around the affected sinuses. Other symptoms include: Stuffy nose or congestion that makes it difficult to breathe through your nose. Thick yellow or greenish drainage from your nose. Tenderness, swelling, and warmth over the affected sinuses. A cough that may get worse at night. Decreased sense of smell and taste. Extra mucus that collects in the throat or the back of the nose (postnasal drip) causing a sore throat or bad breath. Tiredness (fatigue). Fever. How is this diagnosed? This condition is diagnosed based on: Your symptoms. Your medical history. A physical exam. Tests to find out if your condition is acute or chronic. This may include: Checking your nose for nasal polyps. Viewing your sinuses using a device that has a light (endoscope). Testing for allergies or bacteria. Imaging tests, such as an MRI or CT scan. In rare cases, a bone biopsy may be done to rule out more serious types of fungal sinus disease. How is this treated? Treatment for a sinus infection depends on the cause and whether your condition is chronic or acute. If caused by a virus, your symptoms should go away on their own within 10 days. You may be given medicines to relieve symptoms.  They include: Medicines that shrink swollen nasal passages (decongestants). A spray that eases inflammation of the nostrils (topical intranasal corticosteroids). Rinses that help get rid of thick mucus in your nose (nasal saline washes). Medicines that treat allergies (antihistamines). Over-the-counter pain relievers. If caused by bacteria, your health care provider may recommend waiting to see if your symptoms improve. Most bacterial infections will get better without antibiotic medicine. You may be given antibiotics if you have: A severe infection. A weak immune system. If caused by narrow nasal passages or nasal polyps, surgery may be needed. Follow these instructions at home: Medicines Take, use, or apply over-the-counter and prescription medicines only as told by your health care provider. These may include nasal sprays. If you were prescribed an antibiotic medicine, take it as told by your health care provider. Do not stop taking the antibiotic even if you start to feel better. Hydrate and humidify  Drink enough fluid to keep your urine pale yellow. Staying hydrated will help to thin your mucus. Use a  cool mist humidifier to keep the humidity level in your home above 50%. Inhale steam for 10-15 minutes, 3-4 times a day, or as told by your health care provider. You can do this in the bathroom while a hot shower is running. Limit your exposure to cool or dry air. Rest Rest as much as possible. Sleep with your head raised (elevated). Make sure you get enough sleep each night. General instructions  Apply a warm, moist washcloth to your face 3-4 times a day or as told by your health care provider. This will help with discomfort. Use nasal saline washes as often as told by your health care provider. Wash your hands often with soap and water to reduce your exposure to germs. If soap and water are not available, use hand sanitizer. Do not smoke. Avoid being around people who are smoking  (secondhand smoke). Keep all follow-up visits. This is important. Contact a health care provider if: You have a fever. Your symptoms get worse. Your symptoms do not improve within 10 days. Get help right away if: You have a severe headache. You have persistent vomiting. You have severe pain or swelling around your face or eyes. You have vision problems. You develop confusion. Your neck is stiff. You have trouble breathing. These symptoms may be an emergency. Get help right away. Call 911. Do not wait to see if the symptoms will go away. Do not drive yourself to the hospital. Summary A sinus infection is soreness and inflammation of your sinuses. Sinuses are hollow spaces in the bones around your face. This condition is caused by nasal tissues that become inflamed or swollen. The swelling traps or blocks the flow of mucus. This allows bacteria, viruses, and fungi to grow, which leads to infection. If you were prescribed an antibiotic medicine, take it as told by your health care provider. Do not stop taking the antibiotic even if you start to feel better. Keep all follow-up visits. This is important. This information is not intended to replace advice given to you by your health care provider. Make sure you discuss any questions you have with your health care provider. Document Revised: 10/20/2021 Document Reviewed: 10/20/2021 Elsevier Patient Education  2024 Elsevier Inc.    If you have been instructed to have an in-person evaluation today at a local Urgent Care facility, please use the link below. It will take you to a list of all of our available [facility], including address, phone number and hours of operation. Please do not delay care.  [facility]  If you or a family member do not have a primary care provider, use the link below to schedule a visit and establish care. When you choose a [facility] primary care physician or advanced practice provider,  you gain a long-term partner in health. Find a Primary Care Provider  Learn more about [facility]'s in-office and virtual care options: [facility] Get Care Now

## 2023-12-29 ENCOUNTER — Other Ambulatory Visit: Payer: Self-pay | Admitting: Urology

## 2024-01-03 ENCOUNTER — Telehealth: Payer: Self-pay | Admitting: Urology

## 2024-01-03 NOTE — Telephone Encounter (Signed)
Pt would like to get an order to have renal stone CT.

## 2024-01-04 ENCOUNTER — Other Ambulatory Visit: Payer: Self-pay | Admitting: *Deleted

## 2024-01-04 DIAGNOSIS — R1084 Generalized abdominal pain: Secondary | ICD-10-CM

## 2024-01-04 NOTE — Telephone Encounter (Signed)
 The order is in the scheduling team will call him in  a week or two to schedule. Thanks

## 2024-01-24 ENCOUNTER — Ambulatory Visit
Admission: RE | Admit: 2024-01-24 | Discharge: 2024-01-24 | Disposition: A | Discharge status: Home / Self Care | Payer: 59 | Source: Ambulatory Visit | Attending: Urology | Admitting: Urology

## 2024-01-24 DIAGNOSIS — R1084 Generalized abdominal pain: Secondary | ICD-10-CM | POA: Diagnosis present

## 2024-02-07 ENCOUNTER — Ambulatory Visit: Admitting: Podiatry

## 2024-02-07 ENCOUNTER — Ambulatory Visit (INDEPENDENT_AMBULATORY_CARE_PROVIDER_SITE_OTHER)

## 2024-02-07 ENCOUNTER — Encounter: Payer: Self-pay | Admitting: Podiatry

## 2024-02-07 DIAGNOSIS — M778 Other enthesopathies, not elsewhere classified: Secondary | ICD-10-CM | POA: Diagnosis not present

## 2024-02-07 DIAGNOSIS — M7751 Other enthesopathy of right foot: Secondary | ICD-10-CM | POA: Diagnosis not present

## 2024-02-07 DIAGNOSIS — M216X2 Other acquired deformities of left foot: Secondary | ICD-10-CM

## 2024-02-07 DIAGNOSIS — M216X1 Other acquired deformities of right foot: Secondary | ICD-10-CM | POA: Diagnosis not present

## 2024-02-07 DIAGNOSIS — M722 Plantar fascial fibromatosis: Secondary | ICD-10-CM | POA: Diagnosis not present

## 2024-02-07 DIAGNOSIS — M7750 Other enthesopathy of unspecified foot: Secondary | ICD-10-CM

## 2024-02-07 MED ORDER — MELOXICAM 15 MG PO TABS: 15.0000 mg | ORAL_TABLET | Freq: Every day | ORAL | 1 refills | Status: DC

## 2024-02-16 NOTE — Progress Notes (Signed)
 Chief Complaint  Patient presents with   Routine Post Op    endoscopic plantar fasciotomy bilateral. DOS: 01/27/2023 "The surgery part is doing good.  Now, it's the new stuff.  The ankles are bothering me and my left heel." N - ankle pain L - bilateral D - since last visit O - gotten worse C - sore A - walking, especially when I get up in the morning T - heat, ice packs, stretching exercises    Subjective:  Patient h/o endoscopic plantar fasciotomy.  DOS: 01/27/2023 presenting for follow-up evaluation of recalcitrant plantar fasciitis bilateral heels.  Patient continues to have residual foot pain secondary to work and being on his feet all day.  Past Medical History:  Diagnosis Date   Bipolar 1 disorder (HCC)    Calculus of kidney    Depression    Fatty liver    GERD (gastroesophageal reflux disease)    Headache    migraine and sinus   History of kidney stones    Plantar fasciitis    Sleep apnea    not using CPAP    Past Surgical History:  Procedure Laterality Date   COLONOSCOPY     COLONOSCOPY WITH PROPOFOL N/A 09/17/2022   Procedure: COLONOSCOPY WITH PROPOFOL;  Surgeon: Midge Minium, MD;  Location: [facility];  Service: Endoscopy;  Laterality: N/A;   NASAL SEPTOPLASTY W/ TURBINOPLASTY Bilateral 06/30/2021   Procedure: NASAL SEPTOPLASTY WITH TURBINATE REDUCTION, SUBMUCOUS RESECTION;  Surgeon: Geanie Logan, MD;  Location: [facility];  Service: ENT;  Laterality: Bilateral;   REFRACTIVE SURGERY Bilateral 01/2022   TURBINATE REDUCTION Left 03/08/2023   Procedure: SUBMUCUSAL MIDDLE TURBINATE REDUCTION;  Surgeon: Geanie Logan, MD;  Location: [facility];  Service: ENT;  Laterality: Left;   WISDOM TOOTH EXTRACTION      Allergies  Allergen Reactions   Shellfish-Derived Products Swelling    Throat Closing   Lactose Intolerance (Gi) Diarrhea         Milk (Cow)     Objective/Physical Exam Physical Exam General: The patient is alert and  oriented x3 in no acute distress.  Dermatology: Skin is cool, dry and supple bilateral lower extremities. Negative for open lesions or macerations.  Vascular: Palpable pedal pulses bilaterally. No edema or erythema noted. Capillary refill within normal limits.  Neurological: Grossly intact via light touch  Musculoskeletal Exam: There continues to be some tenderness with palpation along the plantar medial aspect of the left heel.   Consistent with recalcitrant plantar fasciitis.  Likely secondary to work demands and being on his feet for 12+ hours per shift. Today there is also some tenderness with palpation to the right ankle possibly secondary to compensation.  Muscle strength 5/5 all compartments.  Negative anterior drawer.  No instability of the ankle.  There is also some limited ankle joint dorsiflexion and tight posterior complex consistent with an equinus   Assessment: 1. H/o endoscopic plantar fasciotomy bilateral. DOS: 01/27/2023 2.  Residual plantar fasciitis bilateral 3.  Capsulitis right ankle 4.  Equinus bilateral lower extremities  -Patient evaluated -Injection of 0.5 cc Celestone Soluspan injected into the plantar fascia left and right ankle joint - Continue meloxicam 15 mg daily as needed.  Refill provided - Orthotics dispensable pending.  He was molded for custom orthotics last visit -Stressed the importance of daily stretching exercises -Return to clinic for orthotics pickup.  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 Felecia Shelling, DPM    [address] [place], [place] [address]                Office [phone]  Fax [phone]

## 2024-02-22 DIAGNOSIS — M7751 Other enthesopathy of right foot: Secondary | ICD-10-CM

## 2024-02-22 DIAGNOSIS — M722 Plantar fascial fibromatosis: Secondary | ICD-10-CM

## 2024-02-22 MED ORDER — BETAMETHASONE SOD PHOS & ACET 6 (3-3) MG/ML IJ SUSP
3.0000 mg | Freq: Once | INTRAMUSCULAR | Status: AC
  Administered 2024-02-22: 3 mg via INTRA_ARTICULAR

## 2024-03-08 ENCOUNTER — Other Ambulatory Visit: Payer: Self-pay | Admitting: Student

## 2024-03-08 DIAGNOSIS — G43019 Migraine without aura, intractable, without status migrainosus: Secondary | ICD-10-CM

## 2024-03-09 ENCOUNTER — Other Ambulatory Visit: Payer: Self-pay | Admitting: Student

## 2024-03-09 DIAGNOSIS — G43019 Migraine without aura, intractable, without status migrainosus: Secondary | ICD-10-CM

## 2024-03-12 ENCOUNTER — Ambulatory Visit: Admission: RE | Admit: 2024-03-12 | Source: Ambulatory Visit

## 2024-03-15 ENCOUNTER — Ambulatory Visit
Admission: RE | Admit: 2024-03-15 | Discharge: 2024-03-15 | Disposition: A | Discharge status: Home / Self Care | Source: Ambulatory Visit | Attending: Student | Admitting: Student

## 2024-03-15 DIAGNOSIS — G43019 Migraine without aura, intractable, without status migrainosus: Secondary | ICD-10-CM | POA: Insufficient documentation

## 2024-03-23 ENCOUNTER — Ambulatory Visit: Admitting: Podiatry

## 2024-04-03 ENCOUNTER — Telehealth: Payer: Self-pay

## 2024-04-03 ENCOUNTER — Encounter: Payer: Self-pay | Admitting: Podiatry

## 2024-04-03 ENCOUNTER — Ambulatory Visit: Admitting: Podiatry

## 2024-04-03 DIAGNOSIS — M216X2 Other acquired deformities of left foot: Secondary | ICD-10-CM

## 2024-04-03 DIAGNOSIS — M216X1 Other acquired deformities of right foot: Secondary | ICD-10-CM

## 2024-04-03 DIAGNOSIS — M722 Plantar fascial fibromatosis: Secondary | ICD-10-CM

## 2024-04-03 DIAGNOSIS — M7751 Other enthesopathy of right foot: Secondary | ICD-10-CM

## 2024-04-03 MED ORDER — BETAMETHASONE SOD PHOS & ACET 6 (3-3) MG/ML IJ SUSP: 3.0000 mg | Freq: Once | INTRAMUSCULAR | Status: DC

## 2024-04-03 NOTE — Telephone Encounter (Signed)
 Patient came in and I gave him his orthotics that were in the drawer.

## 2024-04-03 NOTE — Progress Notes (Signed)
 Chief Complaint  Patient presents with   Foot Pain    "A little better, my ankles still bother me but they're no where like they were."    Subjective:  Patient h/o endoscopic plantar fasciotomy.  DOS: 01/27/2023 presenting for follow-up evaluation of recalcitrant plantar fasciitis bilateral heels as well as right ankle pain.  Overall he has noticed significant improvement.  He continues to have some pain and tenderness to the anterior lateral aspect of the right ankle as well as the plantar fascia left however.  Past Medical History:  Diagnosis Date   Bipolar 1 disorder (HCC)    Calculus of kidney    Depression    Fatty liver    GERD (gastroesophageal reflux disease)    Headache    migraine and sinus   History of kidney stones    Plantar fasciitis    Sleep apnea    not using CPAP    Past Surgical History:  Procedure Laterality Date   COLONOSCOPY     COLONOSCOPY WITH PROPOFOL  N/A 09/17/2022   Procedure: COLONOSCOPY WITH PROPOFOL ;  Surgeon: Marnee Sink, MD;  Location: [facility];  Service: Endoscopy;  Laterality: N/A;   NASAL SEPTOPLASTY W/ TURBINOPLASTY Bilateral 06/30/2021   Procedure: NASAL SEPTOPLASTY WITH TURBINATE REDUCTION, SUBMUCOUS RESECTION;  Surgeon: Von Grumbling, MD;  Location: [facility];  Service: ENT;  Laterality: Bilateral;   REFRACTIVE SURGERY Bilateral 01/2022   TURBINATE REDUCTION Left 03/08/2023   Procedure: SUBMUCUSAL MIDDLE TURBINATE REDUCTION;  Surgeon: Von Grumbling, MD;  Location: [facility];  Service: ENT;  Laterality: Left;   WISDOM TOOTH EXTRACTION      Allergies  Allergen Reactions   Shellfish-Derived Products Swelling    Throat Closing   Lactose Intolerance (Gi) Diarrhea         Milk (Cow)     Objective/Physical Exam Physical Exam General: The patient is alert and oriented x3 in no acute distress.  Dermatology: Skin is cool, dry and supple bilateral lower extremities. Negative for open lesions or  macerations.  Vascular: Palpable pedal pulses bilaterally. No edema or erythema noted. Capillary refill within normal limits.  Neurological: Grossly intact via light touch  Musculoskeletal Exam: There continues to be some tenderness with palpation along the plantar medial aspect of the left heel as well as the anterolateral aspect of the right ankle.   Likely secondary to work demands and being on his feet for 12+ hours per shift. Muscle strength 5/5 all compartments.  Negative anterior drawer.  No instability of the ankle.  There is also some limited ankle joint dorsiflexion and tight posterior complex consistent with an equinus   Assessment: 1. H/o endoscopic plantar fasciotomy bilateral. DOS: 01/27/2023 2.  Residual plantar fasciitis bilateral 3.  Capsulitis right ankle 4.  Equinus bilateral lower extremities  -Patient evaluated -Injection of 0.5 cc Celestone  Soluspan injected into the plantar fascia left and right ankle joint - Continue meloxicam  15 mg daily as needed.  Refill provided - Orthotics dispensed today.  WBAT in good supportive tennis shoes and sneakers.  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 Dot Gazella, DPM    [address]  [place], [place] [address]                Office [phone]  Fax [phone]

## 2024-05-31 ENCOUNTER — Other Ambulatory Visit: Payer: Self-pay | Admitting: Podiatry

## 2024-06-25 ENCOUNTER — Telehealth: Payer: Self-pay

## 2024-06-25 NOTE — Telephone Encounter (Signed)
 Pt called. Stated he has been calling to get his charge for his orthotics removed. He stated multiple people previously told him that there would be no charge and when someone does remove the charge, allegedly the charge pops up again. He states he's very frustrated and that had he known it would be $542, he would not have ordered the orthotics.

## 2024-07-12 ENCOUNTER — Encounter: Payer: Self-pay | Admitting: Nurse Practitioner

## 2024-07-24 ENCOUNTER — Ambulatory Visit: Admitting: Podiatry

## 2024-07-24 ENCOUNTER — Encounter: Payer: Self-pay | Admitting: Podiatry

## 2024-07-24 VITALS — Ht 69.0 in | Wt 191.0 lb

## 2024-07-24 DIAGNOSIS — M19071 Primary osteoarthritis, right ankle and foot: Secondary | ICD-10-CM

## 2024-07-24 DIAGNOSIS — M19072 Primary osteoarthritis, left ankle and foot: Secondary | ICD-10-CM

## 2024-07-24 DIAGNOSIS — L600 Ingrowing nail: Secondary | ICD-10-CM | POA: Diagnosis not present

## 2024-07-24 DIAGNOSIS — M722 Plantar fascial fibromatosis: Secondary | ICD-10-CM

## 2024-07-24 MED ORDER — BETAMETHASONE SOD PHOS & ACET 6 (3-3) MG/ML IJ SUSP
3.0000 mg | Freq: Once | INTRAMUSCULAR | Status: AC
  Administered 2024-07-24: 3 mg via INTRA_ARTICULAR

## 2024-07-24 MED ORDER — DOXYCYCLINE HYCLATE 100 MG PO TABS: 100.0000 mg | ORAL_TABLET | Freq: Two times a day (BID) | ORAL | 0 refills | Status: DC

## 2024-07-24 NOTE — Progress Notes (Signed)
 No chief complaint on file.   Subjective:  Patient presenting today for follow-up evaluation of bilateral ankle and foot pain.  He also states that he has been developing ingrown toenails to the third digits bilateral.   Past Medical History:  Diagnosis Date   Bipolar 1 disorder (HCC)    Calculus of kidney    Depression    Fatty liver    GERD (gastroesophageal reflux disease)    Headache    migraine and sinus   History of kidney stones    Plantar fasciitis    Sleep apnea    not using CPAP    Past Surgical History:  Procedure Laterality Date   COLONOSCOPY     COLONOSCOPY WITH PROPOFOL  N/A 09/17/2022   Procedure: COLONOSCOPY WITH PROPOFOL ;  Surgeon: Jinny Carmine, MD;  Location: [facility];  Service: Endoscopy;  Laterality: N/A;   NASAL SEPTOPLASTY W/ TURBINOPLASTY Bilateral 06/30/2021   Procedure: NASAL SEPTOPLASTY WITH TURBINATE REDUCTION, SUBMUCOUS RESECTION;  Surgeon: Blair Mt, MD;  Location: [facility];  Service: ENT;  Laterality: Bilateral;   REFRACTIVE SURGERY Bilateral 01/2022   TURBINATE REDUCTION Left 03/08/2023   Procedure: SUBMUCUSAL MIDDLE TURBINATE REDUCTION;  Surgeon: Blair Mt, MD;  Location: [facility];  Service: ENT;  Laterality: Left;   WISDOM TOOTH EXTRACTION      Allergies  Allergen Reactions   Shellfish-Derived Products Swelling    Throat Closing   Lactose Intolerance (Gi) Diarrhea         Milk (Cow)     Objective/Physical Exam Physical Exam General: The patient is alert and oriented x3 in no acute distress.  Dermatology: Skin is cool, dry and supple bilateral lower extremities Ingrowing portion of nail noted to the third digits bilateral with associated tenderness  Vascular: Palpable pedal pulses bilaterally. No edema or erythema noted. Capillary refill within normal limits.  Neurological: Grossly intact via light touch  Musculoskeletal Exam: There continues to be some tenderness with palpation along the  plantar medial aspect of the left heel as well as the anterolateral aspect of the right ankle.   Likely secondary to work demands and being on his feet for 12+ hours per shift. Muscle strength 5/5 all compartments.  Negative anterior drawer.  No instability of the ankle.  There is also some limited ankle joint dorsiflexion and tight posterior complex consistent with an equinus   Assessment: 1. H/o endoscopic plantar fasciotomy bilateral. DOS: 01/27/2023 2.  Residual plantar fasciitis left 3.  Arthritis bilateral ankles 4.  Ingrown toenail third digit bilateral  -Patient evaluated -Injection of 0.5 cc Celestone  Soluspan injected into the plantar fascia left and anterolateral aspect of the bilateral ankles - Continue meloxicam  15 mg daily as needed.   - Continue custom molded orthotics -Patient has had good success with total permanent nail avulsion in the past to the lesser digits of the 4th and 5th digits bilateral.  Today we discussed total nail matricectomy to the third digit bilateral.  He is amenable to this plan.  The toes were prepped in aseptic manner and digital block performed using 3 mL of 2% lidocaine  plain.  The nails were avulsed in their entirety followed by 3 x 30-second application of phenol and alcohol flush.  Dressings applied.  Post care instructions provided. 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 Thresa EMERSON Sar, DPM  [address] [place], [place] [address]                Office [phone]  Fax [phone]

## 2024-08-14 ENCOUNTER — Ambulatory Visit: Admitting: Podiatry

## 2024-08-14 DIAGNOSIS — L600 Ingrowing nail: Secondary | ICD-10-CM

## 2024-08-14 DIAGNOSIS — M19072 Primary osteoarthritis, left ankle and foot: Secondary | ICD-10-CM | POA: Diagnosis not present

## 2024-08-14 MED ORDER — BETAMETHASONE SOD PHOS & ACET 6 (3-3) MG/ML IJ SUSP
3.0000 mg | Freq: Once | INTRAMUSCULAR | Status: AC
  Administered 2024-08-14: 3 mg via INTRA_ARTICULAR

## 2024-08-14 MED ORDER — SILVER SULFADIAZINE 1 % EX CREA: 1.0000 | TOPICAL_CREAM | Freq: Every day | CUTANEOUS | 1 refills | Status: AC

## 2024-08-14 NOTE — Progress Notes (Signed)
   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  He also continues to have some left ankle pain.  Injections have helped previously  Past Medical History:  Diagnosis Date   Bipolar 1 disorder (HCC)    Calculus of kidney    Depression    Fatty liver    GERD (gastroesophageal reflux disease)    Headache    migraine and sinus   History of kidney stones    Plantar fasciitis    Sleep apnea    not using CPAP    Past Surgical History:  Procedure Laterality Date   COLONOSCOPY     COLONOSCOPY WITH PROPOFOL  N/A 09/17/2022   Procedure: COLONOSCOPY WITH PROPOFOL ;  Surgeon: Jinny Carmine, MD;  Location: [facility];  Service: Endoscopy;  Laterality: N/A;   NASAL SEPTOPLASTY W/ TURBINOPLASTY Bilateral 06/30/2021   Procedure: NASAL SEPTOPLASTY WITH TURBINATE REDUCTION, SUBMUCOUS RESECTION;  Surgeon: Blair Mt, MD;  Location: [facility];  Service: ENT;  Laterality: Bilateral;   REFRACTIVE SURGERY Bilateral 01/2022   TURBINATE REDUCTION Left 03/08/2023   Procedure: SUBMUCUSAL MIDDLE TURBINATE REDUCTION;  Surgeon: Blair Mt, MD;  Location: [facility];  Service: ENT;  Laterality: Left;   WISDOM TOOTH EXTRACTION      Allergies  Allergen Reactions   Shellfish-Derived Products Swelling    Throat Closing   Lactose Intolerance (Gi) Diarrhea         Milk (Cow)     Objective/Physical Exam Physical Exam General: The patient is alert and oriented x3 in no acute distress.  Dermatology: Absence of the third toenails noted bilateral with routine healing after permanent total nail matricectomy  Vascular: Palpable pedal pulses bilaterally. No edema or erythema noted. Capillary refill within normal limits.  Neurological: Grossly intact via light touch  Musculoskeletal Exam: Today there is  tenderness to palpation to the anterior lateral aspect of the left ankle joint   likely secondary to work demands and being on his feet for 12+ hours per shift. Muscle strength 5/5 all compartments.  Negative anterior drawer.  No instability of the ankle.    Assessment: 1. H/o endoscopic plantar fasciotomy bilateral. DOS: 01/27/2023 2.  Residual plantar fasciitis left 3.  Arthritis bilateral ankles. LT > RT 4.  H/o partial nail matricectomy 3rd digit B/L. Medial aspect RT 5th toe  -Patient evaluated -Injection of 0.5 cc Celestone  Soluspan injected into the anterior lateral aspect of the left ankle joint - Continue meloxicam  15 mg daily as needed.   - Continue custom molded orthotics - Light debridement of the nail matricectomy sites was performed today using a tissue nipper.  Overall they appear to be healing nicely.  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 Thresa EMERSON Sar, DPM    [address] [place], [place] [address]                Office [phone]  Fax [phone]

## 2024-08-20 ENCOUNTER — Other Ambulatory Visit: Payer: Self-pay | Admitting: Podiatry

## 2024-10-04 ENCOUNTER — Ambulatory Visit

## 2024-10-04 ENCOUNTER — Ambulatory Visit: Payer: Self-pay

## 2024-10-04 VITALS — BP 118/80 | HR 74 | Ht 69.0 in | Wt 201.2 lb

## 2024-10-04 DIAGNOSIS — J0110 Acute frontal sinusitis, unspecified: Secondary | ICD-10-CM

## 2024-10-04 MED ORDER — AMOXICILLIN-POT CLAVULANATE 875-125 MG PO TABS: 1.0000 | ORAL_TABLET | Freq: Two times a day (BID) | ORAL | 0 refills | Status: AC

## 2024-10-04 NOTE — Progress Notes (Signed)
 Acute Patient Visit  Physician: Lance Carrero A [name], MD  Patient: Lance Walsh MRN: [id number] DOB: 1976-02-15  PCP: Lance Pao, NP     Subjective:   Chief Complaint  Patient presents with   Sinusitis    Patient stated he has sinus infection for 2 weeks. But now he has green mucus.     HPI: The patient is a 48 y.o. male who presents today for:   Discussed the use of AI scribe software for clinical note transcription with the patient, who gave verbal consent to proceed.  History of Present Illness   Lance Walsh is a 48 year old male with a history of sinus issues who presents with symptoms of a sinus infection.  Sinonasal symptoms - Nasal congestion for the past couple of weeks - Progression to green nasal discharge over the last few days - Facial pain and headaches - History of chronic sinus issues with two prior sinus surgeries, resulting in reduced frequency of infections - Regular use of nasal sprays and nasal flushes for symptom management  Oropharyngeal and respiratory symptoms - Sore throat with a raw sensation, particularly in the morning - Nighttime coug  Systemic symptoms - Mild fever the previous night, managed with showers - No significant chills      ROS:   As noted in the HPI    ASSESMENT/PLAN:  No diagnosis found.  No orders of the defined types were placed in this encounter.   Assessment and Plan    Acute on chronic sinusitis   Prescribe amoxicillin . Advise Cepacol for sore throat and recommend Delsym for nighttime cough. Suggest honey for throat discomfort. Instruct to contact if symptoms worsen or do not improve.            OBJECTIVE: Vitals:   10/04/24 1605  BP: 118/80  Pulse: 74  SpO2: 95%  Weight: 201 lb 3.2 oz (91.3 kg)  Height: 5' 9 (1.753 m)    Body mass index is 29.71 kg/m.   Physical Exam Vitals reviewed.  Constitutional:      Appearance: Normal appearance. Well-developed with normal weight.   Cardiovascular:     Rate and Rhythm: Normal rate and regular rhythm. Normal heart sounds. Normal peripheral pulses Pulmonary:     Normal breath sounds with normal effort Skin:    General: Skin is warm and dry without noticeable rash. Neurological:     General: No focal deficit present.  Psychiatric:        Mood and Affect: Mood, behavior and cognition normal    HEENT:  No significant erythema or exudates posterior pharynx   Allergies Patient is allergic to shellfish protein-containing drug products, lactose intolerance (gi), and milk (cow).  Past Medical History Patient  has a past medical history of Bipolar 1 disorder (HCC), Calculus of kidney, Depression, Fatty liver, GERD (gastroesophageal reflux disease), Headache, History of kidney stones, Plantar fasciitis, and Sleep apnea.  Surgical History Patient  has a past surgical history that includes Wisdom tooth extraction; Nasal septoplasty w/ turbinoplasty (Bilateral, 06/30/2021); Colonoscopy; Colonoscopy with propofol  (N/A, 09/17/2022); Refractive surgery (Bilateral, 01/2022); and Turbinate reduction (Left, 03/08/2023).  Family History Pateint's family history includes Alcohol abuse in his mother; Cancer in his mother and sister; Diabetes in his mother and sister.  Social History Patient  reports that he has never smoked. He has never used smokeless tobacco. He reports that he does not currently use alcohol after a past usage of about 2.0 standard drinks of alcohol per week.  He reports that he does not use drugs.    10/04/2024

## 2024-10-04 NOTE — Telephone Encounter (Signed)
 FYI Only or Action Required?: FYI only for provider: appointment scheduled on 11/6. At [facility]  Patient was last seen in primary care on 11/25/2023 by Vivienne Delon HERO, PA-C.  Called Nurse Triage reporting Cough.  Symptoms began several weeks ago.  Interventions attempted: OTC medications: sudafed, mucinex, and tylenol  and Rest, hydration, or home remedies.  Symptoms are: gradually worsening.  Triage Disposition: See PCP When Office is Open (Within 3 Days)  Patient/caregiver understands and will follow disposition?: Yes  Message from Zy'onna H sent at 10/04/2024  8:32 AM EST  Reason for Triage: Patient called in coughing and states believes he has a: Sinus Infection Patient looking to scheduling appointment to be seen.  Transf. To NT   Reason for Disposition  [1] Sinus congestion (pressure, fullness) AND [2] present > 10 days  Answer Assessment - Initial Assessment Questions 1. LOCATION: Where does it hurt?      Facial pain  2. ONSET: When did the sinus pain start?  (e.g., hours, days)      Started a couple of weeks ago  3. SEVERITY: How bad is the pain?   (Scale 0-10; or none, mild, moderate or severe)     Mild 3/10 pain, Says its more irritating than anything  4. RECURRENT SYMPTOM: Have you ever had sinus problems before? If Yes, ask: When was the last time? and What happened that time?      Yes, has history sinus infections and has had nasal surgeries  5. NASAL CONGESTION: Is the nose blocked? If Yes, ask: Can you open it or must you breathe through your mouth?     Yes, can breath 6. NASAL DISCHARGE: Do you have discharge from your nose? If so ask, What color?     Green mucus  7. FEVER: Do you have a fever? If Yes, ask: What is it, how was it measured, and when did it start?      Felt feverish but didn't take temp  8. OTHER SYMPTOMS: Do you have any other symptoms? (e.g., sore throat, cough, earache, difficulty breathing)     Cough, sore  throat, ears feel clogging  Protocols used: Sinus Pain or Congestion-A-AH

## 2024-10-24 ENCOUNTER — Encounter: Payer: Self-pay | Admitting: Nurse Practitioner

## 2024-10-31 ENCOUNTER — Ambulatory Visit: Admitting: Nurse Practitioner

## 2024-10-31 ENCOUNTER — Other Ambulatory Visit: Payer: Self-pay | Admitting: Podiatry

## 2024-10-31 ENCOUNTER — Encounter: Payer: Self-pay | Admitting: Nurse Practitioner

## 2024-10-31 VITALS — BP 100/66 | HR 62 | Temp 98.3°F | Ht 70.28 in | Wt 203.2 lb

## 2024-10-31 DIAGNOSIS — G4733 Obstructive sleep apnea (adult) (pediatric): Secondary | ICD-10-CM | POA: Diagnosis not present

## 2024-10-31 DIAGNOSIS — Z0000 Encounter for general adult medical examination without abnormal findings: Secondary | ICD-10-CM | POA: Diagnosis not present

## 2024-10-31 DIAGNOSIS — R234 Changes in skin texture: Secondary | ICD-10-CM | POA: Diagnosis not present

## 2024-10-31 DIAGNOSIS — Z23 Encounter for immunization: Secondary | ICD-10-CM | POA: Diagnosis not present

## 2024-10-31 DIAGNOSIS — M79641 Pain in right hand: Secondary | ICD-10-CM | POA: Diagnosis not present

## 2024-10-31 DIAGNOSIS — M79642 Pain in left hand: Secondary | ICD-10-CM

## 2024-10-31 DIAGNOSIS — E7800 Pure hypercholesterolemia, unspecified: Secondary | ICD-10-CM | POA: Diagnosis not present

## 2024-10-31 NOTE — Assessment & Plan Note (Signed)
 Uses CPAP nightly

## 2024-10-31 NOTE — Progress Notes (Signed)
 BP 100/66 (BP Location: Left Arm, Patient Position: Sitting)   Pulse 62   Temp 98.3 F (36.8 C) (Oral)   Ht 5' 10.28 (1.785 m)   Wt 203 lb 3.2 oz (92.2 kg)   SpO2 96%   BMI 28.93 kg/m    Subjective:    Patient ID: Lance Walsh, male    DOB: May 10, 1976, 48 y.o.   MRN: [id number]  HPI: Lance Walsh is a 48 y.o. male presenting on 10/31/2024 for comprehensive medical examination. Current medical complaints include:none  He currently lives with: Interim Problems from his last visit: no  Patient states he is having some having joint pain in his hands.  Over the last year he wakes up and his joints are very sore.  He has stiffness and difficulty making a fist.  He has a sore on the left hand ring finger.  It doesn't heal.  It cracks open and he has been using a cream but it is no longer helping.     Depression Screen done today and results listed below:     10/31/2024    9:03 AM 10/24/2023    9:02 AM 04/13/2023    3:13 PM 10/13/2022    3:02 PM 07/22/2022    2:04 PM  Depression screen PHQ 2/9  Decreased Interest 0 0 0 0 1  Down, Depressed, Hopeless 0 0 0 0 0  PHQ - 2 Score 0 0 0 0 1  Altered sleeping 0 0 1 0 0  Tired, decreased energy 0 0 1 0 2  Change in appetite 0 0 0 0 1  Feeling bad or failure about yourself  0 0 0 0 0  Trouble concentrating 0 0 0 0 0  Moving slowly or fidgety/restless 0 0 0 0 0  Suicidal thoughts 0 0 0 0 0  PHQ-9 Score 0 0  2  0  4   Difficult doing work/chores Not difficult at all  Somewhat difficult Not difficult at all Somewhat difficult     Data saved with a previous flowsheet row definition    The patient does not have a history of falls. I did complete a risk assessment for falls. A plan of care for falls was documented.   Past Medical History:  Past Medical History:  Diagnosis Date   Bipolar 1 disorder (HCC)    Calculus of kidney    Depression    Fatty liver    GERD (gastroesophageal reflux disease)    Headache    migraine and sinus    History of kidney stones    Plantar fasciitis    Sleep apnea    not using CPAP    Surgical History:  Past Surgical History:  Procedure Laterality Date   COLONOSCOPY     COLONOSCOPY WITH PROPOFOL  N/A 09/17/2022   Procedure: COLONOSCOPY WITH PROPOFOL ;  Surgeon: Jinny Carmine, MD;  Location: [facility];  Service: Endoscopy;  Laterality: N/A;   NASAL SEPTOPLASTY W/ TURBINOPLASTY Bilateral 06/30/2021   Procedure: NASAL SEPTOPLASTY WITH TURBINATE REDUCTION, SUBMUCOUS RESECTION;  Surgeon: Blair Deward, MD;  Location: [facility];  Service: ENT;  Laterality: Bilateral;   REFRACTIVE SURGERY Bilateral 01/2022   TURBINATE REDUCTION Left 03/08/2023   Procedure: SUBMUCUSAL MIDDLE TURBINATE REDUCTION;  Surgeon: Blair Deward, MD;  Location: [facility];  Service: ENT;  Laterality: Left;   WISDOM TOOTH EXTRACTION      Medications:  Current Outpatient Medications on File Prior to Visit  Medication Sig   EPINEPHrine  0.3  mg/0.3 mL IJ SOAJ injection Inject 0.3 mg into the muscle as needed for anaphylaxis.   Erenumab-aooe 140 MG/ML SOAJ Inject into the skin every 30 (thirty) days. Takes on the 12th   fluorometholone (FML) 0.1 % ophthalmic suspension    lidocaine  (XYLOCAINE ) 2 % solution Use as directed 15 mLs in the mouth or throat as needed for mouth pain.   meloxicam  (MOBIC ) 15 MG tablet Take 1 tablet (15 mg total) by mouth daily.   Perfluorohexyloctane (MIEBO OP) Apply to eye 2 (two) times daily.   RESTASIS 0.05 % ophthalmic emulsion Place 1 drop into both eyes 4 (four) times daily.   silver  sulfADIAZINE  (SILVADENE ) 1 % cream Apply 1 Application topically daily.   SUMAtriptan  (IMITREX ) 100 MG tablet Take 1 tablet (100 mg total) by mouth daily as needed for migraine or headache. Maximum once a day only. Not to exceed 1 tab in 24 hrs.   tamsulosin  (FLOMAX ) 0.4 MG CAPS capsule Take 1 capsule (0.4 mg total) by mouth daily.   No current facility-administered medications on file  prior to visit.    Allergies:  Allergies  Allergen Reactions   Shellfish Protein-Containing Drug Products Swelling    Throat Closing   Lactose Intolerance (Gi) Diarrhea         Milk (Cow)     Social History:  Social History   Socioeconomic History   Marital status: Married    Spouse name: Not on file   Number of children: Not on file   Years of education: Not on file   Highest education level: Not on file  Occupational History   Not on file  Tobacco Use   Smoking status: Never   Smokeless tobacco: Never  Vaping Use   Vaping status: Never Used  Substance and Sexual Activity   Alcohol use: Not Currently    Alcohol/week: 2.0 standard drinks of alcohol    Types: 2 Shots of liquor per week    Comment: only on the weekends   Drug use: No   Sexual activity: Not on file  Other Topics Concern   Not on file  Social History Narrative   Not on file   Social Drivers of Health   Financial Resource Strain: Not on file  Food Insecurity: Not on file  Transportation Needs: Not on file  Physical Activity: Not on file  Stress: Not on file  Social Connections: Not on file  Intimate Partner Violence: Not on file   Social History   Tobacco Use  Smoking Status Never  Smokeless Tobacco Never   Social History   Substance and Sexual Activity  Alcohol Use Not Currently   Alcohol/week: 2.0 standard drinks of alcohol   Types: 2 Shots of liquor per week   Comment: only on the weekends    Family History:  Family History  Problem Relation Age of Onset   Alcohol abuse Mother    Diabetes Mother    Cancer Mother    Diabetes Sister    Cancer Sister     Past medical history, surgical history, medications, allergies, family history and social history reviewed with patient today and changes made to appropriate areas of the chart.   Review of Systems  Musculoskeletal:        Bilateral hand pain  Skin:        Cracked skin on left hand ring finger   All other ROS negative  except what is listed above and in the HPI.      Objective:  BP 100/66 (BP Location: Left Arm, Patient Position: Sitting)   Pulse 62   Temp 98.3 F (36.8 C) (Oral)   Ht 5' 10.28 (1.785 m)   Wt 203 lb 3.2 oz (92.2 kg)   SpO2 96%   BMI 28.93 kg/m   Wt Readings from Last 3 Encounters:  10/31/24 203 lb 3.2 oz (92.2 kg)  10/04/24 201 lb 3.2 oz (91.3 kg)  07/24/24 191 lb (86.6 kg)    Physical Exam Vitals and nursing note reviewed.  Constitutional:      General: He is not in acute distress.    Appearance: Normal appearance. He is not ill-appearing, toxic-appearing or diaphoretic.  HENT:     Head: Normocephalic.     Right Ear: Tympanic membrane, ear canal and external ear normal.     Left Ear: Tympanic membrane, ear canal and external ear normal.     Nose: Nose normal. No congestion or rhinorrhea.     Mouth/Throat:     Mouth: Mucous membranes are moist.  Eyes:     General:        Right eye: No discharge.        Left eye: No discharge.     Extraocular Movements: Extraocular movements intact.     Conjunctiva/sclera: Conjunctivae normal.     Pupils: Pupils are equal, round, and reactive to light.  Cardiovascular:     Rate and Rhythm: Normal rate and regular rhythm.     Heart sounds: No murmur heard. Pulmonary:     Effort: Pulmonary effort is normal. No respiratory distress.     Breath sounds: Normal breath sounds. No wheezing, rhonchi or rales.  Abdominal:     General: Abdomen is flat. Bowel sounds are normal. There is no distension.     Palpations: Abdomen is soft.     Tenderness: There is no abdominal tenderness. There is no guarding.  Musculoskeletal:     Cervical back: Normal range of motion and neck supple.  Skin:    General: Skin is warm and dry.     Capillary Refill: Capillary refill takes less than 2 seconds.     Comments: Open crack on left hand ring finger  Neurological:     General: No focal deficit present.     Mental Status: He is alert and oriented to  person, place, and time.     Cranial Nerves: No cranial nerve deficit.     Motor: No weakness.     Deep Tendon Reflexes: Reflexes normal.  Psychiatric:        Mood and Affect: Mood normal.        Behavior: Behavior normal.        Thought Content: Thought content normal.        Judgment: Judgment normal.     Results for orders placed or performed during the hospital encounter of 11/01/23  Urine Culture   Collection Time: 11/01/23  1:48 PM   Specimen: Urine, Clean Catch  Result Value Ref Range   Specimen Description      URINE, CLEAN CATCH Performed at [facility], [address] [place], [place] [address]    Special Requests      Normal Performed at [facility], [address], [place], [place] [address]    Culture      NO GROWTH Performed at [facility], [address] [place], [place] [address]    Report Status 11/02/2023 FINAL   Urinalysis, w/ Reflex to Culture (Infection Suspected) -  Urine, Clean Catch   Collection Time: 11/01/23  1:48 PM  Result Value Ref Range   Specimen Source URINE, CLEAN CATCH    Color, Urine YELLOW YELLOW   APPearance HAZY (A) CLEAR   Specific Gravity, Urine 1.020 1.005 - 1.030   pH 7.0 5.0 - 8.0   Glucose, UA NEGATIVE NEGATIVE mg/dL   Hgb urine dipstick NEGATIVE NEGATIVE   Bilirubin Urine NEGATIVE NEGATIVE   Ketones, ur NEGATIVE NEGATIVE mg/dL   Protein, ur NEGATIVE NEGATIVE mg/dL   Nitrite NEGATIVE NEGATIVE   Leukocytes,Ua NEGATIVE NEGATIVE   Squamous Epithelial / HPF NONE SEEN 0 - 5 /HPF   WBC, UA 0-5 0 - 5 WBC/hpf   RBC / HPF 0-5 0 - 5 RBC/hpf   Bacteria, UA RARE (A) NONE SEEN   Amorphous Crystal PRESENT       Assessment & Plan:   Problem List Items Addressed This Visit       Respiratory   Sleep apnea   Uses CPAP nightly.          Other   Elevated low density lipoprotein (LDL) cholesterol level   Chronic.  Controlled.  Continue with current medication regimen.  Labs ordered today.   Return to clinic in 6 months for reevaluation.  Call sooner if concerns arise.        Relevant Orders   Lipid panel   Other Visit Diagnoses       Annual physical exam    -  Primary   Health maintenance reviewed during visit today.  Labs ordered.   Vaccines reviewed.  Colonoscopy up to date.   Relevant Orders   TSH   Lipid panel   CBC with Differential/Platelet   Comprehensive metabolic panel with GFR     Cracked skin       Referral placed for patient to see Dermatology.   Relevant Orders   Ambulatory referral to Dermatology     Pain in both hands       Referral placed for patient to see Orthopedics for further evaluation and management.   Relevant Orders   Ambulatory referral to Orthopedic Surgery     Flu vaccine need       Relevant Orders   Flu vaccine trivalent PF, 6mos and older(Flulaval,Afluria,Fluarix,Fluzone) (Completed)         Discussed aspirin prophylaxis for myocardial infarction prevention and decision was it was not indicated  LABORATORY TESTING:  Health maintenance labs ordered today as discussed above.     IMMUNIZATIONS:   - Tdap: Tetanus vaccination status reviewed: last tetanus booster within 10 years. - Influenza: Administered today - Pneumovax: Not applicable - Prevnar: Not applicable - COVID: Not applicable - HPV: Not applicable - Shingrix vaccine: Not applicable  SCREENING: - Colonoscopy: Up to date  Discussed with patient purpose of the colonoscopy is to detect colon cancer at curable precancerous or early stages   - AAA Screening: Not applicable  -Hearing Test: Not applicable  -Spirometry: Not applicable   PATIENT COUNSELING:    Sexuality: Discussed sexually transmitted diseases, partner selection, use of condoms, avoidance of unintended pregnancy  and contraceptive alternatives.   Advised to avoid cigarette smoking.  I discussed with the patient that most people either abstain from alcohol or drink within safe limits (<=14/week and  <=4 drinks/occasion for males, <=7/weeks and <= 3 drinks/occasion for females) and that the risk for alcohol disorders and other health effects rises proportionally with the number of drinks per week and how often a drinker exceeds  daily limits.  Discussed cessation/primary prevention of drug use and availability of treatment for abuse.   Diet: Encouraged to adjust caloric intake to maintain  or achieve ideal body weight, to reduce intake of dietary saturated fat and total fat, to limit sodium intake by avoiding high sodium foods and not adding table salt, and to maintain adequate dietary potassium and calcium preferably from fresh fruits, vegetables, and low-fat dairy products.    stressed the importance of regular exercise  Injury prevention: Discussed safety belts, safety helmets, smoke detector, smoking near bedding or upholstery.   Dental health: Discussed importance of regular tooth brushing, flossing, and dental visits.   Follow up plan: NEXT PREVENTATIVE PHYSICAL DUE IN 1 YEAR. No follow-ups on file.

## 2024-10-31 NOTE — Assessment & Plan Note (Signed)
 Chronic.  Controlled.  Continue with current medication regimen.  Labs ordered today.  Return to clinic in 6 months for reevaluation.  Call sooner if concerns arise.  ? ?

## 2024-11-01 ENCOUNTER — Ambulatory Visit: Payer: Self-pay | Admitting: Nurse Practitioner

## 2024-11-01 LAB — COMPREHENSIVE METABOLIC PANEL WITH GFR
ALT: 46 IU/L — ABNORMAL HIGH (ref 0–44)
AST: 19 IU/L (ref 0–40)
Albumin: 4.5 g/dL (ref 4.1–5.1)
Alkaline Phosphatase: 52 IU/L (ref 47–123)
BUN/Creatinine Ratio: 19 (ref 9–20)
BUN: 16 mg/dL (ref 6–24)
Bilirubin Total: 0.7 mg/dL (ref 0.0–1.2)
CO2: 25 mmol/L (ref 20–29)
Calcium: 9.4 mg/dL (ref 8.7–10.2)
Chloride: 101 mmol/L (ref 96–106)
Creatinine, Ser: 0.85 mg/dL (ref 0.76–1.27)
Globulin, Total: 2.2 g/dL (ref 1.5–4.5)
Glucose: 89 mg/dL (ref 70–99)
Potassium: 4.8 mmol/L (ref 3.5–5.2)
Sodium: 142 mmol/L (ref 134–144)
Total Protein: 6.7 g/dL (ref 6.0–8.5)
eGFR: 107 mL/min/1.73 (ref >59)

## 2024-11-01 LAB — CBC WITH DIFFERENTIAL/PLATELET
Basophils Absolute: 0 x10E3/uL (ref 0.0–0.2)
Basos: 1 %
EOS (ABSOLUTE): 0.2 x10E3/uL (ref 0.0–0.4)
Eos: 3 %
Hematocrit: 49.2 % (ref 37.5–51.0)
Hemoglobin: 16.4 g/dL (ref 13.0–17.7)
Immature Grans (Abs): 0 x10E3/uL (ref 0.0–0.1)
Immature Granulocytes: 0 %
Lymphocytes Absolute: 2 x10E3/uL (ref 0.7–3.1)
Lymphs: 32 %
MCH: 30.4 pg (ref 26.6–33.0)
MCHC: 33.3 g/dL (ref 31.5–35.7)
MCV: 91 fL (ref 79–97)
Monocytes Absolute: 0.6 x10E3/uL (ref 0.1–0.9)
Monocytes: 9 %
Neutrophils Absolute: 3.5 x10E3/uL (ref 1.4–7.0)
Neutrophils: 55 %
Platelets: 232 x10E3/uL (ref 150–450)
RBC: 5.4 x10E6/uL (ref 4.14–5.80)
RDW: 12.6 % (ref 11.6–15.4)
WBC: 6.3 x10E3/uL (ref 3.4–10.8)

## 2024-11-01 LAB — LIPID PANEL
Chol/HDL Ratio: 4.7 ratio (ref 0.0–5.0)
Cholesterol, Total: 261 mg/dL — ABNORMAL HIGH (ref 100–199)
HDL: 56 mg/dL (ref >39)
LDL Chol Calc (NIH): 190 mg/dL — ABNORMAL HIGH (ref 0–99)
Triglycerides: 86 mg/dL (ref 0–149)
VLDL Cholesterol Cal: 15 mg/dL (ref 5–40)

## 2024-11-01 LAB — TSH: TSH: 1.26 u[IU]/mL (ref 0.450–4.500)

## 2024-11-15 ENCOUNTER — Ambulatory Visit

## 2024-11-15 DIAGNOSIS — L309 Dermatitis, unspecified: Secondary | ICD-10-CM

## 2024-11-15 DIAGNOSIS — L308 Other specified dermatitis: Secondary | ICD-10-CM | POA: Diagnosis not present

## 2024-11-15 DIAGNOSIS — L980 Pyogenic granuloma: Secondary | ICD-10-CM | POA: Diagnosis not present

## 2024-11-15 MED ORDER — TIMOLOL HEMIHYDRATE 0.5 % OP SOLN: OPHTHALMIC | 3 refills | Status: AC

## 2024-11-15 MED ORDER — CLOBETASOL PROPIONATE 0.05 % EX OINT: TOPICAL_OINTMENT | CUTANEOUS | 5 refills | Status: AC

## 2024-11-15 MED ORDER — TIMOLOL MALEATE (ONCE-DAILY) 0.5 % OP SOLN: OPHTHALMIC | 2 refills | Status: AC

## 2024-11-15 NOTE — Addendum Note (Signed)
 Addended by: RAYMUND LAURAINE BROCKS on: 11/15/2024 04:10 PM   Modules accepted: Orders

## 2024-11-15 NOTE — Patient Instructions (Signed)

## 2024-11-15 NOTE — Progress Notes (Signed)
°  °  Subjective   Lance Walsh is a 48 y.o. male who presents for the following: Rash. Patient is new patient  Today patient reports: Patient reports cracked skin at left ring finger spreading to hands currently using a topical steroid he was prescribed previously was using neosporin. Patient reports it splits open and does itch.   This patient is accompanied in the office by his spouse.   Review of Systems:    No other skin or systemic complaints except as noted in HPI or Assessment and Plan.  The following portions of the chart were reviewed this encounter and updated as appropriate: medications, allergies, medical history  Relevant Medical History:  n/a   Objective  (SKPE) Well appearing patient in no apparent distress; mood and affect are within normal limits. Examination was performed of the: Focused Exam of: Bilateral hands   Examination notable for: - Hands with xerosis and lichenified plaques, fissuring of webspaces and scale.  -Right medial thumb with pink friable papule. Examination limited by: Undergarments, Shoes or socks , and Clothing     Assessment & Plan  (SKAP)   Hand dermatitis - moderate Chronic and persistent condition with duration or expected duration over one year. Condition is symptomatic and bothersome to patient. Patient is flaring and not currently at treatment goal.  - Diagnosis, treatment options, prognosis, risk/ benefit, and side effects of treatment were discussed with the patient.  - Start of clobetasol  0.05% ointment BID to raised itchy areas until areas are smooth, discussed risks/benefits of meds. At night recommend applying and wearing gloves at night.  - Moisturization with with thick ointment like vaseline, use cotton gloves to sleep in at night - Wash hands in lukewarm water  - Use cotton gloves under rubber gloves when using cleaning products - Recommend vaseline for hands and clobetasol  only - Take notes of things pt comes in contact with  to see if things trigger worsening of itching - Instructions given  Pyogenic granuloma right thumb - Informed patient that this is a benign proliferation of blood vessels. - Discussed diagnosis, typical course, and treatment options for this condition - Informed the patient that there is a small chance that the lesion will return, despite treatment.  - Start timolol  0.5% solution, apply 1 drop daily to lesion right thumb  - Patient advised that if not responding to clobetasol  & timolol , consider biopsy at follow-up.   Was sun protection counseling provided?: No   Level of service outlined above   Patient instructions (SKPI)   Procedures, orders, diagnosis for this visit:    There are no diagnoses linked to this encounter.  Return to clinic: Return for 6-8 weeks hand dermatitis , w/ Dr. Raymund.  I, Jacquelynn V. Wilfred, CMA, am acting as scribe for Lauraine JAYSON Raymund, MD.  Documentation: I have reviewed the above documentation for accuracy and completeness, and I agree with the above.  Lauraine JAYSON Raymund, MD

## 2024-11-19 ENCOUNTER — Ambulatory Visit

## 2024-11-19 ENCOUNTER — Ambulatory Visit (INDEPENDENT_AMBULATORY_CARE_PROVIDER_SITE_OTHER)

## 2024-11-19 DIAGNOSIS — M79642 Pain in left hand: Secondary | ICD-10-CM

## 2024-11-19 DIAGNOSIS — L980 Pyogenic granuloma: Secondary | ICD-10-CM | POA: Diagnosis not present

## 2024-11-19 DIAGNOSIS — M79641 Pain in right hand: Secondary | ICD-10-CM

## 2024-11-19 NOTE — Progress Notes (Signed)
 Orthopaedic Surgery New Patient Visit   History of Present Illness: The patient is a 48 y.o. right-hand-dominant male seen in clinic for bilateral hand pain.  Patient reports hand pain has been ongoing for at least 7 or 8 years.  He denies any inciting injury or trauma to the hands.  He describes the pain as bruised joints and stiffness diffusely.  Symptoms are worse in his right hand compared to the left.  He notes the PIP joints in the right hand hurt the most, followed by the MCP joints.  Denies pain in the DIP joints.  He also reports difficulty with shaking hands and has noticed dropping objects due to stiffness in the hands.  He believes that he had some blood work done years ago for rheumatoid arthritis but cannot remember the details.  Patient has been taking meloxicam  chronically and reports it does help with his symptoms.  He also will ice his hand which also provides temporary relief.  He has also tried Biofreeze and Voltaren gel without relief.  Denies associated numbness or instability in the hands.  No other complaints today.  Patient was recently seen by dermatologist where he was diagnosed with dermatitis of his hands.  He was also diagnosed with pyogenic granuloma of the right thumb.  Patient states that he has had issues with his thumb bleeding frequently due to this lesion and he has concern that it will get infected.  He describes a traumatic injury to the right thumb years ago and noticed the lesion as a result of the trauma.  Patient was prescribed clobetasol  for dermatitis and timolol  for the right thumb lesion.  He is requesting further evaluation for his right thumb lesion.  Patient occupation: [employer] work     Past Administrator, Social and Family History: Past Medical History:  Diagnosis Date   Bipolar 1 disorder (HCC)    Calculus of kidney    Depression    Fatty liver    GERD (gastroesophageal reflux disease)    Headache    migraine and sinus   History of kidney  stones    Plantar fasciitis    Sleep apnea    not using CPAP   Past Surgical History:  Procedure Laterality Date   COLONOSCOPY     COLONOSCOPY WITH PROPOFOL  N/A 09/17/2022   Procedure: COLONOSCOPY WITH PROPOFOL ;  Surgeon: Jinny Carmine, MD;  Location: [facility];  Service: Endoscopy;  Laterality: N/A;   NASAL SEPTOPLASTY W/ TURBINOPLASTY Bilateral 06/30/2021   Procedure: NASAL SEPTOPLASTY WITH TURBINATE REDUCTION, SUBMUCOUS RESECTION;  Surgeon: Blair Mt, MD;  Location: [facility];  Service: ENT;  Laterality: Bilateral;   REFRACTIVE SURGERY Bilateral 01/2022   TURBINATE REDUCTION Left 03/08/2023   Procedure: SUBMUCUSAL MIDDLE TURBINATE REDUCTION;  Surgeon: Blair Mt, MD;  Location: [facility];  Service: ENT;  Laterality: Left;   WISDOM TOOTH EXTRACTION     Allergies[1] Medications Ordered Prior to Encounter[2] Social History[3]    I have reviewed past medical, surgical, social and family history, medications and allergies as documented in the EMR.  Review of Systems - A ROS was performed including pertinent positives and negatives as documented in the HPI.     Physical Exam:  General/Constitutional: NAD Vascular: No edema, swelling or tenderness, except as noted in detailed exam Integumentary: No impressive skin lesions present, except as noted in detailed exam Neuro/Psych: Normal mood and affect, oriented to person, place and time Musculoskeletal: Normal, except as noted in detailed exam and in HPI  Focused Orthopaedic Examination:  Bilateral hand/Digit focused exam:  On gross examination of the bilateral upper extremity the skin is intact.  Fissuring and xerosis noted about the dorsum of the right hand.  Pink papule noted adjacent the base of the right thumb nail plate.  Scar formation noted circumferentially around the distal aspect of the right thumb. No pitting or nail plate deformities noted about bilateral hands.  No active bleeding  or drainage present.  No obvious gross deformities noted about the bilateral hands.  Fingers warm and well perfused with 2+ radial pulse.  Sensation intact to the Median, Ulnar and Radial nerve distribution of the hand.    AIN/PIN/Ulnar nerve motor function intact.  Negative tinels at the cubital tunnel, negative tinels at the carpal tunnel.  Mildly positive Durkan's compression bilateral wrist.  Full flexion and extension of the fingers without active triggering.  Subjective stiffness noted diffusely in MCP and PIP joints when attempting to make composite fist bilaterally (right > left).  Tenderness diffusely about all PIP joints of the right hand (middle>index>ring) without instability.  Similar but lesser pain intensity on the left hand.  Negative for pain about the A1 pulleys.    APB strength 5/5 with no signs of atrophy.    Negative pain about the basilar thumb joint.          XR bilateral hand imaging: X-rays of the bilateral hands including PA, oblique, lateral were obtained in office today and reviewed with patient.  Per my interpretation, there are no acute fractures or dislocations.  MCP, PIP and DIP joints well-preserved without any obvious erosive bony changes or degenerative joint changes.    Assessment:  Diffuse bilateral hand joint pain and stiffness (MCP and PIP joints) - possible inflammatory or auto-immune etiology Right thumb lesion- suspect possible pyogenic granuloma  Plan:  Patient was seen and examined in office today. We reviewed patient's history, examination, and imaging in detail. Based on information available for this encounter, patient with multiple hand complaints today.  In regards to his bilateral hand stiffness and diffuse joint pain, there does not appear to be an identifiable orthopedic cause for his symptoms.  He does have diffuse pain in bilateral hands within the MCP and PIP joints.  No obvious radiographic findings at this time.  We discussed  possible etiologies including autoimmune or inflammatory.  At this time, we believe he would benefit from referral to rheumatology for further workup and evaluation.  In regards to his right thumb lesion, patient states the lesion continues to bleed and he would like to have it addressed as he uses his hands a lot and worries about developing an infection.  The appearance and characteristics appear like a pyogenic granuloma.  He is requesting a referral to hand specialist for further recommendations.  Referral has been placed to hand specialist for evaluation and management.  Advised patient to continue follow-up with dermatology as well for ongoing management as previously discussed with his provider.   Patient education material was provided.  All questions, concerns and comments were addressed to the best of my ability.  Follow-up: PRN  I will be transitioning out of my role within the near future. In order to provide appropriate continuity of care, we offered the patient options for follow up regarding their orthopedic concerns.   Referral has been placed accordingly. Patient may reach out to our office if there are any difficulties in scheduling follow up care. The patient understands who to contact for future orthopedic concerns  and has contact information for the receiving practice.    Arlyss GEANNIE Schneider, DO Orthopedic Surgery & Sports Medicine [place]   This document was dictated using Dragon voice recognition software. A reasonable attempt at proof reading has been made to minimize errors.      [1]  Allergies Allergen Reactions   Shellfish Protein-Containing Drug Products Swelling    Throat Closing   Lactose Intolerance (Gi) Diarrhea         Milk (Cow)   [2]  Current Outpatient Medications on File Prior to Visit  Medication Sig Dispense Refill   clobetasol  ointment (TEMOVATE ) 0.05 % Apply 1 gram topically to affected area of skin twice daily. Stop once resolved and  restart as needed for flares. Avoid use on face, armpits, groin unless otherwise indicated. 60 g 5   EPINEPHrine  0.3 mg/0.3 mL IJ SOAJ injection Inject 0.3 mg into the muscle as needed for anaphylaxis. 1 each 1   Erenumab-aooe 140 MG/ML SOAJ Inject into the skin every 30 (thirty) days. Takes on the 12th     fluorometholone (FML) 0.1 % ophthalmic suspension      lidocaine  (XYLOCAINE ) 2 % solution Use as directed 15 mLs in the mouth or throat as needed for mouth pain. 100 mL 0   meloxicam  (MOBIC ) 15 MG tablet Take 1 tablet (15 mg total) by mouth daily. 60 tablet 0   Perfluorohexyloctane (MIEBO OP) Apply to eye 2 (two) times daily.     RESTASIS 0.05 % ophthalmic emulsion Place 1 drop into both eyes 4 (four) times daily.     silver  sulfADIAZINE  (SILVADENE ) 1 % cream Apply 1 Application topically daily. 30 g 1   SUMAtriptan  (IMITREX ) 100 MG tablet Take 1 tablet (100 mg total) by mouth daily as needed for migraine or headache. Maximum once a day only. Not to exceed 1 tab in 24 hrs. 30 tablet 0   tamsulosin  (FLOMAX ) 0.4 MG CAPS capsule Take 1 capsule (0.4 mg total) by mouth daily. 30 capsule 11   timolol  (BETIMOL ) 0.5 % ophthalmic solution Apply 1 drop daily to lesion right thumb 10 mL 3   Timolol  Maleate, Once-Daily, 0.5 % SOLN Apply once daily to spot on hand 5 mL 2   No current facility-administered medications on file prior to visit.  [3]  Social History Tobacco Use   Smoking status: Never   Smokeless tobacco: Never  Vaping Use   Vaping status: Never Used  Substance Use Topics   Alcohol use: Not Currently    Alcohol/week: 2.0 standard drinks of alcohol    Types: 2 Shots of liquor per week    Comment: only on the weekends   Drug use: No

## 2024-11-19 NOTE — Patient Instructions (Signed)

## 2024-11-26 NOTE — Progress Notes (Unsigned)
 "  Office Visit Note  Patient: Lance Walsh             Date of Birth: 02-12-1976           MRN: [id number]             PCP: Melvin Pao, NP Referring: Gust Molly, DO Visit Date: 12/10/2024 Occupation: Data Unavailable  Subjective:  No chief complaint on file.   History of Present Illness: Lance Walsh is a 48 y.o. male ***     Activities of Daily Living:  Patient reports morning stiffness for *** {minute/hour:19697}.   Patient {ACTIONS;DENIES/REPORTS:21021675::Denies} nocturnal pain.  Difficulty dressing/grooming: {ACTIONS;DENIES/REPORTS:21021675::Denies} Difficulty climbing stairs: {ACTIONS;DENIES/REPORTS:21021675::Denies} Difficulty getting out of chair: {ACTIONS;DENIES/REPORTS:21021675::Denies} Difficulty using hands for taps, buttons, cutlery, and/or writing: {ACTIONS;DENIES/REPORTS:21021675::Denies}  No Rheumatology ROS completed.   PMFS History:  Patient Active Problem List   Diagnosis Date Noted   Acute sinusitis 11/09/2023   Urinary frequency 11/01/2023   Sleep apnea 07/22/2022   Anxiety 05/25/2022   Eczema 05/25/2022   Chronic back pain 12/09/2020   Gastroesophageal reflux disease 12/09/2020   Long-term current use of benzodiazepine 12/09/2020   Elevated low density lipoprotein (LDL) cholesterol level 12/06/2020   Allergic rhinitis 02/13/2019   Insomnia 11/13/2018   Migraine headache 12/29/2016   Depression 11/04/2015    Past Medical History:  Diagnosis Date   Bipolar 1 disorder (HCC)    Calculus of kidney    Depression    Fatty liver    GERD (gastroesophageal reflux disease)    Headache    migraine and sinus   History of kidney stones    Plantar fasciitis    Sleep apnea    not using CPAP    Family History  Problem Relation Age of Onset   Alcohol abuse Mother    Diabetes Mother    Cancer Mother    Diabetes Sister    Cancer Sister    Past Surgical History:  Procedure Laterality Date   COLONOSCOPY     COLONOSCOPY WITH  PROPOFOL  N/A 09/17/2022   Procedure: COLONOSCOPY WITH PROPOFOL ;  Surgeon: Jinny Carmine, MD;  Location: [facility];  Service: Endoscopy;  Laterality: N/A;   NASAL SEPTOPLASTY W/ TURBINOPLASTY Bilateral 06/30/2021   Procedure: NASAL SEPTOPLASTY WITH TURBINATE REDUCTION, SUBMUCOUS RESECTION;  Surgeon: Blair Deward, MD;  Location: [facility];  Service: ENT;  Laterality: Bilateral;   REFRACTIVE SURGERY Bilateral 01/2022   TURBINATE REDUCTION Left 03/08/2023   Procedure: SUBMUCUSAL MIDDLE TURBINATE REDUCTION;  Surgeon: Blair Deward, MD;  Location: [facility];  Service: ENT;  Laterality: Left;   WISDOM TOOTH EXTRACTION     Social History[1] Social History   Social History Narrative   Not on file     Immunization History  Administered Date(s) Administered   Influenza, Seasonal, Injecte, Preservative Fre 10/24/2023, 10/31/2024   Influenza,inj,Quad PF,6+ Mos 11/04/2015, 12/29/2016, 07/25/2018, 01/14/2020, 10/13/2022   Influenza-Unspecified 10/09/2020   Td 04/08/2008   Tdap 07/25/2018, 04/06/2019, 10/08/2021     Objective: Vital Signs: There were no vitals taken for this visit.   Physical Exam   Musculoskeletal Exam: ***  CDAI Exam: CDAI Score: -- Patient Global: --; Provider Global: -- Swollen: --; Tender: -- Joint Exam 12/10/2024   No joint exam has been documented for this visit   There is currently no information documented on the homunculus. Go to the Rheumatology activity and complete the homunculus joint exam.  Investigation: No additional findings.  Imaging: No results found.  Recent Labs: Lab Results  Component Value Date   WBC 6.3 10/31/2024   HGB 16.4 10/31/2024   PLT 232 10/31/2024   NA 142 10/31/2024   K 4.8 10/31/2024   CL 101 10/31/2024   CO2 25 10/31/2024   GLUCOSE 89 10/31/2024   BUN 16 10/31/2024   CREATININE 0.85 10/31/2024   BILITOT 0.7 10/31/2024   ALKPHOS 52 10/31/2024   AST 19 10/31/2024   ALT 46 (H) 10/31/2024    PROT 6.7 10/31/2024   ALBUMIN 4.5 10/31/2024   CALCIUM 9.4 10/31/2024   GFRAA 96 12/09/2020    Speciality Comments: No specialty comments available.  Procedures:  No procedures performed Allergies: Shellfish protein-containing drug products, Lactose intolerance (gi), and Milk (cow)   Assessment / Plan:     Visit Diagnoses: No diagnosis found.  Orders: No orders of the defined types were placed in this encounter.  No orders of the defined types were placed in this encounter.   Face-to-face time spent with patient was *** minutes. Greater than 50% of time was spent in counseling and coordination of care.  Follow-Up Instructions: No follow-ups on file.   Hadassah Castleman, CMA  Note - This record has been created using [employer].  Chart creation errors have been sought, but may not always  have been located. Such creation errors do not reflect on  the standard of medical care.     [1]  Social History Tobacco Use   Smoking status: Never   Smokeless tobacco: Never  Vaping Use   Vaping status: Never Used  Substance Use Topics   Alcohol use: Not Currently    Alcohol/week: 2.0 standard drinks of alcohol    Types: 2 Shots of liquor per week    Comment: only on the weekends   Drug use: No   "

## 2024-12-10 ENCOUNTER — Ambulatory Visit

## 2025-01-03 ENCOUNTER — Ambulatory Visit: Payer: Self-pay

## 2025-01-03 ENCOUNTER — Ambulatory Visit

## 2025-01-03 NOTE — Telephone Encounter (Signed)
 FYI Only or Action Required?: FYI only for provider: appointment scheduled on 2/6.  Patient was last seen in primary care on 10/31/2024 by Melvin Pao, NP.  Called Nurse Triage reporting Cough.  Symptoms began a week ago.  Symptoms are: gradually worsening.  Triage Disposition: See HCP Within 4 Hours (Or PCP Triage)  Patient/caregiver understands and will follow disposition?: No, wishes to speak with PCP  Reason for Triage: The patient reports symptoms consistent with a sinus infection, including coughing up green phlegm, fatigue, and wheezing  Reason for Disposition  Wheezing is present  Answer Assessment - Initial Assessment Questions 1. ONSET: When did the cough begin?      About a week 2. SEVERITY: How bad is the cough today?      Moderate-severe 3. SPUTUM: Describe the color of your sputum (e.g., none, dry cough; clear, white, yellow, green)     green 4. HEMOPTYSIS: Are you coughing up any blood? If Yes, ask: How much? (e.g., flecks, streaks, tablespoons, etc.)     denies 5. DIFFICULTY BREATHING: Are you having difficulty breathing? If Yes, ask: How bad is it? (e.g., mild, moderate, severe)      denies 6. FEVER: Do you have a fever? If Yes, ask: What is your temperature, how was it measured, and when did it start?     Unsure, denies at this time 10. OTHER SYMPTOMS: Do you have any other symptoms? (e.g., runny nose, wheezing, chest pain)       Wheezing, sore throat  Pt requesting to only see PCP.  Protocols used: Cough - Acute Productive-A-AH

## 2025-01-04 ENCOUNTER — Ambulatory Visit: Payer: Self-pay | Admitting: Nurse Practitioner

## 2025-01-04 ENCOUNTER — Ambulatory Visit: Admitting: Nurse Practitioner

## 2025-01-04 ENCOUNTER — Encounter: Payer: Self-pay | Admitting: Nurse Practitioner

## 2025-01-04 VITALS — BP 120/87 | HR 86 | Temp 98.7°F | Ht 70.28 in | Wt 204.8 lb

## 2025-01-04 DIAGNOSIS — R051 Acute cough: Secondary | ICD-10-CM

## 2025-01-04 DIAGNOSIS — J0100 Acute maxillary sinusitis, unspecified: Secondary | ICD-10-CM

## 2025-01-04 DIAGNOSIS — J029 Acute pharyngitis, unspecified: Secondary | ICD-10-CM

## 2025-01-04 LAB — VERITOR FLU A/B WAIVED
Influenza A: NEGATIVE
Influenza B: NEGATIVE

## 2025-01-04 LAB — POC COVID19 BINAXNOW: SARS Coronavirus 2 Ag: NEGATIVE

## 2025-01-04 MED ORDER — METHYLPREDNISOLONE 4 MG PO TBPK: ORAL_TABLET | ORAL | 0 refills | Status: AC

## 2025-01-04 MED ORDER — AMOXICILLIN 500 MG PO CAPS: 500.0000 mg | ORAL_CAPSULE | Freq: Two times a day (BID) | ORAL | 0 refills | Status: AC

## 2025-01-04 MED ORDER — HYDROCOD POLI-CHLORPHE POLI ER 10-8 MG/5ML PO SUER: 5.0000 mL | Freq: Every evening | ORAL | 0 refills | Status: AC | PRN

## 2025-01-04 NOTE — Assessment & Plan Note (Signed)
 Flu and COVID negative in office. Will treat with Amoxicillin  and medrol  dose pak.  Complete course of medication.  Follow up if not improved.  Continue with OTC symptom management.

## 2025-01-04 NOTE — Progress Notes (Signed)
 "  BP 120/87 (BP Location: Left Arm, Patient Position: Sitting, Cuff Size: Large)   Pulse 86   Temp 98.7 F (37.1 C) (Oral)   Ht 5' 10.28 (1.785 m)   Wt 204 lb 12.8 oz (92.9 kg)   SpO2 96%   BMI 29.16 kg/m    Subjective:    Patient ID: Lance Walsh, male    DOB: 11-12-1976, 49 y.o.   MRN: [id number]  HPI: Lance Walsh is a 49 y.o. male  Chief Complaint  Patient presents with   Cough    Patient stated the cough has lasted for weeks, and he is coughing and blowing out green mucous, he also was breaking out in cold sweats, fatigued, and body aches.    UPPER RESPIRATORY TRACT INFECTION Worst symptom: symptoms started a couple of weeks ago Fever: had one for a couple of days but its gone now Cough: yes Shortness of breath: yes Wheezing: yes Chest pain: yes, with cough Chest tightness: yes Chest congestion: yes Nasal congestion: yes Runny nose: yes Post nasal drip: yes Sneezing: yes Sore throat: yes Swollen glands: no Sinus pressure: yes Headache: yes Face pain: no Toothache: no Ear pain: no bilateral Ear pressure: no bilateral Eyes red/itching:no Eye drainage/crusting: yes  Vomiting: no Rash: no Fatigue: yes Sick contacts: yes Strep contacts: no  Context: worse Recurrent sinusitis: no Relief with OTC cold/cough medications: maybe Treatments attempted: mucinex and pseudofed  Relevant past medical, surgical, family and social history reviewed and updated as indicated. Interim medical history since our last visit reviewed. Allergies and medications reviewed and updated.  Review of Systems  Constitutional:  Positive for fatigue and fever.  HENT:  Positive for congestion, postnasal drip, rhinorrhea, sinus pressure, sinus pain, sneezing and sore throat. Negative for ear pain.   Respiratory:  Positive for cough, shortness of breath and wheezing. Negative for chest tightness.   Gastrointestinal:  Negative for vomiting.  Skin:  Negative for rash.  Neurological:   Positive for headaches.    Per HPI unless specifically indicated above     Objective:    BP 120/87 (BP Location: Left Arm, Patient Position: Sitting, Cuff Size: Large)   Pulse 86   Temp 98.7 F (37.1 C) (Oral)   Ht 5' 10.28 (1.785 m)   Wt 204 lb 12.8 oz (92.9 kg)   SpO2 96%   BMI 29.16 kg/m   Wt Readings from Last 3 Encounters:  01/04/25 204 lb 12.8 oz (92.9 kg)  10/31/24 203 lb 3.2 oz (92.2 kg)  10/04/24 201 lb 3.2 oz (91.3 kg)    Physical Exam Vitals and nursing note reviewed.  Constitutional:      General: He is not in acute distress.    Appearance: Normal appearance. He is not ill-appearing, toxic-appearing or diaphoretic.  HENT:     Head: Normocephalic.     Right Ear: External ear normal. A middle ear effusion is present. Tympanic membrane is erythematous.     Left Ear: External ear normal. A middle ear effusion is present. Tympanic membrane is not erythematous.     Nose: Congestion and rhinorrhea present.     Mouth/Throat:     Mouth: Mucous membranes are moist.     Pharynx: Posterior oropharyngeal erythema present. No oropharyngeal exudate.  Eyes:     General:        Right eye: No discharge.        Left eye: No discharge.     Extraocular Movements: Extraocular movements intact.  Conjunctiva/sclera: Conjunctivae normal.     Pupils: Pupils are equal, round, and reactive to light.  Cardiovascular:     Rate and Rhythm: Normal rate and regular rhythm.     Heart sounds: No murmur heard. Pulmonary:     Effort: Pulmonary effort is normal. No respiratory distress.     Breath sounds: Normal breath sounds. No wheezing, rhonchi or rales.  Abdominal:     General: Abdomen is flat. Bowel sounds are normal.  Musculoskeletal:     Cervical back: Normal range of motion and neck supple.  Skin:    General: Skin is warm and dry.     Capillary Refill: Capillary refill takes less than 2 seconds.  Neurological:     General: No focal deficit present.     Mental Status: He is  alert and oriented to person, place, and time.  Psychiatric:        Mood and Affect: Mood normal.        Behavior: Behavior normal.        Thought Content: Thought content normal.        Judgment: Judgment normal.     Results for orders placed or performed in visit on 10/31/24  TSH   Collection Time: 10/31/24  9:19 AM  Result Value Ref Range   TSH 1.260 0.450 - 4.500 uIU/mL  Lipid panel   Collection Time: 10/31/24  9:19 AM  Result Value Ref Range   Cholesterol, Total 261 (H) 100 - 199 mg/dL   Triglycerides 86 0 - 149 mg/dL   HDL 56 >60 mg/dL   VLDL Cholesterol Cal 15 5 - 40 mg/dL   LDL Chol Calc (NIH) 809 (H) 0 - 99 mg/dL   Chol/HDL Ratio 4.7 0.0 - 5.0 ratio  CBC with Differential/Platelet   Collection Time: 10/31/24  9:19 AM  Result Value Ref Range   WBC 6.3 3.4 - 10.8 x10E3/uL   RBC 5.40 4.14 - 5.80 x10E6/uL   Hemoglobin 16.4 13.0 - 17.7 g/dL   Hematocrit 50.7 62.4 - 51.0 %   MCV 91 79 - 97 fL   MCH 30.4 26.6 - 33.0 pg   MCHC 33.3 31.5 - 35.7 g/dL   RDW 87.3 88.3 - 84.5 %   Platelets 232 150 - 450 x10E3/uL   Neutrophils 55 Not Estab. %   Lymphs 32 Not Estab. %   Monocytes 9 Not Estab. %   Eos 3 Not Estab. %   Basos 1 Not Estab. %   Neutrophils Absolute 3.5 1.4 - 7.0 x10E3/uL   Lymphocytes Absolute 2.0 0.7 - 3.1 x10E3/uL   Monocytes Absolute 0.6 0.1 - 0.9 x10E3/uL   EOS (ABSOLUTE) 0.2 0.0 - 0.4 x10E3/uL   Basophils Absolute 0.0 0.0 - 0.2 x10E3/uL   Immature Granulocytes 0 Not Estab. %   Immature Grans (Abs) 0.0 0.0 - 0.1 x10E3/uL  Comprehensive metabolic panel with GFR   Collection Time: 10/31/24  9:19 AM  Result Value Ref Range   Glucose 89 70 - 99 mg/dL   BUN 16 6 - 24 mg/dL   Creatinine, Ser 9.14 0.76 - 1.27 mg/dL   eGFR 892 >40 fO/fpw/8.26   BUN/Creatinine Ratio 19 9 - 20   Sodium 142 134 - 144 mmol/L   Potassium 4.8 3.5 - 5.2 mmol/L   Chloride 101 96 - 106 mmol/L   CO2 25 20 - 29 mmol/L   Calcium 9.4 8.7 - 10.2 mg/dL   Total Protein 6.7 6.0 - 8.5 g/dL    Albumin 4.5  4.1 - 5.1 g/dL   Globulin, Total 2.2 1.5 - 4.5 g/dL   Bilirubin Total 0.7 0.0 - 1.2 mg/dL   Alkaline Phosphatase 52 47 - 123 IU/L   AST 19 0 - 40 IU/L   ALT 46 (H) 0 - 44 IU/L      Assessment & Plan:   Problem List Items Addressed This Visit   None Visit Diagnoses       Acute cough    -  Primary   Relevant Orders   Veritor Flu A/B Waived     Sore throat       Relevant Orders   POC COVID-19        Follow up plan: No follow-ups on file.      "

## 2025-01-10 ENCOUNTER — Ambulatory Visit
